# Patient Record
Sex: Female | Born: 1950
Health system: Southern US, Community
[De-identification: ages and names within clinical notes are randomized; demographics above are authoritative.]

## PROBLEM LIST (undated history)

## (undated) DIAGNOSIS — E109 Type 1 diabetes mellitus without complications: Secondary | ICD-10-CM

## (undated) DIAGNOSIS — B009 Herpesviral infection, unspecified: Secondary | ICD-10-CM

## (undated) DIAGNOSIS — Z800 Family history of malignant neoplasm of digestive organs: Secondary | ICD-10-CM

## (undated) DIAGNOSIS — E7800 Pure hypercholesterolemia, unspecified: Secondary | ICD-10-CM

## (undated) DIAGNOSIS — D126 Benign neoplasm of colon, unspecified: Secondary | ICD-10-CM

## (undated) DIAGNOSIS — K769 Liver disease, unspecified: Secondary | ICD-10-CM

## (undated) DIAGNOSIS — I10 Essential (primary) hypertension: Secondary | ICD-10-CM

## (undated) DIAGNOSIS — E785 Hyperlipidemia, unspecified: Secondary | ICD-10-CM

## (undated) DIAGNOSIS — Z780 Asymptomatic menopausal state: Secondary | ICD-10-CM

## (undated) DIAGNOSIS — M1990 Unspecified osteoarthritis, unspecified site: Secondary | ICD-10-CM

## (undated) HISTORY — DX: Asymptomatic menopausal state: Z78.0

## (undated) HISTORY — DX: Hyperlipidemia, unspecified: E78.5

## (undated) HISTORY — DX: Herpesviral infection, unspecified: B00.9

## (undated) HISTORY — DX: Essential (primary) hypertension: I10

## (undated) HISTORY — DX: Family history of malignant neoplasm of digestive organs: Z80.0

## (undated) HISTORY — DX: Benign neoplasm of colon, unspecified: D12.6

## (undated) HISTORY — DX: Pure hypercholesterolemia, unspecified: E78.00

## (undated) HISTORY — DX: Type 1 diabetes mellitus without complications: E10.9

## (undated) HISTORY — PX: ABDOMINAL HYSTERECTOMY: SHX81

## (undated) HISTORY — DX: Liver disease, unspecified: K76.9

## (undated) HISTORY — DX: Unspecified osteoarthritis, unspecified site: M19.90

---

## 1999-01-27 HISTORY — PX: CHOLECYSTECTOMY: SHX55

## 1999-07-10 ENCOUNTER — Other Ambulatory Visit: Admission: RE | Admit: 1999-07-10 | Discharge: 1999-07-10 | Payer: Self-pay | Admitting: *Deleted

## 2001-01-20 ENCOUNTER — Other Ambulatory Visit: Admission: RE | Admit: 2001-01-20 | Discharge: 2001-01-20 | Payer: Self-pay | Admitting: *Deleted

## 2002-01-16 ENCOUNTER — Other Ambulatory Visit: Admission: RE | Admit: 2002-01-16 | Discharge: 2002-01-16 | Payer: Self-pay | Admitting: *Deleted

## 2003-01-15 ENCOUNTER — Other Ambulatory Visit: Admission: RE | Admit: 2003-01-15 | Discharge: 2003-01-15 | Payer: Self-pay | Admitting: *Deleted

## 2004-01-02 ENCOUNTER — Ambulatory Visit: Payer: Self-pay | Admitting: Endocrinology

## 2004-01-24 ENCOUNTER — Other Ambulatory Visit: Admission: RE | Admit: 2004-01-24 | Discharge: 2004-01-24 | Payer: Self-pay | Admitting: *Deleted

## 2004-03-27 ENCOUNTER — Ambulatory Visit: Payer: Self-pay | Admitting: Endocrinology

## 2004-04-17 ENCOUNTER — Ambulatory Visit: Payer: Self-pay | Admitting: Endocrinology

## 2004-07-28 ENCOUNTER — Ambulatory Visit: Payer: Self-pay | Admitting: Endocrinology

## 2004-12-22 ENCOUNTER — Ambulatory Visit: Payer: Self-pay | Admitting: Endocrinology

## 2005-01-22 ENCOUNTER — Other Ambulatory Visit: Admission: RE | Admit: 2005-01-22 | Discharge: 2005-01-22 | Payer: Self-pay | Admitting: *Deleted

## 2005-01-30 ENCOUNTER — Ambulatory Visit: Payer: Self-pay | Admitting: Endocrinology

## 2005-03-26 ENCOUNTER — Ambulatory Visit: Payer: Self-pay | Admitting: Endocrinology

## 2005-12-04 ENCOUNTER — Ambulatory Visit: Payer: Self-pay | Admitting: Endocrinology

## 2006-02-09 ENCOUNTER — Ambulatory Visit: Payer: Self-pay | Admitting: Endocrinology

## 2006-02-09 LAB — CONVERTED CEMR LAB
AST: 25 units/L (ref 0–37)
Basophils Relative: 0.6 % (ref 0.0–1.0)
CO2: 26 meq/L (ref 19–32)
Cholesterol: 145 mg/dL (ref 0–200)
Creatinine, Ser: 0.8 mg/dL (ref 0.4–1.2)
Glucose, Bld: 235 mg/dL — ABNORMAL HIGH (ref 70–99)
HCT: 42 % (ref 36.0–46.0)
HDL: 67.7 mg/dL (ref >39.0)
Leukocytes, UA: NEGATIVE
Monocytes Absolute: 0.5 10*3/uL (ref 0.2–0.7)
Monocytes Relative: 9 % (ref 3.0–11.0)
Neutrophils Relative %: 64.9 % (ref 43.0–77.0)
Platelets: 302 10*3/uL (ref 150–400)
Potassium: 4.2 meq/L (ref 3.5–5.1)
Specific Gravity, Urine: >=1.03 (ref 1.000–1.03)
TSH: 2.44 microintl units/mL (ref 0.35–5.50)
Total Protein: 6.1 g/dL (ref 6.0–8.3)
VLDL: 12 mg/dL (ref 0–40)
WBC: 6 10*3/uL (ref 4.5–10.5)
pH: 6 (ref 5.0–8.0)

## 2006-02-10 ENCOUNTER — Ambulatory Visit: Payer: Self-pay | Admitting: Endocrinology

## 2006-02-10 LAB — CONVERTED CEMR LAB
Hgb A1c MFr Bld: 8.7 % — ABNORMAL HIGH (ref 4.6–6.0)
Microalb, Ur: 0.2 mg/dL (ref 0.0–1.9)

## 2006-05-14 ENCOUNTER — Ambulatory Visit: Payer: Self-pay | Admitting: Endocrinology

## 2006-05-14 LAB — CONVERTED CEMR LAB: Hgb A1c MFr Bld: 8.6 % — ABNORMAL HIGH (ref 4.6–6.0)

## 2006-05-19 ENCOUNTER — Ambulatory Visit: Payer: Self-pay | Admitting: Endocrinology

## 2006-08-24 ENCOUNTER — Encounter: Payer: Self-pay | Admitting: Endocrinology

## 2006-08-24 DIAGNOSIS — I10 Essential (primary) hypertension: Secondary | ICD-10-CM

## 2006-08-24 DIAGNOSIS — E109 Type 1 diabetes mellitus without complications: Secondary | ICD-10-CM | POA: Insufficient documentation

## 2006-08-24 HISTORY — DX: Essential (primary) hypertension: I10

## 2006-08-24 HISTORY — DX: Type 1 diabetes mellitus without complications: E10.9

## 2006-09-30 ENCOUNTER — Ambulatory Visit: Payer: Self-pay | Admitting: Endocrinology

## 2007-01-26 ENCOUNTER — Encounter: Payer: Self-pay | Admitting: Endocrinology

## 2007-04-08 ENCOUNTER — Ambulatory Visit: Payer: Self-pay | Admitting: Endocrinology

## 2007-04-08 LAB — CONVERTED CEMR LAB: Hgb A1c MFr Bld: 8.1 % — ABNORMAL HIGH (ref 4.6–6.0)

## 2007-04-18 ENCOUNTER — Encounter: Payer: Self-pay | Admitting: Endocrinology

## 2008-01-27 HISTORY — PX: ACHILLES TENDON REPAIR: SUR1153

## 2008-02-09 ENCOUNTER — Ambulatory Visit: Payer: Self-pay | Admitting: Endocrinology

## 2008-02-09 DIAGNOSIS — E7800 Pure hypercholesterolemia, unspecified: Secondary | ICD-10-CM

## 2008-02-09 HISTORY — DX: Pure hypercholesterolemia, unspecified: E78.00

## 2008-02-14 ENCOUNTER — Telehealth: Payer: Self-pay | Admitting: Endocrinology

## 2008-02-16 ENCOUNTER — Telehealth: Payer: Self-pay | Admitting: Endocrinology

## 2008-03-16 ENCOUNTER — Telehealth (INDEPENDENT_AMBULATORY_CARE_PROVIDER_SITE_OTHER): Payer: Self-pay | Admitting: *Deleted

## 2008-04-30 ENCOUNTER — Ambulatory Visit: Payer: Self-pay | Admitting: Endocrinology

## 2008-04-30 LAB — CONVERTED CEMR LAB
Alkaline Phosphatase: 70 units/L (ref 39–117)
Basophils Absolute: 0 10*3/uL (ref 0.0–0.1)
Bilirubin, Direct: 0.1 mg/dL (ref 0.0–0.3)
Creatinine, Ser: 0.8 mg/dL (ref 0.4–1.2)
Eosinophils Absolute: 0.3 10*3/uL (ref 0.0–0.7)
Eosinophils Relative: 3.7 % (ref 0.0–5.0)
GFR calc non Af Amer: 78.95 mL/min (ref >60)
HCT: 40.8 % (ref 36.0–46.0)
HDL: 66 mg/dL (ref >39.00)
Hemoglobin, Urine: NEGATIVE
Leukocytes, UA: NEGATIVE
MCHC: 33.6 g/dL (ref 30.0–36.0)
MCV: 90.8 fL (ref 78.0–100.0)
Monocytes Absolute: 0.6 10*3/uL (ref 0.1–1.0)
Neutro Abs: 4.9 10*3/uL (ref 1.4–7.7)
Platelets: 242 10*3/uL (ref 150.0–400.0)
RBC: 4.49 M/uL (ref 3.87–5.11)
Specific Gravity, Urine: >=1.03 (ref 1.000–1.030)
TSH: 1.71 microintl units/mL (ref 0.35–5.50)
VLDL: 19 mg/dL (ref 0.0–40.0)
WBC: 7 10*3/uL (ref 4.5–10.5)
pH: 5.5 (ref 5.0–8.0)

## 2008-05-02 ENCOUNTER — Ambulatory Visit: Payer: Self-pay | Admitting: Endocrinology

## 2008-07-11 ENCOUNTER — Encounter: Payer: Self-pay | Admitting: Endocrinology

## 2008-08-06 ENCOUNTER — Ambulatory Visit: Payer: Self-pay | Admitting: Gastroenterology

## 2008-09-06 ENCOUNTER — Encounter: Payer: Self-pay | Admitting: Gastroenterology

## 2008-09-06 ENCOUNTER — Ambulatory Visit: Payer: Self-pay | Admitting: Gastroenterology

## 2008-09-10 ENCOUNTER — Encounter: Payer: Self-pay | Admitting: Gastroenterology

## 2009-03-13 ENCOUNTER — Encounter: Payer: Self-pay | Admitting: Endocrinology

## 2009-03-13 ENCOUNTER — Ambulatory Visit: Payer: Self-pay | Admitting: Endocrinology

## 2009-03-13 DIAGNOSIS — R059 Cough, unspecified: Secondary | ICD-10-CM | POA: Insufficient documentation

## 2009-03-13 DIAGNOSIS — R05 Cough: Secondary | ICD-10-CM

## 2009-03-13 DIAGNOSIS — B009 Herpesviral infection, unspecified: Secondary | ICD-10-CM

## 2009-03-13 HISTORY — DX: Herpesviral infection, unspecified: B00.9

## 2009-03-13 LAB — CONVERTED CEMR LAB: Hgb A1c MFr Bld: 8.7 % — ABNORMAL HIGH (ref 4.6–6.5)

## 2009-03-14 ENCOUNTER — Telehealth (INDEPENDENT_AMBULATORY_CARE_PROVIDER_SITE_OTHER): Payer: Self-pay | Admitting: *Deleted

## 2009-03-21 ENCOUNTER — Ambulatory Visit: Payer: Self-pay | Admitting: Endocrinology

## 2009-04-11 ENCOUNTER — Ambulatory Visit: Payer: Self-pay | Admitting: Endocrinology

## 2009-04-12 ENCOUNTER — Encounter: Payer: Self-pay | Admitting: Endocrinology

## 2009-05-21 ENCOUNTER — Ambulatory Visit: Payer: Self-pay | Admitting: Endocrinology

## 2009-05-21 DIAGNOSIS — Z780 Asymptomatic menopausal state: Secondary | ICD-10-CM | POA: Insufficient documentation

## 2009-05-21 HISTORY — DX: Asymptomatic menopausal state: Z78.0

## 2009-08-13 ENCOUNTER — Encounter: Payer: Self-pay | Admitting: Endocrinology

## 2009-08-13 ENCOUNTER — Ambulatory Visit: Payer: Self-pay | Admitting: Internal Medicine

## 2009-09-27 ENCOUNTER — Encounter: Payer: Self-pay | Admitting: Endocrinology

## 2009-11-08 ENCOUNTER — Encounter: Payer: Self-pay | Admitting: Endocrinology

## 2009-12-06 ENCOUNTER — Ambulatory Visit: Payer: Self-pay | Admitting: Endocrinology

## 2010-01-23 ENCOUNTER — Encounter: Payer: Self-pay | Admitting: Endocrinology

## 2010-02-10 ENCOUNTER — Encounter: Payer: Self-pay | Admitting: Endocrinology

## 2010-02-11 ENCOUNTER — Encounter: Payer: Self-pay | Admitting: Endocrinology

## 2010-02-23 LAB — CONVERTED CEMR LAB
ALT: 25 units/L (ref 0–35)
AST: 24 units/L (ref 0–37)
Alkaline Phosphatase: 101 units/L (ref 39–117)
BUN: 15 mg/dL (ref 6–23)
Basophils Absolute: 0 10*3/uL (ref 0.0–0.1)
Bilirubin, Direct: 0.1 mg/dL (ref 0.0–0.3)
Calcium: 9.4 mg/dL (ref 8.4–10.5)
Creatinine, Ser: 0.7 mg/dL (ref 0.4–1.2)
Creatinine,U: 105 mg/dL
Direct LDL: 130.2 mg/dL
GFR calc non Af Amer: 91.76 mL/min (ref >60)
Glucose, Bld: 104 mg/dL — ABNORMAL HIGH (ref 70–99)
HDL: 77.4 mg/dL (ref >39.00)
Hemoglobin: 13.9 g/dL (ref 12.0–15.0)
Ketones, ur: NEGATIVE mg/dL
Leukocytes, UA: NEGATIVE
Lymphocytes Relative: 22.3 % (ref 12.0–46.0)
Lymphs Abs: 1.5 10*3/uL (ref 0.7–4.0)
MCHC: 34.4 g/dL (ref 30.0–36.0)
Microalb Creat Ratio: 2.9 mg/g (ref 0.0–30.0)
Monocytes Absolute: 0.6 10*3/uL (ref 0.1–1.0)
Neutro Abs: 4.3 10*3/uL (ref 1.4–7.7)
Potassium: 4.9 meq/L (ref 3.5–5.1)
RBC: 4.52 M/uL (ref 3.87–5.11)
Specific Gravity, Urine: 1.025 (ref 1.000–1.030)
Total Bilirubin: 0.6 mg/dL (ref 0.3–1.2)
Triglycerides: 137 mg/dL (ref 0.0–149.0)
Urobilinogen, UA: 0.2 (ref 0.0–1.0)
VLDL: 27.4 mg/dL (ref 0.0–40.0)
WBC: 6.8 10*3/uL (ref 4.5–10.5)
pH: 6.5 (ref 5.0–8.0)

## 2010-02-27 NOTE — Letter (Signed)
Summary: [facility]   Imported By: Lennie Odor 11/14/2009 11:52:26  _____________________________________________________________________  External Attachment:    Type:   Image     Comment:   External Document

## 2010-02-27 NOTE — Assessment & Plan Note (Signed)
Summary: 2-3 wk fu--per pt late pm/work---stc   Vital Signs:  Patient profile:   60 year old female Height:      65 inches (165.10 cm) Weight:      265.13 pounds (120.51 kg) O2 Sat:      97 % on Room air Temp:     97.5 degrees F (36.39 degrees C) oral Pulse rate:   83 / minute BP sitting:   140 / 80  (left arm) Cuff size:   large  Vitals Entered By: Josph Macho RMA (April 11, 2009 3:56 PM)  O2 Flow:  Room air CC: 2-3 week follow up/ pt states she does not use the freestyle test strips, and is not taking Tamiflu or Hydrocodone anymore/ CF Is Patient Diabetic? Yes   Referring Provider:  Romero Belling, MD Primary Provider:  Romero Belling, MD  CC:  2-3 week follow up/ pt states she does not use the freestyle test strips and and is not taking Tamiflu or Hydrocodone anymore/ CF.  History of Present Illness: pt states she feels well in general.  she brings a record of her cbg's which i have reviewed today.  she has frequest mild hypoglycemia before lunch, and before evening meal.  it is still highest in am, when it is sometimes over 200.  Current Medications (verified): 1)  Humalog 100 Unit/ml  Soln (Insulin Lispro (Human)) .... Three Times A Day (Just Before Each Meal) 35-15 25 Units 2)  Adult Aspirin Low Strength 81 Mg  Tbdp (Aspirin) .... Take 1 By Mouth Once Daily 3)  Lisinopril 5 Mg  Tabs (Lisinopril) .... Take 1 By Mouth Once Daily 4)  Lantus 100 Unit/ml  Soln (Insulin Glargine) .... 25 Units Two Times A Day 5)  Freestyle Test   Strp (Glucose Blood) .... Use A Strip To Check Blood Sugars Qid 6)  Accu-Chek Comfort Curve   Strp (Glucose Blood) .... Use 1 Strip To Check Glucose Qid Qd 7)  Simvastatin 80 Mg Tabs (Simvastatin) .... One Tablet By Mouth At Bedtime 8)  Colestipol Hcl 1 Gm Tabs (Colestipol Hcl) .... 3 Tabs Once Daily With Meals 9)  Lotrisone 1-0.05 % Crea (Clotrimazole-Betamethasone) .... Three Times A Day As Needed Rash 10)  Tamiflu 11)  Diclofenac Sodium 75 Mg Tbec  (Diclofenac Sodium) .Marland Kitchen.. 1 Tab By Mouth Two Times A Day 12)  Hydrocodone-Homatropine 5-1.5 Mg/45ml Syrp (Hydrocodone-Homatropine) .Marland Kitchen.. 1 Teaspoon By Mouth 4 Times A Day  Allergies (verified): 1)  ! * Actos  Past History:  Past Medical History: Last updated: 08/06/2008 Diabetes mellitus, type II Hypertension Dyslipidemia  Arthritis Hyperlipidemia  Review of Systems  The patient denies syncope.    Physical Exam  General:  obese.   Skin:  insulin injection sites at anterior abdomen are normal    Impression & Recommendations:  Problem # 1:  DIABETES MELLITUS, TYPE I (ICD-250.01) needs adjustments.  Medications Added to Medication List This Visit: 1)  Humalog 100 Unit/ml Soln (Insulin lispro (human)) .... Three times a day (just before each meal) 30-10-30 units 2)  Lantus 100 Unit/ml Soln (Insulin glargine) .... 50 units at bedtime 3)  Accu-chek Comfort Curve Strp (Glucose blood) .... Three times a day, and lancets 250.01 4)  Bd Insulin Syr Ultrafine Ii 31g X 5/16" 0.5 Ml Misc (Insulin syringe-needle u-100) .... Any brand, 4/day  Other Orders: Est. Patient Level III (27782)  Patient Instructions: 1)  reduce humalog to (just before each meal) 30-10-30 units 2)  change lantus to 50 units at  night. 3)  check your blood sugar 2 times a day.  vary the time of day when you check, between before the 3 meals, and at bedtime.  also check if you have symptoms of your blood sugar being too high or too low.  please keep a record of the readings and bring it to your next appointment here.  please call us sooner if you are having low blood sugar episodes.  4)  physical in 1-2 months, with blood tests same day--i'll order. Prescriptions: LOTRISONE 1-0.05 % CREA (CLOTRIMAZOLE-BETAMETHASONE) three times a day as needed rash  #1 lg tube x 3   Entered and Authorized by:   Minus Breeding MD   Signed by:   Minus Breeding MD on 04/11/2009   Method used:   Electronically to        [facility]* (mail-order)             ,          Ph: [phone]       Fax: [phone]   RxID:   [id number] BD INSULIN SYR ULTRAFINE II 31G X 5/16" 0.5 ML MISC (INSULIN SYRINGE-NEEDLE U-100) any brand, 4/day  #360 x 3   Entered and Authorized by:   Minus Breeding MD   Signed by:   Minus Breeding MD on 04/11/2009   Method used:   Electronically to        [facility]* (mail-order)             ,          Ph: [phone]       Fax: [phone]   RxID:   [id number] DICLOFENAC SODIUM 75 MG TBEC (DICLOFENAC SODIUM) 1 tab by mouth two times a day  #180 x 3   Entered and Authorized by:   Minus Breeding MD   Signed by:   Minus Breeding MD on 04/11/2009   Method used:   Electronically to        [facility]* (mail-order)             ,          Ph: [phone]       Fax: [phone]   RxID:   [id number] COLESTIPOL HCL 1 GM TABS (COLESTIPOL HCL) 3 tabs once daily with meals  #270 x 3   Entered and Authorized by:   Minus Breeding MD   Signed by:   Minus Breeding MD on 04/11/2009   Method used:   Electronically to        [facility]* (mail-order)             ,          Ph: [phone]       Fax: [phone]   RxID:   [id number] SIMVASTATIN 80 MG TABS (SIMVASTATIN) one tablet by mouth at bedtime  #90 x 3   Entered and Authorized by:   Minus Breeding MD   Signed by:   Minus Breeding MD on 04/11/2009   Method used:   Electronically to        [facility]* (mail-order)             ,          Ph: [phone]       Fax: [phone]   RxID:   [id number] ACCU-CHEK COMFORT CURVE   STRP (GLUCOSE BLOOD) three times a day, and lancets 250.01  #  270 x 3   Entered and Authorized by:   Minus Breeding MD   Signed by:   Minus Breeding MD on 04/11/2009   Method used:   Electronically to        [facility]* (mail-order)             ,          Ph: [phone]       Fax: [phone]   RxID:   [id number] LANTUS 100 UNIT/ML  SOLN (INSULIN GLARGINE) 50 units  at bedtime  #5 vials x 3   Entered and Authorized by:   Minus Breeding MD   Signed by:   Minus Breeding MD on 04/11/2009   Method used:   Electronically to        [facility]* (mail-order)             ,          Ph: [phone]       Fax: [phone]   RxID:   [id number] LISINOPRIL 5 MG  TABS (LISINOPRIL) take 1 by mouth once daily  #90 x 3   Entered and Authorized by:   Minus Breeding MD   Signed by:   Minus Breeding MD on 04/11/2009   Method used:   Electronically to        [facility]* (mail-order)             ,          Ph: [phone]       Fax: [phone]   RxID:   [id number] HUMALOG 100 UNIT/ML  SOLN (INSULIN LISPRO (HUMAN)) three times a day (just before each meal) 30-10-30 units  #7 vials x 3   Entered and Authorized by:   Minus Breeding MD   Signed by:   Minus Breeding MD on 04/11/2009   Method used:   Electronically to        [facility]* (mail-order)             ,          Ph: [phone]       Fax: [phone]   RxID:   [id number]

## 2010-02-27 NOTE — Miscellaneous (Signed)
[facility]   Imported By: Lester Blevins 04/19/2009 10:22:07  _____________________________________________________________________  External Attachment:    Type:   Image     Comment:   External Document

## 2010-02-27 NOTE — Miscellaneous (Signed)
Summary: BONE DENSITY  Clinical Lists Changes  Orders: Added new Test order of T-Lumbar Vertebral Assessment (77082) - Signed 

## 2010-02-27 NOTE — Miscellaneous (Signed)
  Medications Added ACYCLOVIR 200 MG CAPS (ACYCLOVIR) 4 tabs 5x a day       Clinical Lists Changes  Problems: Added new problem of HERPES LABIALIS (ICD-054.9) Medications: Added new medication of ACYCLOVIR 200 MG CAPS (ACYCLOVIR) 4 tabs 5x a day - Signed Rx of ACYCLOVIR 200 MG CAPS (ACYCLOVIR) 4 tabs 5x a day;  #90 x 0;  Signed;  Entered by: Minus Breeding MD;  Authorized by: Minus Breeding MD;  Method used: Electronically to [facility] [address], [place], [place], [place]  [address], Ph: [phone] or [phone], Fax: [phone]    Prescriptions: ACYCLOVIR 200 MG CAPS (ACYCLOVIR) 4 tabs 5x a day  #90 x 0   Entered and Authorized by:   Minus Breeding MD   Signed by:   Minus Breeding MD on 03/13/2009   Method used:   Electronically to        [facility] [address] (retail)       [address]       [place], [place]  [address]       Ph: [phone] or [phone]       Fax: [phone]   RxID:   [id number]

## 2010-02-27 NOTE — Assessment & Plan Note (Signed)
Summary: FU--O K / DRIVER'S LICENSE FORM--STC   Vital Signs:  Patient profile:   60 year old female Height:      65 inches (165.10 cm) Weight:      260 pounds (118.18 kg) O2 Sat:      96 % on Room air Temp:     98.4 degrees F (36.89 degrees C) oral Pulse rate:   101 / minute BP sitting:   142 / 62  (left arm) Cuff size:   large  Vitals Entered By: Josph Macho RMA (March 13, 2009 9:29 AM)  O2 Flow:  Room air CC: Follow-up visit/ pt has papers for DMV/ pt states her throat has been sore since Sunday/ CF Is Patient Diabetic? Yes   Referring Provider:  Romero Belling, MD Primary Provider:  Romero Belling, MD  CC:  Follow-up visit/ pt has papers for DMV/ pt states her throat has been sore since Sunday/ CF.  History of Present Illness: no cbg record, but states cbg's are "high"--highest before supper, and lowest at lunch.  she says it is mostly 200-300's. pt states few mos of nasal congestion, and asociated pain at both ears.  she went to urgent care 3d ago, and was rx'ed tamiflu and cough syrup.  Current Medications (verified): 1)  Humalog 100 Unit/ml  Soln (Insulin Lispro (Human)) .... Qac 4 Times A Day) 25-20-25-5 Units 2)  Adult Aspirin Low Strength 81 Mg  Tbdp (Aspirin) .... Take 1 By Mouth Once Daily 3)  Lisinopril 5 Mg  Tabs (Lisinopril) .... Take 1 By Mouth Once Daily 4)  Lantus 100 Unit/ml  Soln (Insulin Glargine) .... 25 Units Bid 5)  Freestyle Test   Strp (Glucose Blood) .... Use A Strip To Check Blood Sugars Qid 6)  Accu-Chek Comfort Curve   Strp (Glucose Blood) .... Use 1 Strip To Check Glucose Qid Qd 7)  Simvastatin 80 Mg Tabs (Simvastatin) .... One Tablet By Mouth At Bedtime 8)  Colestipol Hcl 1 Gm Tabs (Colestipol Hcl) .... 3 Tabs Once Daily With Meals 9)  Lotrisone 1-0.05 % Crea (Clotrimazole-Betamethasone) .... Three Times A Day As Needed Rash 10)  Tamiflu 11)  Diclofenac Sodium 75 Mg Tbec (Diclofenac Sodium) .Marland Kitchen.. 1 Tab By Mouth Two Times A Day 12)   Hydrocodone-Homatropine 5-1.5 Mg/82ml Syrp (Hydrocodone-Homatropine) .Marland Kitchen.. 1 Teaspoon By Mouth 4 Times A Day  Allergies (verified): 1)  ! * Actos  Past History:  Past Medical History: Last updated: 08/06/2008 Diabetes mellitus, type II Hypertension Dyslipidemia  Arthritis Hyperlipidemia  Review of Systems  The patient denies dyspnea on exertion.         she has dry cough.  fever is better.  no hypoglycemia.  Physical Exam  General:  morbidly obese.  no distress  Head:  head: no deformity eyes: no periorbital swelling, no proptosis external nose and ears are normal mouth: no lesion seen Ears:  both tm's are very red Neck:  supple Lungs:  Clear to auscultation bilaterally. Normal respiratory effort.    Impression & Recommendations:  Problem # 1:  uri  Problem # 2:  DIABETES MELLITUS, TYPE I (ICD-250.01) needs increased rx  Problem # 3:  HYPERTENSION (ICD-401.9) with ? of situational component  Medications Added to Medication List This Visit: 1)  Humalog 100 Unit/ml Soln (Insulin lispro (human)) .... 35 units three times a day (just before each meal) 2)  Tamiflu  3)  Diclofenac Sodium 75 Mg Tbec (Diclofenac sodium) .Marland Kitchen.. 1 tab by mouth two times a day 4)  Hydrocodone-homatropine 5-1.5 Mg/40ml Syrp (Hydrocodone-homatropine) .Marland Kitchen.. 1 teaspoon by mouth 4 times a day 5)  Cefuroxime Axetil 250 Mg Tabs (Cefuroxime axetil) .Marland Kitchen.. 1 two times a day  Other Orders: T-2 View CXR (71020TC) TLB-A1C / Hgb A1C (Glycohemoglobin) (83036-A1C) Est. Patient Level IV ([address])  Patient Instructions: 1)  cefuroxime 250 mg two times a day. 2)  chest x ray and blood test today. 3)  continue cough syrup. 4)  increase humalog to 35 units three times a day (just before each meal).   5)  same lantus. 6)  check your blood sugar 2 times a day.  vary the time of day when you check, between before the 3 meals, and at bedtime.  also check if you have symptoms of your blood sugar being too high or too  low.  please keep a record of the readings and bring it to your next appointment here.  please call us sooner if you are having low blood sugar episodes. 7)  return 1-2 weeks. 8)  we'll recheck blood pressure then. 9)  loratadine-d (non-prescription) as needed for congestion. Prescriptions: CEFUROXIME AXETIL 250 MG TABS (CEFUROXIME AXETIL) 1 two times a day  #14 x 0   Entered and Authorized by:   Minus Breeding MD   Signed by:   Minus Breeding MD on 03/13/2009   Method used:   Electronically to        [facility] [address] (retail)       [address]       [place], [place]  [address]       Ph: [phone] or [phone]       Fax: [phone]   RxID:   [id number]   Preventive Care Screening  Last Flu Shot:    Date:  10/26/2008    Results:  historical

## 2010-02-27 NOTE — Miscellaneous (Signed)
Summary: BONE DENSITY  Clinical Lists Changes 

## 2010-02-27 NOTE — Assessment & Plan Note (Signed)
Summary: flu shot/sae/cd---8am appt/cd   Nurse Visit   Allergies: 1)  ! * Actos  Orders Added: 1)  Admin 1st Vaccine [90471] 2)  Flu Vaccine 22yrs + [65784]   Flu Vaccine Consent Questions     Do you have a history of severe allergic reactions to this vaccine? no    Any prior history of allergic reactions to egg and/or gelatin? no    Do you have a sensitivity to the preservative Thimersol? no    Do you have a past history of Guillan-Barre Syndrome? no    Do you currently have an acute febrile illness? no    Have you ever had a severe reaction to latex? no    Vaccine information given and explained to patient? yes    Are you currently pregnant? no    Lot Number:[id number]   Exp Date:07/26/2010   Site Given  Left Deltoid IM

## 2010-02-27 NOTE — Assessment & Plan Note (Signed)
Summary: 1-2 WK FU---STC   Vital Signs:  Patient profile:   60 year old female Height:      65 inches (165.10 cm) Weight:      259 pounds (117.73 kg) O2 Sat:      98 % on Room air Temp:     96.2 degrees F (35.67 degrees C) oral Pulse rate:   84 / minute BP sitting:   138 / 86  (left arm) Cuff size:   large  Vitals Entered By: Josph Macho RMA (March 21, 2009 3:42 PM)  O2 Flow:  Room air CC: 1-2 week follow up/ CF Is Patient Diabetic? Yes   Referring Provider:  Romero Belling, MD Primary Provider:  Romero Belling, MD  CC:  1-2 week follow up/ CF.  History of Present Illness: pt says she is feeling much better since her recent respiratory infection.  she had hypoglycemia in the afternoon with 35 units humalog (just before each meal).  she reduced to (just before each meal) 30-15-30 units.  on this schedule, she has not had any more hypoglycemia.  Current Medications (verified): 1)  Humalog 100 Unit/ml  Soln (Insulin Lispro (Human)) .... 35 Units Three Times A Day (Just Before Each Meal) 2)  Adult Aspirin Low Strength 81 Mg  Tbdp (Aspirin) .... Take 1 By Mouth Once Daily 3)  Lisinopril 5 Mg  Tabs (Lisinopril) .... Take 1 By Mouth Once Daily 4)  Lantus 100 Unit/ml  Soln (Insulin Glargine) .... 25 Units Bid 5)  Freestyle Test   Strp (Glucose Blood) .... Use A Strip To Check Blood Sugars Qid 6)  Accu-Chek Comfort Curve   Strp (Glucose Blood) .... Use 1 Strip To Check Glucose Qid Qd 7)  Simvastatin 80 Mg Tabs (Simvastatin) .... One Tablet By Mouth At Bedtime 8)  Colestipol Hcl 1 Gm Tabs (Colestipol Hcl) .... 3 Tabs Once Daily With Meals 9)  Lotrisone 1-0.05 % Crea (Clotrimazole-Betamethasone) .... Three Times A Day As Needed Rash 10)  Tamiflu 11)  Diclofenac Sodium 75 Mg Tbec (Diclofenac Sodium) .Marland Kitchen.. 1 Tab By Mouth Two Times A Day 12)  Hydrocodone-Homatropine 5-1.5 Mg/69ml Syrp (Hydrocodone-Homatropine) .Marland Kitchen.. 1 Teaspoon By Mouth 4 Times A Day 13)  Cefuroxime Axetil 250 Mg Tabs  (Cefuroxime Axetil) .Marland Kitchen.. 1 Two Times A Day 14)  Acyclovir 200 Mg Caps (Acyclovir) .... 4 Tabs 5x A Day  Allergies (verified): 1)  ! * Actos  Past History:  Past Medical History: Last updated: 08/06/2008 Diabetes mellitus, type II Hypertension Dyslipidemia  Arthritis Hyperlipidemia  Review of Systems  The patient denies hypoglycemia.    Physical Exam  General:  obese.  no distress.   Psych:  Alert and cooperative; normal mood and affect; normal attention span and concentration.     Impression & Recommendations:  Problem # 1:  DIABETES MELLITUS, TYPE I (ICD-250.01) Assessment Improved  Medications Added to Medication List This Visit: 1)  Humalog 100 Unit/ml Soln (Insulin lispro (human)) .... Three times a day (just before each meal) 35-15 25 units 2)  Lantus 100 Unit/ml Soln (Insulin glargine) .... 25 units two times a day  Other Orders: Est. Patient Level III ([address])  Patient Instructions: 1)  change humalog to (just before each meal) 35-15-25 units 2)  for now, continue lantus 25 units 2x a day. 3)  check your blood sugar 2 times a day.  vary the time of day when you check, between before the 3 meals, and at bedtime.  also check if you have symptoms of  your blood sugar being too high or too low.  please keep a record of the readings and bring it to your next appointment here.  please call us sooner if you are having low blood sugar episodes.  it is very important to check more at bedtime. 4)  return 2-3 weeks. 5)  i told pt we will need to take this complex situation in stages.

## 2010-02-27 NOTE — Progress Notes (Signed)
Summary: DMV  Phone Note Outgoing Call   Summary of Call: Mailed completed paperwork to [employer] [facility] [employer]. Sent a copy to be scanned also. Initial call taken by: Josph Macho RMA,  March 14, 2009 9:10 AM

## 2010-02-27 NOTE — Miscellaneous (Signed)
Summary: FREESTYLE STRIPS  Medications Added FREESTYLE TEST   STRP (GLUCOSE BLOOD) USE A STRIP TO CHECK BLOOD SUGARS QID       Clinical Lists Changes  Medications: Added new medication of FREESTYLE TEST   STRP (GLUCOSE BLOOD) USE A STRIP TO CHECK BLOOD SUGARS QID - Signed Rx of FREESTYLE TEST   STRP (GLUCOSE BLOOD) USE A STRIP TO CHECK BLOOD SUGARS QID;  #150 x 6;  Signed;  Entered by: Orlan Leavens;  Authorized by: Minus Breeding MD;  Method used: Electronic    Prescriptions: FREESTYLE TEST   STRP (GLUCOSE BLOOD) USE A STRIP TO CHECK BLOOD SUGARS QID  #150 x 6   Entered by:   Orlan Leavens   Authorized by:   Minus Breeding MD   Signed by:   Orlan Leavens on 01/26/2007   Method used:   Electronically sent to ...       [facility] [address]       [address]       [place], [place]  [address]       Ph: [phone] or [phone]       Fax: [phone]   RxID:   [id number]

## 2010-02-27 NOTE — Assessment & Plan Note (Signed)
Summary: 1-2 MTH PHYSICAL  D/T--STC   Vital Signs:  Patient profile:   60 year old female Height:      65 inches (165.10 cm) Weight:      267.38 pounds (121.54 kg) BMI:     44.66 O2 Sat:      97 % on Room air Temp:     98.6 degrees F (37.00 degrees C) oral Pulse rate:   94 / minute BP sitting:   138 / 78  (left arm) Cuff size:   large  Vitals Entered By: Josph Macho RMA (May 21, 2009 9:09 AM)  O2 Flow:  Room air CC: 1-2 month physical/ CF Is Patient Diabetic? Yes   Referring Provider:  Romero Belling, MD Primary Provider:  Romero Belling, MD  CC:  1-2 month physical/ CF.  History of Present Illness: here for regular wellness examination.  she's feeling pretty well in general, and does not drink or smoke.   Current Medications (verified): 1)  Humalog 100 Unit/ml  Soln (Insulin Lispro (Human)) .... Three Times A Day (Just Before Each Meal) 30-10-30 Units 2)  Adult Aspirin Low Strength 81 Mg  Tbdp (Aspirin) .... Take 1 By Mouth Once Daily 3)  Lisinopril 5 Mg  Tabs (Lisinopril) .... Take 1 By Mouth Once Daily 4)  Lantus 100 Unit/ml  Soln (Insulin Glargine) .... 50 Units At Bedtime 5)  Accu-Chek Comfort Curve   Strp (Glucose Blood) .... Three Times A Day, and Lancets 250.01 6)  Simvastatin 80 Mg Tabs (Simvastatin) .... One Tablet By Mouth At Bedtime 7)  Colestipol Hcl 1 Gm Tabs (Colestipol Hcl) .... 3 Tabs Once Daily With Meals 8)  Lotrisone 1-0.05 % Crea (Clotrimazole-Betamethasone) .... Three Times A Day As Needed Rash 9)  Diclofenac Sodium 75 Mg Tbec (Diclofenac Sodium) .Marland Kitchen.. 1 Tab By Mouth Two Times A Day 10)  Bd Insulin Syr Ultrafine Ii 31g X 5/16" 0.5 Ml Misc (Insulin Syringe-Needle U-100) .... Any Brand, 4/day  Allergies (verified): 1)  ! * Actos  Past History:  Past Medical History: Last updated: 08/06/2008 Diabetes mellitus, type II Hypertension Dyslipidemia  Arthritis Hyperlipidemia  Review of Systems       denies hypoglycemia  Physical  Exam  General:  obese.  no distress  Head:  head: no deformity eyes: no periorbital swelling, no proptosis external nose and ears are normal mouth: no lesion seen Neck:  Supple without thyroid enlargement or tenderness.  Breasts:  sees gyn  Lungs:  Clear to auscultation bilaterally. Normal respiratory effort.  Heart:  Regular rate and rhythm without murmurs or gallops noted. Normal S1,S2.   Abdomen:  abdomen is soft, nontender.  no hepatosplenomegaly.   not distended.  no hernia  Rectal:  sees gyn  Genitalia:  sees gyn  Msk:  muscle bulk and strength are grossly normal.  no obvious joint swelling.  gait is normal and steady  Neurologic:  sensation is intact to touch on the feet  Skin:  normal texture and temp.  no rash.  not diaphoretic  Cervical Nodes:  No significant adenopathy.  Psych:  Alert and cooperative; normal mood and affect; normal attention span and concentration.   Additional Exam:  SEPARATE EVALUATION FOLLOWS--EACH PROBLEM HERE IS NEW, NOT RESPONDING TO TREATMENT, OR POSES SIGNIFICANT RISK TO THE PATIENT'S HEALTH: HISTORY OF THE PRESENT ILLNESS: no cbg record, but states cbg's are sometimes low before lunch, so she reduce the breakfast humalog to 25 units.  she says it is highest in am, but does not  check at hs. PAST MEDICAL HISTORY reviewed and up to date today REVIEW OF SYSTEMS: denies hypoglycemia PHYSICAL EXAMINATION: dorsalis pedis intact bilat.  no carotid bruit no deformity.  no ulcer on the feet.  feet are of normal color and temp.  no edema sensation is intact to touch on the feet LAB/XRAY RESULTS: Hemoglobin A1C       [H]  8.1 %   Cholesterol LDL      130.2 mg/dL IMPRESSION: dyslipidemia, needs increased rx dm, needs increased rx PLAN: see instruction sheet    Impression & Recommendations:  Problem # 1:  ROUTINE GENERAL MEDICAL EXAM@HEALTH  CARE FACL (ICD-V70.0)  Medications Added to Medication List This Visit: 1)  Humalog 100 Unit/ml Soln  (Insulin lispro (human)) .... Three times a day (just before each meal) 25-10-35 units  Other Orders: EKG w/ Interpretation (93000) T-Bone Densitometry (04540) Pneumococcal Vaccine (98119) Admin 1st Vaccine (14782) TLB-Lipid Panel (80061-LIPID) TLB-BMP (Basic Metabolic Panel-BMET) (80048-METABOL) TLB-CBC Platelet - w/Differential (85025-CBCD) TLB-Hepatic/Liver Function Pnl (80076-HEPATIC) TLB-TSH (Thyroid Stimulating Hormone) (84443-TSH) TLB-A1C / Hgb A1C (Glycohemoglobin) (83036-A1C) TLB-Microalbumin/Creat Ratio, Urine (82043-MALB) TLB-Udip w/ Micro (81001-URINE) Est. Patient Level III (95621) Est. Patient 40-64 years (30865)  Preventive Care Screening     gyn is dr Associate Professor   Patient Instructions: 1)  tests are being ordered for you today.  a few days after the test(s), please call [phone] to hear your test results. 2)  pending the test results, please take humalog (just before each meal) 25-10-35 units. 3)  please consider these measures for your health:  minimize alcohol.  do not use tobacco products.  keep firearms safely stored.  always use seat belts.  have working smoke alarms in your home.  see the dentist regularly.  never drive under the influence of alcohol or drugs (including prescription drugs).  those with fair skin should take precautions against the sun. 4)  return 3 months. 5)  (update: i left message on phone-tree:  please verify you are on the zocor.  call me if so.  check cbg's at hs also, to see how we can increase insulin).   Orders Added: 1)  EKG w/ Interpretation [93000] 2)  T-Bone Densitometry [77080] 3)  Pneumococcal Vaccine [90732] 4)  Admin 1st Vaccine [90471] 5)  TLB-Lipid Panel [80061-LIPID] 6)  TLB-BMP (Basic Metabolic Panel-BMET) [80048-METABOL] 7)  TLB-CBC Platelet - w/Differential [85025-CBCD] 8)  TLB-Hepatic/Liver Function Pnl [80076-HEPATIC] 9)  TLB-TSH (Thyroid Stimulating Hormone) [84443-TSH] 10)  TLB-A1C / Hgb A1C (Glycohemoglobin)  [83036-A1C] 11)  TLB-Microalbumin/Creat Ratio, Urine [82043-MALB] 12)  TLB-Udip w/ Micro [81001-URINE] 13)  Est. Patient Level III [95284] 14)  Est. Patient 40-64 years [99396]    Immunizations Administered:  Pneumonia Vaccine:    Vaccine Type: Pneumovax    Site: right deltoid    Mfr: Merck    Dose: 0.5 ml    Route: IM    Given by: Josph Macho RMA    Exp. Date: 09/07/2010    Lot #: [id number]    VIS given: 08/24/95 version given May 21, 2009.

## 2010-02-27 NOTE — Consult Note (Signed)
Summary: [facility]   Imported By: Lester [place] 10/07/2009 09:09:25  _____________________________________________________________________  External Attachment:    Type:   Image     Comment:   External Document

## 2010-03-05 ENCOUNTER — Encounter: Payer: Self-pay | Admitting: Endocrinology

## 2010-03-07 ENCOUNTER — Telehealth: Payer: Self-pay | Admitting: Endocrinology

## 2010-03-10 ENCOUNTER — Telehealth: Payer: Self-pay | Admitting: Endocrinology

## 2010-03-17 ENCOUNTER — Encounter (INDEPENDENT_AMBULATORY_CARE_PROVIDER_SITE_OTHER): Payer: Self-pay | Admitting: *Deleted

## 2010-03-17 ENCOUNTER — Ambulatory Visit (INDEPENDENT_AMBULATORY_CARE_PROVIDER_SITE_OTHER): Payer: Managed Care, Other (non HMO) | Admitting: Endocrinology

## 2010-03-17 ENCOUNTER — Other Ambulatory Visit: Payer: Self-pay | Admitting: Endocrinology

## 2010-03-17 ENCOUNTER — Encounter: Payer: Self-pay | Admitting: Endocrinology

## 2010-03-17 ENCOUNTER — Other Ambulatory Visit: Payer: Managed Care, Other (non HMO)

## 2010-03-17 DIAGNOSIS — E109 Type 1 diabetes mellitus without complications: Secondary | ICD-10-CM

## 2010-03-17 DIAGNOSIS — K769 Liver disease, unspecified: Secondary | ICD-10-CM

## 2010-03-17 DIAGNOSIS — Z79899 Other long term (current) drug therapy: Secondary | ICD-10-CM

## 2010-03-17 DIAGNOSIS — Z0000 Encounter for general adult medical examination without abnormal findings: Secondary | ICD-10-CM

## 2010-03-17 DIAGNOSIS — E7800 Pure hypercholesterolemia, unspecified: Secondary | ICD-10-CM

## 2010-03-17 DIAGNOSIS — E785 Hyperlipidemia, unspecified: Secondary | ICD-10-CM

## 2010-03-17 DIAGNOSIS — I10 Essential (primary) hypertension: Secondary | ICD-10-CM

## 2010-03-17 HISTORY — DX: Liver disease, unspecified: K76.9

## 2010-03-17 LAB — URINALYSIS, ROUTINE W REFLEX MICROSCOPIC
Leukocytes, UA: NEGATIVE
Nitrite: NEGATIVE
Specific Gravity, Urine: >=1.03 (ref 1.000–1.030)
Total Protein, Urine: NEGATIVE
pH: 5.5 (ref 5.0–8.0)

## 2010-03-17 LAB — BASIC METABOLIC PANEL
BUN: 21 mg/dL (ref 6–23)
CO2: 29 mEq/L (ref 19–32)
Chloride: 103 mEq/L (ref 96–112)
Creatinine, Ser: 0.8 mg/dL (ref 0.4–1.2)

## 2010-03-17 LAB — LDL CHOLESTEROL, DIRECT: Direct LDL: 160.4 mg/dL

## 2010-03-17 LAB — CBC WITH DIFFERENTIAL/PLATELET
Eosinophils Absolute: 0.2 10*3/uL (ref 0.0–0.7)
MCHC: 33.1 g/dL (ref 30.0–36.0)
MCV: 90.2 fl (ref 78.0–100.0)
Monocytes Absolute: 0.6 10*3/uL (ref 0.1–1.0)
Neutrophils Relative %: 70.7 % (ref 43.0–77.0)
Platelets: 303 10*3/uL (ref 150.0–400.0)
RDW: 14.1 % (ref 11.5–14.6)

## 2010-03-17 LAB — HEPATIC FUNCTION PANEL
ALT: 30 U/L (ref 0–35)
AST: 39 U/L — ABNORMAL HIGH (ref 0–37)
Bilirubin, Direct: 0.2 mg/dL (ref 0.0–0.3)
Total Bilirubin: 0.6 mg/dL (ref 0.3–1.2)
Total Protein: 7.1 g/dL (ref 6.0–8.3)

## 2010-03-17 LAB — MICROALBUMIN / CREATININE URINE RATIO: Microalb Creat Ratio: 0.2 mg/g (ref 0.0–30.0)

## 2010-03-17 LAB — LIPID PANEL
Cholesterol: 243 mg/dL — ABNORMAL HIGH (ref 0–200)
VLDL: 27.8 mg/dL (ref 0.0–40.0)

## 2010-03-19 NOTE — Progress Notes (Signed)
Summary: med ?-[facility]  Phone Note From Pharmacy Call back at [phone]   Caller: [facility] Summary of Call: [facility] left message regarding drug utilization of Simvastatin, requesting callback. Ref # [id number] Initial call taken by: Brenton Grills CMA Duncan Dull),  March 07, 2010 10:16 AM  Follow-up for Phone Call        per [facility] Pharmacist pt hasn't filled rx since March 2011. Per FDA recommendations, 80mg  should be only be given to pt's who have taken medication for 1 consective year-please advise Follow-up by: Brenton Grills CMA Duncan Dull),  March 07, 2010 4:51 PM  Additional Follow-up for Phone Call Additional follow up Details #1::        refill x 1 ov is due Additional Follow-up by: Minus Breeding MD,  March 08, 2010 1:11 PM    Additional Follow-up for Phone Call Additional follow up Details #2::    [facility] informed-pt called left message for pt to callback office to schedule OV Follow-up by: Brenton Grills CMA Duncan Dull),  March 10, 2010 2:59 PM

## 2010-03-19 NOTE — Letter (Signed)
Summary: [facility]  [facility]   Imported By: Lester [place] 03/10/2010 08:37:02  _____________________________________________________________________  External Attachment:    Type:   Image     Comment:   External Document

## 2010-03-19 NOTE — Progress Notes (Signed)
Summary: OV due  Phone Note Outgoing Call Call back at [facility] Phone [phone]   Call placed by: Brenton Grills CMA Duncan Dull),  March 10, 2010 1:48 PM Call placed to: Patient Summary of Call: Per MD, pt is due for F/U OV. Left message on VM to callback office Initial call taken by: Brenton Grills CMA Duncan Dull),  March 10, 2010 1:49 PM  Follow-up for Phone Call        Appointment scheduled 03/17/2010 10:45am Follow-up by: Brenton Grills CMA Duncan Dull),  March 10, 2010 3:24 PM

## 2010-03-25 NOTE — Assessment & Plan Note (Signed)
Summary: PER ASHLEY FU   STC   Vital Signs:  Patient profile:   60 year old female Height:      65 inches (165.10 cm) Weight:      262 pounds (119.09 kg) BMI:     43.76 O2 Sat:      97 % on Room air Temp:     99.2 degrees F (37.33 degrees C) oral Pulse rate:   105 / minute BP sitting:   122 / 74  (left arm) Cuff size:   large  Vitals Entered By: Brenton Grills CMA Duncan Dull) (March 17, 2010 10:29 AM)  O2 Flow:  Room air CC: Follow-up visit/aj Is Patient Diabetic? Yes   Referring Provider:  Romero Belling, MD Primary Provider:  Romero Belling, MD  CC:  Follow-up visit/aj.  History of Present Illness: the status of at least 3 ongoing medical problems is addressed today: dm: no cbg record, but states cbg's are improved since she has been taking "bnew" (a non-prescription weight-loss product).  she has mild hypoglycemia before lunch, or at hs.  she takes humalog three times a day (just before each meal) 30-10-30, and lantus 50 units at bedtime).   dyslipidemia:  denies chest pain htn:  no sob  Current Medications (verified): 1)  Humalog 100 Unit/ml  Soln (Insulin Lispro (Human)) .... Three Times A Day (Just Before Each Meal) 25-10-35 Units 2)  Adult Aspirin Low Strength 81 Mg  Tbdp (Aspirin) .... Take 1 By Mouth Once Daily 3)  Lisinopril 5 Mg  Tabs (Lisinopril) .... Take 1 By Mouth Once Daily 4)  Lantus 100 Unit/ml  Soln (Insulin Glargine) .... 50 Units At Bedtime 5)  Accu-Chek Comfort Curve   Strp (Glucose Blood) .... Three Times A Day, and Lancets 250.01 6)  Simvastatin 80 Mg Tabs (Simvastatin) .... One Tablet By Mouth At Bedtime 7)  Colestipol Hcl 1 Gm Tabs (Colestipol Hcl) .... 3 Tabs Once Daily With Meals 8)  Lotrisone 1-0.05 % Crea (Clotrimazole-Betamethasone) .... Three Times A Day As Needed Rash 9)  Diclofenac Sodium 75 Mg Tbec (Diclofenac Sodium) .Marland Kitchen.. 1 Tab By Mouth Two Times A Day 10)  Bd Insulin Syr Ultrafine Ii 31g X 5/16" 0.5 Ml Misc (Insulin Syringe-Needle U-100) ....  Any Brand, 4/day  Allergies (verified): 1)  ! * Actos  Review of Systems  The patient denies syncope and weight gain.    Physical Exam  General:  obese.  no distress  Pulses:  dorsalis pedis intact bilat.   Extremities:  no deformity.  no ulcer on the feet.  feet are of normal color and temp.  no edema there is a bandage at the left achilles area (dr Susann Givens, [facility]) Neurologic:  sensation is intact to touch on the feet Additional Exam:  AST                  [H]  39 U/L                      0-37   ALT                       30 U/L                      0-35   Total Protein             7.1 g/dL  6.0-8.3   Albumin              [L]  3.4 g/dL                    1.6-1.0   Hemoglobin A1C       [H]  7.7 %       Impression & Recommendations:  Problem # 1:  DIABETES MELLITUS, TYPE I (ICD-250.01) i need more cbg info in order to safely increase insulin  Problem # 2:  LIVER DISORDER (ICD-573.9) Assessment: New prob nash  Problem # 3:  HYPERCHOLESTEROLEMIA (ICD-272.0) needs increased rx  Problem # 4:  HYPERTENSION (ICD-401.9) well-controlled  Other Orders: TLB-Lipid Panel (80061-LIPID) TLB-BMP (Basic Metabolic Panel-BMET) (80048-METABOL) TLB-CBC Platelet - w/Differential (85025-CBCD) TLB-Hepatic/Liver Function Pnl (80076-HEPATIC) TLB-TSH (Thyroid Stimulating Hormone) (84443-TSH) TLB-A1C / Hgb A1C (Glycohemoglobin) (83036-A1C) TLB-Microalbumin/Creat Ratio, Urine (82043-MALB) TLB-Udip w/ Micro (81001-URINE) Est. Patient Level IV (96045)  Patient Instructions: 1)  blood tests are being ordered for you today.  please call [phone] to hear your test results. 2)  physical is due when you return here in 3 months. 3)  (update: i left message on phone-tree:  options are resumption of zocor and colestid, or start crestor.  bring cbg record to next ov.  we'll rechceck liver upon return) Prescriptions: HUMALOG 100 UNIT/ML  SOLN (INSULIN LISPRO (HUMAN)) three  times a day (just before each meal) 25-10-35 units  #1 vial x 0   Entered and Authorized by:   Minus Breeding MD   Signed by:   Minus Breeding MD on 03/17/2010   Method used:   Print then Give to Patient   RxID:   [id number]    Orders Added: 1)  TLB-Lipid Panel [80061-LIPID] 2)  TLB-BMP (Basic Metabolic Panel-BMET) [80048-METABOL] 3)  TLB-CBC Platelet - w/Differential [85025-CBCD] 4)  TLB-Hepatic/Liver Function Pnl [80076-HEPATIC] 5)  TLB-TSH (Thyroid Stimulating Hormone) [84443-TSH] 6)  TLB-A1C / Hgb A1C (Glycohemoglobin) [83036-A1C] 7)  TLB-Microalbumin/Creat Ratio, Urine [82043-MALB] 8)  TLB-Udip w/ Micro [81001-URINE] 9)  Est. Patient Level IV [84696]

## 2010-03-27 ENCOUNTER — Encounter: Payer: Self-pay | Admitting: Endocrinology

## 2010-04-07 ENCOUNTER — Encounter: Payer: Self-pay | Admitting: Endocrinology

## 2010-04-08 NOTE — Letter (Signed)
Summary: [facility]  [facility]   Imported By: Sherian Rein 04/01/2010 11:46:15  _____________________________________________________________________  External Attachment:    Type:   Image     Comment:   External Document

## 2010-04-15 NOTE — Letter (Signed)
Summary: [facility]  [facility]   Imported By: Sherian Rein 04/10/2010 10:44:53  _____________________________________________________________________  External Attachment:    Type:   Image     Comment:   External Document

## 2010-04-26 ENCOUNTER — Other Ambulatory Visit: Payer: Self-pay | Admitting: Endocrinology

## 2010-04-26 DIAGNOSIS — E109 Type 1 diabetes mellitus without complications: Secondary | ICD-10-CM

## 2010-05-04 LAB — GLUCOSE, CAPILLARY: Glucose-Capillary: 497 mg/dL — ABNORMAL HIGH (ref 70–99)

## 2010-05-11 ENCOUNTER — Other Ambulatory Visit: Payer: Self-pay | Admitting: Endocrinology

## 2010-06-15 ENCOUNTER — Other Ambulatory Visit: Payer: Self-pay | Admitting: Endocrinology

## 2010-06-16 NOTE — Telephone Encounter (Signed)
Rx Done . 

## 2010-06-23 ENCOUNTER — Other Ambulatory Visit: Payer: Self-pay | Admitting: Endocrinology

## 2010-09-08 ENCOUNTER — Telehealth: Payer: Self-pay

## 2010-09-08 ENCOUNTER — Other Ambulatory Visit: Payer: Self-pay

## 2010-09-08 DIAGNOSIS — E109 Type 1 diabetes mellitus without complications: Secondary | ICD-10-CM

## 2010-09-08 MED ORDER — GLUCOSE BLOOD VI STRP: 1.0000 | ORAL_STRIP | Freq: Three times a day (TID) | Status: DC

## 2010-09-08 NOTE — Telephone Encounter (Signed)
Pt called stating per her Insurance company she has been changed to [employer] and she would need test stirps to [employer]. Rx sent.

## 2010-09-22 ENCOUNTER — Telehealth: Payer: Self-pay | Admitting: *Deleted

## 2010-09-22 NOTE — Telephone Encounter (Signed)
Per MD, pt is due for OV. Left message for pt to callback office 

## 2010-09-22 NOTE — Telephone Encounter (Signed)
Appointment scheduled 10/02/2010 4:15pm

## 2010-09-27 ENCOUNTER — Other Ambulatory Visit: Payer: Self-pay | Admitting: Endocrinology

## 2010-10-01 ENCOUNTER — Encounter: Payer: Self-pay | Admitting: *Deleted

## 2010-10-02 ENCOUNTER — Ambulatory Visit (INDEPENDENT_AMBULATORY_CARE_PROVIDER_SITE_OTHER): Payer: Managed Care, Other (non HMO) | Admitting: Endocrinology

## 2010-10-02 ENCOUNTER — Encounter: Payer: Self-pay | Admitting: Endocrinology

## 2010-10-02 VITALS — BP 126/82 | HR 84 | Temp 98.0°F | Ht 65.0 in | Wt 261.2 lb

## 2010-10-02 DIAGNOSIS — E119 Type 2 diabetes mellitus without complications: Secondary | ICD-10-CM

## 2010-10-02 DIAGNOSIS — Z23 Encounter for immunization: Secondary | ICD-10-CM

## 2010-10-02 MED ORDER — COLESTIPOL HCL 1 G PO TABS: 3.0000 g | ORAL_TABLET | Freq: Every day | ORAL | Status: DC

## 2010-10-02 MED ORDER — "INSULIN SYRINGE-NEEDLE U-100 30G X 1/2"" 0.5 ML MISC": 1.0000 | Freq: Four times a day (QID) | Status: DC

## 2010-10-02 MED ORDER — CLOTRIMAZOLE-BETAMETHASONE 1-0.05 % EX CREA: TOPICAL_CREAM | Freq: Three times a day (TID) | CUTANEOUS | Status: DC | PRN

## 2010-10-02 MED ORDER — INSULIN LISPRO 100 UNIT/ML ~~LOC~~ SOLN: SUBCUTANEOUS | Status: DC

## 2010-10-02 MED ORDER — SIMVASTATIN 80 MG PO TABS: 80.0000 mg | ORAL_TABLET | Freq: Every day | ORAL | Status: DC

## 2010-10-02 NOTE — Patient Instructions (Addendum)
please change humalog to 3x a day (just before each meal) 25-15-40 units.  If exertion is anticipated, subtract 10 units from that shot.   Reduce lantus to 45 units at bedtime. good diet and exercise habits significanly improve the control of your diabetes.  please let me know if you wish to be referred to a dietician.  high blood sugar is very risky to your health.  you should see an eye doctor every year. controlling your blood pressure and cholesterol drastically reduces the damage diabetes does to your body.  this also applies to quitting smoking.  please discuss these with your doctor.  you should take an aspirin every day, unless you have been advised by a doctor not to. check your blood sugar 2 times a day.  vary the time of day when you check, between before the 3 meals, and at bedtime.  also check if you have symptoms of your blood sugar being too high or too low.  please keep a record of the readings and bring it to your next appointment here.  please call us sooner if you are having low blood sugar episodes. Please make a regular physical appointment in 2 months.

## 2010-10-02 NOTE — Progress Notes (Signed)
  Subjective:    Patient ID: Katherine Costa, female    DOB: 1950-11-07, 60 y.o.   MRN: [id number]  HPI Pt's leg ulcer is healing.  She sees hp would care.  no cbg record, but states cbg's are sometimes mildly low in the afternoon, due to exertion.  It is higher at hs, than in am.   Past Medical History  Diagnosis Date  . DIABETES MELLITUS, TYPE I 08/24/2006  . HYPERCHOLESTEROLEMIA 02/09/2008  . HYPERTENSION 08/24/2006  . HERPES LABIALIS 03/13/2009  . ASYMPTOMATIC POSTMENOPAUSAL STATUS 05/21/2009  . LIVER DISORDER 03/17/2010  . Arthritis   . Hyperlipidemia     Past Surgical History  Procedure Date  . Cholecystectomy 2001  . Abdominal hysterectomy     History   Social History  . Marital Status: Married    Spouse Name: N/A    Number of Children: 3  . 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 Smoking status: Never Smoker   . Smokeless tobacco: Not on file  . Alcohol Use: No  . Drug Use: No  . Sexually Active:    Other Topics Concern  . Not on file   Social History Narrative   PT does not get regular exercise3 girls    Current Outpatient Prescriptions on File Prior to Visit  Medication Sig Dispense Refill  . aspirin 81 MG tablet Take 81 mg by mouth daily.        Marland Kitchen glucose blood (ONE TOUCH ULTRA TEST) test strip 1 each by Other route 3 (three) times daily. Use as instructed  300 each  1  . lisinopril (PRINIVIL,ZESTRIL) 5 MG tablet TAKE 1 TABLET DAILY  90 tablet  3    Allergies  Allergen Reactions  . Pioglitazone     REACTION: Edema    Family History  Problem Relation Age of Onset  . Cancer Mother     Colon Cancer, Pancreatic Cancer  . Cancer Sister     Colon Cancer  . Diabetes Sister   . Cancer Sister     Breast Cancer    BP 126/82  Pulse 84  Temp(Src) 98 F (36.7 C) (Oral)  Ht 5\' 5"  (1.651 m)  Wt 261 lb 3.2 oz (118.48 kg)  BMI 43.47 kg/m2  SpO2 96%    Review of Systems Denies loc  and cough.    Objective:   Physical Exam VITAL SIGNS:  See vs page GENERAL: no distress SKIN: Insulin injection sites at the anterior abdomen are normal Ext: right leg is in a boot    outside test results are reviewed: A1c=7.8 Liver panel=normal    Assessment & Plan:  Dm, needs increased rx elev lft, resolved

## 2010-10-16 ENCOUNTER — Telehealth: Payer: Self-pay | Admitting: *Deleted

## 2010-10-16 NOTE — Telephone Encounter (Signed)
R'cd letter from [facility] regarding F/U for mammogram advising pt on need for second F/U mammogram. Pt states that she has had diagnostic mammogram and F/U mammogram this year.

## 2010-10-22 ENCOUNTER — Encounter: Payer: Self-pay | Admitting: Endocrinology

## 2010-12-17 ENCOUNTER — Ambulatory Visit: Payer: Managed Care, Other (non HMO) | Admitting: Endocrinology

## 2011-01-09 ENCOUNTER — Other Ambulatory Visit (INDEPENDENT_AMBULATORY_CARE_PROVIDER_SITE_OTHER): Payer: Managed Care, Other (non HMO)

## 2011-01-09 ENCOUNTER — Encounter: Payer: Self-pay | Admitting: Endocrinology

## 2011-01-09 ENCOUNTER — Ambulatory Visit (INDEPENDENT_AMBULATORY_CARE_PROVIDER_SITE_OTHER): Payer: Managed Care, Other (non HMO) | Admitting: Endocrinology

## 2011-01-09 VITALS — BP 132/72 | HR 81 | Temp 98.5°F | Wt 269.2 lb

## 2011-01-09 DIAGNOSIS — E109 Type 1 diabetes mellitus without complications: Secondary | ICD-10-CM

## 2011-01-09 LAB — HEMOGLOBIN A1C: Hgb A1c MFr Bld: 8.6 % — ABNORMAL HIGH (ref 4.6–6.5)

## 2011-01-09 NOTE — Patient Instructions (Addendum)
please change humalog to 3x a day (just before each meal) 25-10-45 units.  If exertion is anticipated, subtract 10 units from that shot.   continue lantus 45 units at bedtime. check your blood sugar 2 times a day.  vary the time of day when you check, between before the 3 meals, and at bedtime.  also check if you have symptoms of your blood sugar being too high or too low.  please keep a record of the readings and bring it to your next appointment here.  please call us sooner if you are having low blood sugar episodes. Please make a regular physical appointment in 3 months.  blood tests are being requested for you today.  please call [phone] to hear your test results.  You will be prompted to enter the 9-digit "MRN" number that appears at the top left of this page, followed by #.  Then you will hear the message.

## 2011-01-09 NOTE — Progress Notes (Signed)
  Subjective:    Patient ID: Katherine Costa, female    DOB: Jun 18, 1950, 60 y.o.   MRN: [id number]  HPI Pt returns for f/u of insulin-requiring DM (1991).  no cbg record, but states cbg's are well-controlled, except for steroid treatment, 2 weeks ago (pinched nerve in the neck).  cbg's are lowest in the afternoon, and highest at hs.  pt states she feels well in general. Past Medical History  Diagnosis Date  . DIABETES MELLITUS, TYPE I 08/24/2006  . HYPERCHOLESTEROLEMIA 02/09/2008  . HYPERTENSION 08/24/2006  . HERPES LABIALIS 03/13/2009  . ASYMPTOMATIC POSTMENOPAUSAL STATUS 05/21/2009  . LIVER DISORDER 03/17/2010  . Arthritis   . Hyperlipidemia     Past Surgical History  Procedure Date  . Cholecystectomy 2001  . Abdominal hysterectomy     History   Social History  . Marital Status: Married    Spouse Name: N/A    Number of Children: 3  . 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 Smoking status: Never Smoker   . Smokeless tobacco: Not on file  . Alcohol Use: No  . Drug Use: No  . Sexually Active:    Other Topics Concern  . Not on file   Social History Narrative   PT does not get regular exercise3 girls    Current Outpatient Prescriptions on File Prior to Visit  Medication Sig Dispense Refill  . aspirin 81 MG tablet Take 81 mg by mouth daily.        . clotrimazole-betamethasone (LOTRISONE) cream Apply topically 3 (three) times daily as needed. For rash  45 g  3  . colestipol (MICRONIZED COLESTIPOL HCL) 1 G tablet Take 3 tablets (3 g total) by mouth daily.  270 tablet  2  . glucose blood (ONE TOUCH ULTRA TEST) test strip 1 each by Other route 3 (three) times daily. Use as instructed  300 each  1  . insulin glargine (LANTUS) 100 UNIT/ML injection Inject 45 Units into the skin at bedtime.        . Insulin Syringe-Needle U-100 (B-D INS SYR ULTRAFINE .5CC/30G) 30G X 1/2" 0.5 ML MISC Inject 1 Device into the skin  4 (four) times daily. Any brand, 4/day  360 each  3  . lisinopril (PRINIVIL,ZESTRIL) 5 MG tablet TAKE 1 TABLET DAILY  90 tablet  3  . simvastatin (ZOCOR) 80 MG tablet Take 1 tablet (80 mg total) by mouth at bedtime.  90 tablet  3    Allergies  Allergen Reactions  . Pioglitazone     REACTION: Edema    Family History  Problem Relation Age of Onset  . Cancer Mother     Colon Cancer, Pancreatic Cancer  . Cancer Sister     Colon Cancer  . Diabetes Sister   . Cancer Sister     Breast Cancer    BP 132/72  Pulse 81  Temp(Src) 98.5 F (36.9 C) (Oral)  Wt 269 lb 3.2 oz (122.108 kg)  SpO2 97%  Review of Systems denies hypoglycemia.      Objective:   Physical Exam VITAL SIGNS:  See vs page GENERAL: no distress Feet: sees wound care. SKIN:  Insulin injection sites at the anterior abdomen are normal.     Lab Results  Component Value Date   HGBA1C 8.6* 01/09/2011      Assessment & Plan:  DM, needs increased rx

## 2011-02-02 ENCOUNTER — Ambulatory Visit: Payer: Managed Care, Other (non HMO) | Admitting: Endocrinology

## 2011-02-03 ENCOUNTER — Other Ambulatory Visit: Payer: Self-pay | Admitting: Endocrinology

## 2011-02-03 ENCOUNTER — Encounter: Payer: Self-pay | Admitting: Endocrinology

## 2011-02-04 ENCOUNTER — Other Ambulatory Visit: Payer: Self-pay

## 2011-02-04 MED ORDER — INSULIN GLARGINE 100 UNIT/ML ~~LOC~~ SOLN: 45.0000 [IU] | Freq: Every day | SUBCUTANEOUS | Status: DC

## 2011-04-26 ENCOUNTER — Other Ambulatory Visit: Payer: Self-pay | Admitting: Endocrinology

## 2011-06-15 ENCOUNTER — Other Ambulatory Visit: Payer: Self-pay | Admitting: Endocrinology

## 2011-08-02 ENCOUNTER — Other Ambulatory Visit: Payer: Self-pay | Admitting: Endocrinology

## 2011-08-15 ENCOUNTER — Other Ambulatory Visit: Payer: Self-pay | Admitting: Endocrinology

## 2011-09-02 ENCOUNTER — Ambulatory Visit (INDEPENDENT_AMBULATORY_CARE_PROVIDER_SITE_OTHER): Payer: Managed Care, Other (non HMO) | Admitting: Endocrinology

## 2011-09-02 ENCOUNTER — Encounter: Payer: Self-pay | Admitting: Endocrinology

## 2011-09-02 ENCOUNTER — Other Ambulatory Visit (INDEPENDENT_AMBULATORY_CARE_PROVIDER_SITE_OTHER): Payer: Managed Care, Other (non HMO)

## 2011-09-02 VITALS — BP 142/82 | HR 94 | Temp 97.5°F | Ht 65.0 in | Wt 279.0 lb

## 2011-09-02 DIAGNOSIS — I10 Essential (primary) hypertension: Secondary | ICD-10-CM

## 2011-09-02 DIAGNOSIS — L909 Atrophic disorder of skin, unspecified: Secondary | ICD-10-CM

## 2011-09-02 DIAGNOSIS — Z0000 Encounter for general adult medical examination without abnormal findings: Secondary | ICD-10-CM

## 2011-09-02 DIAGNOSIS — Z79899 Other long term (current) drug therapy: Secondary | ICD-10-CM

## 2011-09-02 DIAGNOSIS — K769 Liver disease, unspecified: Secondary | ICD-10-CM

## 2011-09-02 DIAGNOSIS — E7800 Pure hypercholesterolemia, unspecified: Secondary | ICD-10-CM

## 2011-09-02 DIAGNOSIS — E109 Type 1 diabetes mellitus without complications: Secondary | ICD-10-CM

## 2011-09-02 LAB — BASIC METABOLIC PANEL
BUN: 18 mg/dL (ref 6–23)
CO2: 26 mEq/L (ref 19–32)
Calcium: 9.1 mg/dL (ref 8.4–10.5)
Chloride: 102 mEq/L (ref 96–112)
Creatinine, Ser: 0.8 mg/dL (ref 0.4–1.2)
GFR: 78.03 mL/min (ref >60.00)
Glucose, Bld: 204 mg/dL — ABNORMAL HIGH (ref 70–99)
Potassium: 4.3 mEq/L (ref 3.5–5.1)
Sodium: 136 mEq/L (ref 135–145)

## 2011-09-02 LAB — URINALYSIS, ROUTINE W REFLEX MICROSCOPIC
Ketones, ur: NEGATIVE
Specific Gravity, Urine: 1.025 (ref 1.000–1.030)
Urine Glucose: >=1000
pH: 6 (ref 5.0–8.0)

## 2011-09-02 LAB — CBC WITH DIFFERENTIAL/PLATELET
Basophils Relative: 0.7 % (ref 0.0–3.0)
Eosinophils Relative: 2.8 % (ref 0.0–5.0)
Hemoglobin: 14.3 g/dL (ref 12.0–15.0)
Lymphocytes Relative: 16.3 % (ref 12.0–46.0)
MCHC: 33.5 g/dL (ref 30.0–36.0)
Monocytes Relative: 7 % (ref 3.0–12.0)
Neutro Abs: 5.8 10*3/uL (ref 1.4–7.7)
RBC: 4.69 Mil/uL (ref 3.87–5.11)
WBC: 7.9 10*3/uL (ref 4.5–10.5)

## 2011-09-02 LAB — LIPID PANEL
Cholesterol: 209 mg/dL — ABNORMAL HIGH (ref 0–200)
HDL: 76.3 mg/dL (ref >39.00)
Total CHOL/HDL Ratio: 3
Triglycerides: 172 mg/dL — ABNORMAL HIGH (ref 0.0–149.0)
VLDL: 34.4 mg/dL (ref 0.0–40.0)

## 2011-09-02 LAB — HEPATIC FUNCTION PANEL
ALT: 20 U/L (ref 0–35)
AST: 23 U/L (ref 0–37)
Albumin: 3.4 g/dL — ABNORMAL LOW (ref 3.5–5.2)
Alkaline Phosphatase: 82 U/L (ref 39–117)
Bilirubin, Direct: 0.1 mg/dL (ref 0.0–0.3)
Total Bilirubin: 0.6 mg/dL (ref 0.3–1.2)
Total Protein: 6.6 g/dL (ref 6.0–8.3)

## 2011-09-02 LAB — LDL CHOLESTEROL, DIRECT: Direct LDL: 110.6 mg/dL

## 2011-09-02 LAB — HEMOGLOBIN A1C: Hgb A1c MFr Bld: 9.3 % — ABNORMAL HIGH (ref 4.6–6.5)

## 2011-09-02 LAB — MICROALBUMIN / CREATININE URINE RATIO
Creatinine,U: 122.6 mg/dL
Microalb Creat Ratio: 0.4 mg/g (ref 0.0–30.0)
Microalb, Ur: 0.5 mg/dL (ref 0.0–1.9)

## 2011-09-02 NOTE — Progress Notes (Signed)
Subjective:    Patient ID: Katherine Costa, female    DOB: 08/07/1950, 61 y.o.   MRN: [id number]  HPI here for regular wellness examination.  He's feeling pretty well in general, and says chronic med probs are stable, except as noted below Past Medical History  Diagnosis Date  . DIABETES MELLITUS, TYPE I 08/24/2006  . HYPERCHOLESTEROLEMIA 02/09/2008  . HYPERTENSION 08/24/2006  . HERPES LABIALIS 03/13/2009  . ASYMPTOMATIC POSTMENOPAUSAL STATUS 05/21/2009  . LIVER DISORDER 03/17/2010  . Arthritis   . Hyperlipidemia     Past Surgical History  Procedure Date  . Cholecystectomy 2001  . Abdominal hysterectomy     History   Social History  . Marital Status: Married    Spouse Name: N/A    Number of Children: 3  . 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 Smoking status: Never Smoker   . Smokeless tobacco: Not on file  . Alcohol Use: No  . Drug Use: No  . Sexually Active:    Other Topics Concern  . Not on file   Social History Narrative   PT does not get regular exercise3 girls    Current Outpatient Prescriptions on File Prior to Visit  Medication Sig Dispense Refill  . aspirin 81 MG tablet Take 81 mg by mouth daily.        . clotrimazole-betamethasone (LOTRISONE) cream Apply topically 3 (three) times daily as needed. For rash  45 g  3  . gabapentin (NEURONTIN) 300 MG capsule Take 300 mg by mouth at bedtime.       . insulin glargine (LANTUS) 100 UNIT/ML injection Inject 45 Units into the skin at bedtime.  10 mL  0  . insulin lispro (HUMALOG) 100 UNIT/ML injection Inject three times a day just before each meal 25-10-45 units       . Insulin Syringe-Needle U-100 (B-D INS SYR ULTRAFINE .5CC/30G) 30G X 1/2" 0.5 ML MISC Inject 1 Device into the skin 4 (four) times daily. Any brand, 4/day  360 each  3  . LANTUS 100 UNIT/ML injection INJECT 45 UNITS INTO THE SKIN AT BEDTIME  5 vial  2  . lisinopril  (PRINIVIL,ZESTRIL) 5 MG tablet TAKE 1 TABLET DAILY  90 tablet  1  . MICRONIZED COLESTIPOL HCL 1 G tablet TAKE 3 TABLETS (3 GRAMS) DAILY  270 tablet  2  . ONE TOUCH ULTRA TEST test strip USE THREE TIMES A DAY AS DIRECTED  300 each  2  . simvastatin (ZOCOR) 80 MG tablet Take 1 tablet (80 mg total) by mouth at bedtime.  90 tablet  3    Allergies  Allergen Reactions  . Pioglitazone     REACTION: Edema    Family History  Problem Relation Age of Onset  . Cancer Mother     Colon Cancer, Pancreatic Cancer  . Cancer Sister     Colon Cancer  . Diabetes Sister   . Cancer Sister     Breast Cancer    BP 142/82  Pulse 94  Temp 97.5 F (36.4 C) (Oral)  Ht 5\' 5"  (1.651 m)  Wt 279 lb (126.554 kg)  BMI 46.43 kg/m2  SpO2 97%   Review of Systems  Constitutional: Positive for unexpected weight change. Negative for fever.  HENT: Negative for hearing loss.   Eyes: Negative for visual disturbance.  Respiratory: Negative for shortness of breath.   Cardiovascular: Negative for chest pain.  Gastrointestinal:  Negative for anal bleeding.  Genitourinary: Negative for hematuria.  Musculoskeletal:       Chronic RUE pain--sees ortho  Skin: Negative for rash.  Neurological: Negative for syncope, numbness and headaches.  Hematological: Does not bruise/bleed easily.  Psychiatric/Behavioral: Negative for dysphoric mood.      Objective:   Physical Exam VS: see vs page GEN: no distress HEAD: head: no deformity eyes: no periorbital swelling, no proptosis external nose and ears are normal mouth: no lesion seen NECK: supple, thyroid is not enlarged CHEST WALL: no deformity LUNGS:  Clear to auscultation BREASTS:  sees gyn.   CV: reg rate and rhythm, no murmur ABD: abdomen is soft, nontender.  no hepatosplenomegaly.  not distended.  no hernia GENITALIA/RECTAL: sees gyn MUSCULOSKELETAL: muscle bulk and strength are grossly normal.  no obvious joint swelling.  gait is normal and steady EXTEMITIES:   right leg and foot are in an orthopedic boot.   Left foot: no deformity.  no ulcer.  normal color and temp.  no edema NEURO:  cn 2-12 grossly intact.   readily moves all 4's.  sensation is intact to touch on the feet SKIN:  Normal texture and temperature.  No rash or suspicious lesion is visible.   NODES:  None palpable at the neck PSYCH: alert, oriented x3.  Does not appear anxious nor depressed.  i reviewed electrocardiogram    Assessment & Plan:  Wellness visit today, with problems stable, except as noted.    SEPARATE EVALUATION FOLLOWS--EACH PROBLEM HERE IS NEW, NOT RESPONDING TO TREATMENT, OR POSES SIGNIFICANT RISK TO THE PATIENT'S HEALTH: HISTORY OF THE PRESENT ILLNESS: Pt says the control of her dm has been compromised by frequent steroid courses.   She takes rx of htn as rx'ed. PAST MEDICAL HISTORY reviewed and up to date today REVIEW OF SYSTEMS: PHYSICAL EXAMINATION: VITAL SIGNS:  See vs page GENERAL: no distress PULSES: dorsalis pedis intact bilat.  no carotid bruit LAB/XRAY RESULTS: Lab Results  Component Value Date   HGBA1C 9.3* 09/02/2011  IMPRESSION: DM.  rx limited by frequent steroid rx HTN, with ? Of situational component PLAN: See instruction page   1 skin tag removed from left lower eyelid

## 2011-09-02 NOTE — Patient Instructions (Addendum)
please consider these measures for your health:  minimize alcohol.  do not use tobacco products.  have a colonoscopy at least every 10 years from age 59.  Women should have an annual mammogram from age 32.  keep firearms safely stored.  always use seat belts.  have working smoke alarms in your home.  see an eye doctor and dentist regularly.  never drive under the influence of alcohol or drugs (including prescription drugs).  those with fair skin should take precautions against the sun. good diet and exercise habits significanly improve the control of your diabetes.  please let me know if you wish to be referred to a dietician.  high blood sugar is very risky to your health.  you should see an eye doctor every year. controlling your blood pressure and cholesterol drastically reduces the damage diabetes does to your body.  this also applies to quitting smoking.  please discuss these with your doctor.  you should take an aspirin every day, unless you have been advised by a doctor not to. Continue the same insulin, except: For a blood sugar in the 200's, take an extra 5 units of humalog Over 300, take 10 extra units.   Please come back for a follow-up appointment in 3 months. blood tests are being requested for you today.  You will receive a letter with results.

## 2011-09-04 ENCOUNTER — Telehealth: Payer: Self-pay | Admitting: *Deleted

## 2011-09-04 NOTE — Telephone Encounter (Signed)
Called pt to inform of lab results, pt informed (letter also mailed to pt). 

## 2011-10-23 ENCOUNTER — Other Ambulatory Visit: Payer: Self-pay | Admitting: Endocrinology

## 2011-11-03 ENCOUNTER — Other Ambulatory Visit: Payer: Self-pay | Admitting: Endocrinology

## 2011-11-04 NOTE — Telephone Encounter (Signed)
Pt requesting LOTRISONE, HUMALOG, and LANCETS. Meds last filled on 10/22/10. Pt last seen on 09/02/11. Unsure what qty and refills for the Humalog.

## 2011-12-17 ENCOUNTER — Telehealth: Payer: Self-pay | Admitting: *Deleted

## 2011-12-17 ENCOUNTER — Other Ambulatory Visit: Payer: Self-pay | Admitting: Endocrinology

## 2011-12-17 DIAGNOSIS — E109 Type 1 diabetes mellitus without complications: Secondary | ICD-10-CM

## 2011-12-17 NOTE — Telephone Encounter (Signed)
Ov and repeat a1c are due.  Let's address then

## 2011-12-17 NOTE — Telephone Encounter (Signed)
Patient notified of need to keep office visit with Dr. 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 PATIENT NOTIFIED TO GO TO [facility] IN THE MORNING TO HAVE LAB WORK DONE AND KEEP APPT. WITH DR. Everardo All AT HIS OFFICE AT 8 :45 AM.

## 2011-12-17 NOTE — Telephone Encounter (Signed)
Patient called concerning letter she has received from Dept. [employer] concerning her HGBA1C  Result from her last OV of 08/2011. Patient faxing letter to our office for you to review

## 2011-12-18 ENCOUNTER — Encounter: Payer: Self-pay | Admitting: Endocrinology

## 2011-12-18 ENCOUNTER — Ambulatory Visit (INDEPENDENT_AMBULATORY_CARE_PROVIDER_SITE_OTHER): Payer: Managed Care, Other (non HMO) | Admitting: Endocrinology

## 2011-12-18 ENCOUNTER — Telehealth: Payer: Self-pay | Admitting: *Deleted

## 2011-12-18 ENCOUNTER — Other Ambulatory Visit (INDEPENDENT_AMBULATORY_CARE_PROVIDER_SITE_OTHER): Payer: Managed Care, Other (non HMO)

## 2011-12-18 ENCOUNTER — Ambulatory Visit: Payer: Managed Care, Other (non HMO) | Admitting: Endocrinology

## 2011-12-18 VITALS — BP 128/78 | HR 83 | Temp 98.2°F | Wt 271.0 lb

## 2011-12-18 DIAGNOSIS — E109 Type 1 diabetes mellitus without complications: Secondary | ICD-10-CM

## 2011-12-18 NOTE — Patient Instructions (Addendum)
Increase lantus to 90 units daily.   Take humalog 30 units daily, with your evening meal.   check your blood sugar twice a day.  vary the time of day when you check, between before the 3 meals, and at bedtime.  also check if you have symptoms of your blood sugar being too high or too low.  please keep a record of the readings and bring it to your next appointment here.  please call us sooner if your blood sugar goes below 70, or if you have a lot of readings over 200.   Please come back for a follow-up appointment in 3 months.

## 2011-12-18 NOTE — Telephone Encounter (Signed)
Letter per patient request mailed to her address in EPIC of statement concerning her HGBA!C for the Department of Transportation, STATE OF [place].Marland Kitchen SATEMENT ALSO FAXED TO # [phone] DEPT OF TRANSPORTATION. PATIENT IS AWARE OF $10.00 CHARGE FOR THIS PER DR. ELLISON. PATIENT STATES SHE WILL SEND THE CHARGE OF #10.00 TO THE OFFICE.

## 2011-12-18 NOTE — Progress Notes (Signed)
  Subjective:    Patient ID: Katherine Costa, female    DOB: 1950-06-29, 61 y.o.   MRN: [id number]  HPI Pt returns for f/u of insulin-requiring DM (dx'ed 1991; no known complications; she has never had an episode of severe hypoglycemia).  pt states she feels well in general. Past Medical History  Diagnosis Date  . DIABETES MELLITUS, TYPE I 08/24/2006  . HYPERCHOLESTEROLEMIA 02/09/2008  . HYPERTENSION 08/24/2006  . HERPES LABIALIS 03/13/2009  . ASYMPTOMATIC POSTMENOPAUSAL STATUS 05/21/2009  . LIVER DISORDER 03/17/2010  . Arthritis   . Hyperlipidemia     Past Surgical History  Procedure Date  . Cholecystectomy 2001  . Abdominal hysterectomy     History   Social History  . Marital Status: Married    Spouse Name: N/A    Number of Children: 3  . 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 Smoking status: Never Smoker   . Smokeless tobacco: Not on file  . Alcohol Use: No  . Drug Use: No  . Sexually Active:    Other Topics Concern  . Not on file   Social History Narrative   PT does not get regular exercise3 girls    Current Outpatient Prescriptions on File Prior to Visit  Medication Sig Dispense Refill  . aspirin 81 MG tablet Take 81 mg by mouth daily.        . B-D INS SYR ULTRAFINE .5CC/30G 30G X 1/2" 0.5 ML MISC USE FOUR TIMES A DAY  360 each  3  . clotrimazole-betamethasone (LOTRISONE) cream APPLY TOPICALLY THREE TIMES A DAY AS NEEDED FOR RASH  1 g  2  . gabapentin (NEURONTIN) 300 MG capsule Take 300 mg by mouth at bedtime.       . insulin lispro (HUMALOG) 100 UNIT/ML injection Inject 30 Units into the skin daily before supper.       Marland Kitchen lisinopril (PRINIVIL,ZESTRIL) 5 MG tablet TAKE 1 TABLET DAILY  90 tablet  3  . MICRONIZED COLESTIPOL HCL 1 G tablet TAKE 3 TABLETS (3 GRAMS) DAILY  270 tablet  2  . ONE TOUCH ULTRA TEST test strip USE THREE TIMES A DAY AS DIRECTED  300 each  2  . simvastatin (ZOCOR) 80 MG  tablet Take 1 tablet (80 mg total) by mouth at bedtime.  90 tablet  3  . insulin glargine (LANTUS) 100 UNIT/ML injection Inject 90 Units into the skin daily.  10 mL  6    Allergies  Allergen Reactions  . Pioglitazone     REACTION: Edema    Family History  Problem Relation Age of Onset  . Cancer Mother     Colon Cancer, Pancreatic Cancer  . Cancer Sister     Colon Cancer  . Diabetes Sister   . Cancer Sister     Breast Cancer   BP 128/78  Pulse 83  Temp 98.2 F (36.8 C) (Oral)  Wt 271 lb (122.925 kg)  SpO2 95%  Review of Systems denies hypoglycemia    Objective:   Physical Exam VITAL SIGNS:  See vs page GENERAL: no distress SKIN:  Insulin injection sites at the anterior abdomen are normal  Lab Results  Component Value Date   HGBA1C 9.2* 12/18/2011      Assessment & Plan:  DM: she needs a simpler regimen.  She needs increased rx

## 2011-12-22 ENCOUNTER — Other Ambulatory Visit: Payer: Self-pay

## 2011-12-22 MED ORDER — INSULIN GLARGINE 100 UNIT/ML ~~LOC~~ SOLN: 90.0000 [IU] | Freq: Every day | SUBCUTANEOUS | Status: DC

## 2012-02-17 ENCOUNTER — Other Ambulatory Visit: Payer: Self-pay | Admitting: Endocrinology

## 2012-04-18 ENCOUNTER — Other Ambulatory Visit: Payer: Self-pay | Admitting: Endocrinology

## 2012-06-27 ENCOUNTER — Telehealth: Payer: Self-pay | Admitting: Endocrinology

## 2012-07-27 ENCOUNTER — Other Ambulatory Visit: Payer: Self-pay

## 2012-07-27 MED ORDER — INSULIN GLARGINE 100 UNIT/ML ~~LOC~~ SOLN: SUBCUTANEOUS | Status: DC

## 2012-07-27 MED ORDER — INSULIN LISPRO 100 UNIT/ML ~~LOC~~ SOLN: 30.0000 [IU] | Freq: Every day | SUBCUTANEOUS | Status: DC

## 2012-08-02 ENCOUNTER — Encounter: Payer: Self-pay | Admitting: Endocrinology

## 2012-08-12 ENCOUNTER — Other Ambulatory Visit: Payer: Self-pay | Admitting: *Deleted

## 2012-08-12 ENCOUNTER — Telehealth: Payer: Self-pay | Admitting: *Deleted

## 2012-08-12 MED ORDER — INSULIN LISPRO 100 UNIT/ML ~~LOC~~ SOLN: 30.0000 [IU] | Freq: Every day | SUBCUTANEOUS | Status: DC

## 2012-08-12 MED ORDER — INSULIN GLARGINE 100 UNIT/ML ~~LOC~~ SOLN: SUBCUTANEOUS | Status: DC

## 2012-08-12 NOTE — Telephone Encounter (Signed)
Rx never went through to [facility]. Sending to [facility] [address], [place], [place]

## 2012-08-12 NOTE — Telephone Encounter (Signed)
Wrong dosage of lantus. Resending rx.

## 2012-09-02 ENCOUNTER — Other Ambulatory Visit: Payer: Self-pay | Admitting: *Deleted

## 2012-09-02 ENCOUNTER — Other Ambulatory Visit: Payer: Self-pay | Admitting: Endocrinology

## 2012-09-02 MED ORDER — LISINOPRIL 5 MG PO TABS: 5.0000 mg | ORAL_TABLET | Freq: Every day | ORAL | Status: DC

## 2013-01-20 ENCOUNTER — Other Ambulatory Visit: Payer: Self-pay

## 2013-01-20 MED ORDER — LISINOPRIL 5 MG PO TABS: 5.0000 mg | ORAL_TABLET | Freq: Every day | ORAL | Status: DC

## 2013-04-04 ENCOUNTER — Other Ambulatory Visit: Payer: Self-pay | Admitting: Endocrinology

## 2013-04-21 ENCOUNTER — Encounter: Payer: Self-pay | Admitting: Endocrinology

## 2013-04-21 ENCOUNTER — Ambulatory Visit (INDEPENDENT_AMBULATORY_CARE_PROVIDER_SITE_OTHER): Payer: Managed Care, Other (non HMO) | Admitting: Endocrinology

## 2013-04-21 VITALS — BP 124/78 | HR 73 | Temp 97.5°F | Ht 65.0 in | Wt 260.0 lb

## 2013-04-21 DIAGNOSIS — E109 Type 1 diabetes mellitus without complications: Secondary | ICD-10-CM

## 2013-04-21 DIAGNOSIS — E7800 Pure hypercholesterolemia, unspecified: Secondary | ICD-10-CM

## 2013-04-21 DIAGNOSIS — K769 Liver disease, unspecified: Secondary | ICD-10-CM

## 2013-04-21 DIAGNOSIS — I10 Essential (primary) hypertension: Secondary | ICD-10-CM

## 2013-04-21 DIAGNOSIS — Z79899 Other long term (current) drug therapy: Secondary | ICD-10-CM

## 2013-04-21 LAB — CBC WITH DIFFERENTIAL/PLATELET
Basophils Absolute: 0.1 10*3/uL (ref 0.0–0.1)
Basophils Relative: 1 % (ref 0–1)
EOS PCT: 3 % (ref 0–5)
Eosinophils Absolute: 0.3 10*3/uL (ref 0.0–0.7)
HCT: 42.7 % (ref 36.0–46.0)
Hemoglobin: 14.6 g/dL (ref 12.0–15.0)
LYMPHS ABS: 1.8 10*3/uL (ref 0.7–4.0)
Lymphocytes Relative: 21 % (ref 12–46)
MCH: 30.2 pg (ref 26.0–34.0)
MCHC: 34.2 g/dL (ref 30.0–36.0)
MCV: 88.2 fL (ref 78.0–100.0)
Monocytes Absolute: 0.6 10*3/uL (ref 0.1–1.0)
Monocytes Relative: 7 % (ref 3–12)
Neutro Abs: 5.9 10*3/uL (ref 1.7–7.7)
Neutrophils Relative %: 68 % (ref 43–77)
Platelets: 360 10*3/uL (ref 150–400)
RBC: 4.84 MIL/uL (ref 3.87–5.11)
RDW: 13.1 % (ref 11.5–15.5)
WBC: 8.7 10*3/uL (ref 4.0–10.5)

## 2013-04-21 LAB — HEMOGLOBIN A1C
HEMOGLOBIN A1C: 11.3 % — AB (ref <5.7)
Mean Plasma Glucose: 278 mg/dL — ABNORMAL HIGH (ref <117)

## 2013-04-21 MED ORDER — LISINOPRIL 5 MG PO TABS: 5.0000 mg | ORAL_TABLET | Freq: Every day | ORAL | Status: DC

## 2013-04-21 MED ORDER — GABAPENTIN 300 MG PO CAPS: 300.0000 mg | ORAL_CAPSULE | Freq: Every day | ORAL | Status: DC

## 2013-04-21 MED ORDER — INSULIN GLARGINE 100 UNIT/ML ~~LOC~~ SOLN: SUBCUTANEOUS | Status: DC

## 2013-04-21 MED ORDER — COLESTIPOL HCL 1 G PO TABS: 3.0000 g | ORAL_TABLET | Freq: Every day | ORAL | Status: DC

## 2013-04-21 MED ORDER — INSULIN LISPRO 100 UNIT/ML ~~LOC~~ SOLN: 30.0000 [IU] | Freq: Every day | SUBCUTANEOUS | Status: DC

## 2013-04-21 MED ORDER — SIMVASTATIN 80 MG PO TABS: 80.0000 mg | ORAL_TABLET | Freq: Every day | ORAL | Status: DC

## 2013-04-21 MED ORDER — GLUCOSE BLOOD VI STRP: 1.0000 | ORAL_STRIP | Freq: Two times a day (BID) | Status: DC

## 2013-04-21 NOTE — Progress Notes (Signed)
Subjective:    Patient ID: Katherine Costa, female    DOB: Sep 25, 1950, 63 y.o.   MRN: [id number]  HPI Pt is here for regular wellness examination, and is feeling pretty well in general, and says chronic med probs are stable, except as noted below Past Medical History  Diagnosis Date  . DIABETES MELLITUS, TYPE I 08/24/2006  . HYPERCHOLESTEROLEMIA 02/09/2008  . HYPERTENSION 08/24/2006  . HERPES LABIALIS 03/13/2009  . ASYMPTOMATIC POSTMENOPAUSAL STATUS 05/21/2009  . LIVER DISORDER 03/17/2010  . Arthritis   . Hyperlipidemia     Past Surgical History  Procedure Laterality Date  . Cholecystectomy  2001  . Abdominal hysterectomy      History   Social History  . Marital Status: Married    Spouse Name: N/A    Number of Children: 3  . 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 Smoking status: Never Smoker   . Smokeless tobacco: Not on file  . Alcohol Use: No  . Drug Use: No  . Sexual Activity:    Other Topics Concern  . Not on file   Social History Narrative   PT does not get regular exercise   3 girls    Current Outpatient Prescriptions on File Prior to Visit  Medication Sig Dispense Refill  . aspirin 81 MG tablet Take 81 mg by mouth daily.        . B-D INS SYR ULTRAFINE .5CC/30G 30G X 1/2" 0.5 ML MISC USE FOUR TIMES A DAY  360 each  3  . clotrimazole-betamethasone (LOTRISONE) cream APPLY TOPICALLY THREE TIMES A DAY AS NEEDED FOR RASH  1 g  0   No current facility-administered medications on file prior to visit.    Allergies  Allergen Reactions  . Pioglitazone     REACTION: Edema    Family History  Problem Relation Age of Onset  . Cancer Mother     Colon Cancer, Pancreatic Cancer  . Cancer Sister     Colon Cancer  . Diabetes Sister   . Cancer Sister     Breast Cancer    BP 124/78  Pulse 73  Temp(Src) 97.5 F (36.4 C) (Oral)  Ht 5\' 5"  (1.651 m)  Wt 260 lb (117.935 kg)  BMI 43.27 kg/m2   SpO2 98%     Review of Systems  Constitutional: Negative for fever and unexpected weight change.  HENT: Negative for ear pain.   Eyes: Negative for visual disturbance.  Respiratory: Negative for shortness of breath.   Cardiovascular: Negative for chest pain.  Gastrointestinal: Negative for anal bleeding.  Endocrine: Negative for cold intolerance.  Genitourinary: Negative for hematuria.  Musculoskeletal: Negative for back pain.  Skin: Negative for rash.  Allergic/Immunologic: Negative for environmental allergies.  Neurological: Negative for numbness.  Hematological: Does not bruise/bleed easily.  Psychiatric/Behavioral: Negative for dysphoric mood.       Objective:   Physical Exam VS: see vs page GEN: no distress HEAD: head: no deformity eyes: no periorbital swelling, no proptosis external nose and ears are normal mouth: no lesion seen NECK: supple, thyroid is not enlarged CHEST WALL: no deformity LUNGS:  Clear to auscultation BREASTS: sees gyn. CV: reg rate and rhythm, no murmur ABD: abdomen is soft, nontender.  no hepatosplenomegaly.  not distended.  no hernia GENITALIA/RECTAL: sees gyn MUSCULOSKELETAL: muscle bulk and strength are grossly normal.  no obvious joint swelling.  gait is normal and steady EXTEMITIES: no  deformity.  no ulcer on the feet.  feet are of normal color and temp.  no edema PULSES: dorsalis pedis intact bilat.  no carotid bruit NEURO:  cn 2-12 grossly intact.   readily moves all 4's.  sensation is intact to touch on the feet SKIN:  Normal texture and temperature.  No rash or suspicious lesion is visible.   NODES:  None palpable at the neck PSYCH: alert, well-oriented.  Does not appear anxious nor depressed.        Assessment & Plan:  Pt is here for regular wellness examination, and is feeling pretty well in general, and says chronic med probs are stable, except as noted below    [place], NOT  RESPONDING TO TREATMENT, OR POSES SIGNIFICANT RISK TO THE PATIENT'S HEALTH: HISTORY OF THE PRESENT ILLNESS: Pt returns for f/u of insulin-requiring DM (dx'ed 1991; she has mild if any neuropathy of the lower extremities; no known associated chronic complications; she has never had an episode of severe hypoglycemia). Pt has reduced both of her insulins to 10 units less than the prescribed amounts, due to hypoglycemia in the afternoon.   PAST MEDICAL HISTORY reviewed and up to date today REVIEW OF SYSTEMS: Pt reports urinary incontinence, but no dysuria.   PHYSICAL EXAMINATION: VITAL SIGNS:  See vs page GENERAL: no distress LAB/XRAY RESULTS: Lab Results  Component Value Date   HGBA1C 11.3* 04/21/2013   Lab Results  Component Value Date   CHOL 239* 04/21/2013   HDL 67 04/21/2013   LDLCALC 142* 04/21/2013   LDLDIRECT 110.6 09/02/2011   TRIG 148 04/21/2013   CHOLHDL 3.6 04/21/2013  IMPRESSION: Dyslipidemia: therapy limited by noncompliance with medication taking.  i'll do the best i can. DM: poor control due to the below: Noncompliance with f/u appts, insulin taking, and cbg recording.  i'll do the best i can. PLAN: See instruction page

## 2013-04-21 NOTE — Patient Instructions (Addendum)
blood tests are being requested for you today.  We'll contact you with results.   please consider these measures for your health:  minimize alcohol.  do not use tobacco products.  have a colonoscopy at least every 10 years from age 61.  Women should have an annual mammogram from age 28.  keep firearms safely stored.  always use seat belts.  have working smoke alarms in your home.  see an eye doctor and dentist regularly.  never drive under the influence of alcohol or drugs (including prescription drugs).  those with fair skin should take precautions against the sun.  Please come back for a follow-up appointment in 3 months.   check your blood sugar twice a day.  vary the time of day when you check, between before the 3 meals, and at bedtime.  also check if you have symptoms of your blood sugar being too high or too low.  please keep a record of the readings and bring it to your next appointment here.  You can write it on any piece of paper.  please call us sooner if your blood sugar goes below 70, or if you have a lot of readings over 200.

## 2013-04-22 LAB — LIPID PANEL
CHOL/HDL RATIO: 3.6 ratio
Cholesterol: 239 mg/dL — ABNORMAL HIGH (ref 0–200)
HDL: 67 mg/dL (ref >39)
LDL CALC: 142 mg/dL — AB (ref 0–99)
Triglycerides: 148 mg/dL (ref <150)
VLDL: 30 mg/dL (ref 0–40)

## 2013-04-22 LAB — BASIC METABOLIC PANEL
BUN: 16 mg/dL (ref 6–23)
CALCIUM: 9.1 mg/dL (ref 8.4–10.5)
CO2: 25 mEq/L (ref 19–32)
Chloride: 99 mEq/L (ref 96–112)
Creat: 0.77 mg/dL (ref 0.50–1.10)
Glucose, Bld: 238 mg/dL — ABNORMAL HIGH (ref 70–99)
Potassium: 4.1 mEq/L (ref 3.5–5.3)
SODIUM: 135 meq/L (ref 135–145)

## 2013-04-22 LAB — MICROALBUMIN / CREATININE URINE RATIO
Creatinine, Urine: 91.9 mg/dL
MICROALB UR: 0.5 mg/dL (ref 0.00–1.89)
Microalb Creat Ratio: 5.4 mg/g (ref 0.0–30.0)

## 2013-04-22 LAB — URINALYSIS, ROUTINE W REFLEX MICROSCOPIC
Bilirubin Urine: NEGATIVE
Glucose, UA: >1000 mg/dL — AB
HGB URINE DIPSTICK: NEGATIVE
KETONES UR: NEGATIVE mg/dL
Leukocytes, UA: NEGATIVE
NITRITE: NEGATIVE
PH: 5.5 (ref 5.0–8.0)
PROTEIN: NEGATIVE mg/dL
Specific Gravity, Urine: >1.03 — ABNORMAL HIGH (ref 1.005–1.030)
Urobilinogen, UA: 0.2 mg/dL (ref 0.0–1.0)

## 2013-04-22 LAB — URINALYSIS, MICROSCOPIC ONLY
BACTERIA UA: NONE SEEN
CASTS: NONE SEEN
Crystals: NONE SEEN
Squamous Epithelial / LPF: NONE SEEN

## 2013-04-22 LAB — HEPATIC FUNCTION PANEL
ALK PHOS: 87 U/L (ref 39–117)
ALT: 16 U/L (ref 0–35)
AST: 17 U/L (ref 0–37)
Albumin: 3.8 g/dL (ref 3.5–5.2)
BILIRUBIN DIRECT: 0.1 mg/dL (ref 0.0–0.3)
BILIRUBIN INDIRECT: 0.5 mg/dL (ref 0.2–1.2)
Total Bilirubin: 0.6 mg/dL (ref 0.2–1.2)
Total Protein: 6.4 g/dL (ref 6.0–8.3)

## 2013-04-22 LAB — TSH: TSH: 1.689 u[IU]/mL (ref 0.350–4.500)

## 2013-05-24 ENCOUNTER — Ambulatory Visit: Payer: Managed Care, Other (non HMO) | Admitting: Endocrinology

## 2013-05-26 ENCOUNTER — Ambulatory Visit (INDEPENDENT_AMBULATORY_CARE_PROVIDER_SITE_OTHER): Payer: Managed Care, Other (non HMO) | Admitting: Endocrinology

## 2013-05-26 ENCOUNTER — Encounter: Payer: Self-pay | Admitting: Endocrinology

## 2013-05-26 VITALS — BP 118/70 | HR 91 | Temp 97.9°F | Ht 65.0 in | Wt 255.0 lb

## 2013-05-26 DIAGNOSIS — E109 Type 1 diabetes mellitus without complications: Secondary | ICD-10-CM

## 2013-05-26 NOTE — Patient Instructions (Addendum)
Please come back for a follow-up appointment in 3 months.   check your blood sugar twice a day.  vary the time of day when you check, between before the 3 meals, and at bedtime.  also check if you have symptoms of your blood sugar being too high or too low.  please keep a record of the readings and bring it to your next appointment here.  You can write it on any piece of paper.  please call us sooner if your blood sugar goes below 70, or if you have a lot of readings over 200.   After you use up the lantus, you should change to a faster-acting insulin, such as levemir or NPH.  Please call when you get low, so we can change.

## 2013-05-26 NOTE — Progress Notes (Signed)
Subjective:    Patient ID: Katherine Costa, female    DOB: 1950/07/29, 63 y.o.   MRN: [id number]  HPI Pt returns for f/u of insulin-requiring DM (dx'ed 1991; she has mild if any neuropathy of the lower extremities; no known associated chronic complications; she was rx'ed with orals until 1997, when she developed DKA, and was started on insulin; she has never had an episode of severe hypoglycemia).  Pt continues to self-adjust her insulin dosage.  Most of the time, she takes lantus, 42 units bid, and a widely varying dosage of humalog.  she brings a record of her cbg's which i have reviewed today.  It varies from 49-300's.  It is in general higher as the day goes on.  She has a large supply of lantus she wants to use up.   Past Medical History  Diagnosis Date  . DIABETES MELLITUS, TYPE I 08/24/2006  . HYPERCHOLESTEROLEMIA 02/09/2008  . HYPERTENSION 08/24/2006  . HERPES LABIALIS 03/13/2009  . ASYMPTOMATIC POSTMENOPAUSAL STATUS 05/21/2009  . LIVER DISORDER 03/17/2010  . Arthritis   . Hyperlipidemia     Past Surgical History  Procedure Laterality Date  . Cholecystectomy  2001  . Abdominal hysterectomy      History   Social History  . Marital Status: Married    Spouse Name: N/A    Number of Children: 3  . 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 Smoking status: Never Smoker   . Smokeless tobacco: Not on file  . Alcohol Use: No  . Drug Use: No  . Sexual Activity:    Other Topics Concern  . Not on file   Social History Narrative   PT does not get regular exercise   3 girls    Current Outpatient Prescriptions on File Prior to Visit  Medication Sig Dispense Refill  . aspirin 81 MG tablet Take 81 mg by mouth daily.        . B-D INS SYR ULTRAFINE .5CC/30G 30G X 1/2" 0.5 ML MISC USE FOUR TIMES A DAY  360 each  3  . clotrimazole-betamethasone (LOTRISONE) cream APPLY TOPICALLY THREE TIMES A DAY AS NEEDED FOR RASH   1 g  0  . colestipol (MICRONIZED COLESTIPOL HCL) 1 G tablet Take 3 tablets (3 g total) by mouth daily.  270 tablet  3  . gabapentin (NEURONTIN) 300 MG capsule Take 1 capsule (300 mg total) by mouth at bedtime.  90 capsule  3  . glucose blood (ONE TOUCH ULTRA TEST) test strip 1 each by Other route 2 (two) times daily. And lancets 2/day 250.01  180 each  3  . insulin glargine (LANTUS) 100 UNIT/ML injection INJECT 90 UNITS INTO THE SKIN AT BEDTIME  9 vial  3  . insulin lispro (HUMALOG) 100 UNIT/ML injection Inject 0.3 mLs (30 Units total) into the skin daily before supper.  30 mL  3  . lisinopril (PRINIVIL,ZESTRIL) 5 MG tablet Take 1 tablet (5 mg total) by mouth daily.  90 tablet  3  . simvastatin (ZOCOR) 80 MG tablet Take 1 tablet (80 mg total) by mouth at bedtime.  90 tablet  3   No current facility-administered medications on file prior to visit.    Allergies  Allergen Reactions  . Pioglitazone     REACTION: Edema    Family History  Problem Relation Age of Onset  . Cancer Mother     Colon Cancer,  Pancreatic Cancer  . Cancer Sister     Colon Cancer  . Diabetes Sister   . Cancer Sister     Breast Cancer    BP 118/70  Pulse 91  Temp(Src) 97.9 F (36.6 C) (Oral)  Ht 5\' 5"  (1.651 m)  Wt 255 lb (115.667 kg)  BMI 42.43 kg/m2  SpO2 97%  Review of Systems Denies LOC.  She has lost a few lbs.      Objective:   Physical Exam VITAL SIGNS:  See vs page GENERAL: no distress SKIN:  Insulin injection sites at the anterior abdomen are normal, except for a few ecchymoses.    Lab Results  Component Value Date   HGBA1C 11.3* 04/21/2013      Assessment & Plan:  DM: ongoing poor control Noncompliance with insulin dosing, persistent.  She won't achieve good glycemic control until this improves.  She needs a simple insulin schedule.

## 2013-07-10 ENCOUNTER — Other Ambulatory Visit: Payer: Self-pay | Admitting: Endocrinology

## 2013-07-11 ENCOUNTER — Other Ambulatory Visit: Payer: Self-pay | Admitting: *Deleted

## 2013-07-11 ENCOUNTER — Encounter: Payer: Self-pay | Admitting: *Deleted

## 2013-07-11 MED ORDER — CLOTRIMAZOLE-BETAMETHASONE 1-0.05 % EX CREA: TOPICAL_CREAM | CUTANEOUS | Status: DC

## 2013-07-20 ENCOUNTER — Encounter: Payer: Self-pay | Admitting: Gastroenterology

## 2013-09-08 ENCOUNTER — Ambulatory Visit: Payer: Managed Care, Other (non HMO) | Admitting: Endocrinology

## 2013-09-26 ENCOUNTER — Ambulatory Visit (INDEPENDENT_AMBULATORY_CARE_PROVIDER_SITE_OTHER): Payer: Managed Care, Other (non HMO) | Admitting: Endocrinology

## 2013-09-26 ENCOUNTER — Encounter: Payer: Self-pay | Admitting: Endocrinology

## 2013-09-26 VITALS — BP 116/60 | HR 80 | Temp 97.8°F | Ht 65.0 in | Wt 251.0 lb

## 2013-09-26 DIAGNOSIS — E109 Type 1 diabetes mellitus without complications: Secondary | ICD-10-CM

## 2013-09-26 MED ORDER — INSULIN NPH (HUMAN) (ISOPHANE) 100 UNIT/ML ~~LOC~~ SUSP: 100.0000 [IU] | SUBCUTANEOUS | Status: DC

## 2013-09-26 NOTE — Progress Notes (Signed)
Subjective:    Patient ID: Katherine Costa, female    DOB: Jul 02, 1950, 63 y.o.   MRN: [id number]  HPI Pt returns for f/u of insulin-requiring DM (dx'ed 1991; she has mild if any neuropathy of the lower extremities; no known associated chronic complications; she was rx'ed with orals until 1997, when she developed DKA, and was started on insulin; she has never had pancreatitis or severe hypoglycemia; she declines weight-loss surgery).  no cbg record, but states cbg's vary from 115-230.  She often misses the humalog.  She often misses lunch.  pt states she feels well in general.   Past Medical History  Diagnosis Date  . DIABETES MELLITUS, TYPE I 08/24/2006  . HYPERCHOLESTEROLEMIA 02/09/2008  . HYPERTENSION 08/24/2006  . HERPES LABIALIS 03/13/2009  . ASYMPTOMATIC POSTMENOPAUSAL STATUS 05/21/2009  . LIVER DISORDER 03/17/2010  . Arthritis   . Hyperlipidemia   . Tubulovillous adenoma of colon     2010   . Family history of colon cancer     Past Surgical History  Procedure Laterality Date  . Cholecystectomy  2001  . Abdominal hysterectomy      History   Social History  . Marital Status: Married    Spouse Name: N/A    Number of Children: 3  . 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 Smoking status: Never Smoker   . Smokeless tobacco: Not on file  . Alcohol Use: No  . Drug Use: No  . Sexual Activity:    Other Topics Concern  . Not on file   Social History Narrative   PT does not get regular exercise   3 girls    Current Outpatient Prescriptions on File Prior to Visit  Medication Sig Dispense Refill  . aspirin 81 MG tablet Take 81 mg by mouth daily.        . B-D INS SYR ULTRAFINE .5CC/30G 30G X 1/2" 0.5 ML MISC USE FOUR TIMES A DAY  360 each  3  . clotrimazole-betamethasone (LOTRISONE) cream APPLY TOPICALLY THREE TIMES A DAY AS NEEDED FOR RASH.  1 g  0  . colestipol (MICRONIZED COLESTIPOL HCL) 1 G tablet  Take 3 tablets (3 g total) by mouth daily.  270 tablet  3  . gabapentin (NEURONTIN) 300 MG capsule Take 1 capsule (300 mg total) by mouth at bedtime.  90 capsule  3  . glucose blood (ONE TOUCH ULTRA TEST) test strip 1 each by Other route 2 (two) times daily. And lancets 2/day 250.01  180 each  3  . lisinopril (PRINIVIL,ZESTRIL) 5 MG tablet Take 1 tablet (5 mg total) by mouth daily.  90 tablet  3  . simvastatin (ZOCOR) 80 MG tablet Take 1 tablet (80 mg total) by mouth at bedtime.  90 tablet  3   No current facility-administered medications on file prior to visit.    Allergies  Allergen Reactions  . Pioglitazone     REACTION: Edema    Family History  Problem Relation Age of Onset  . Cancer Mother     Colon Cancer, Pancreatic Cancer  . Cancer Sister     Colon Cancer  . Diabetes Sister   . Cancer Sister     Breast Cancer    BP 116/60  Pulse 80  Temp(Src) 97.8 F (36.6 C) (Oral)  Ht 5\' 5"  (1.651 m)  Wt 251 lb (113.853 kg)  BMI 41.77 kg/m2  SpO2 97%  Review of Systems She denies hypoglycemia and weight change.      Objective:   Physical Exam VITAL SIGNS:  See vs page GENERAL: no distress.  Morbid oesity Pulses: dorsalis pedis intact bilat.   Feet: no deformity. normal color and temp.  no edema Skin:  no ulcer on the feet.   Neuro: sensation is intact to touch on the feet. Lab Results  Component Value Date   HGBA1C 10.9* 09/26/2013       Assessment & Plan:  DM: severe exacerbation.   Noncompliance with cbg recording and insulin doses: I'll work around this as best I can--she needs a simpler regimen.     Patient is advised the following: Patient Instructions  Please come back for a follow-up appointment in 2 months.     check your blood sugar twice a day.  vary the time of day when you check, between before the 3 meals, and at bedtime.  also check if you have symptoms of your blood sugar being too high or too low.  please keep a record of the readings and bring it to  your next appointment here.  You can write it on any piece of paper.  please call us sooner if your blood sugar goes below 70, or if you have a lot of readings over 200.   Please change both insulins to "NPH," 100 units each morning.  This is probably not enough, so please call if the blood sugar is high. On this type of insulin schedule, you should eat meals on a regular schedule, especially lunch.  If a meal is missed or significantly delayed, your blood sugar could go low.

## 2013-09-26 NOTE — Patient Instructions (Addendum)
Please come back for a follow-up appointment in 2 months.     check your blood sugar twice a day.  vary the time of day when you check, between before the 3 meals, and at bedtime.  also check if you have symptoms of your blood sugar being too high or too low.  please keep a record of the readings and bring it to your next appointment here.  You can write it on any piece of paper.  please call us sooner if your blood sugar goes below 70, or if you have a lot of readings over 200.   Please change both insulins to "NPH," 100 units each morning.  This is probably not enough, so please call if the blood sugar is high. On this type of insulin schedule, you should eat meals on a regular schedule, especially lunch.  If a meal is missed or significantly delayed, your blood sugar could go low.

## 2013-09-27 LAB — HEMOGLOBIN A1C
HEMOGLOBIN A1C: 10.9 % — AB (ref <5.7)
MEAN PLASMA GLUCOSE: 266 mg/dL — AB (ref <117)

## 2013-10-31 ENCOUNTER — Ambulatory Visit (INDEPENDENT_AMBULATORY_CARE_PROVIDER_SITE_OTHER): Payer: Managed Care, Other (non HMO)

## 2013-10-31 DIAGNOSIS — Z23 Encounter for immunization: Secondary | ICD-10-CM

## 2013-11-27 ENCOUNTER — Ambulatory Visit (INDEPENDENT_AMBULATORY_CARE_PROVIDER_SITE_OTHER): Payer: Managed Care, Other (non HMO) | Admitting: Endocrinology

## 2013-11-27 ENCOUNTER — Encounter: Payer: Self-pay | Admitting: Endocrinology

## 2013-11-27 VITALS — BP 138/69 | HR 80 | Temp 98.1°F | Ht 65.0 in | Wt 258.0 lb

## 2013-11-27 DIAGNOSIS — E109 Type 1 diabetes mellitus without complications: Secondary | ICD-10-CM | POA: Diagnosis not present

## 2013-11-27 LAB — HEMOGLOBIN A1C: Hgb A1c MFr Bld: 10.4 % — ABNORMAL HIGH (ref 4.6–6.5)

## 2013-11-27 NOTE — Progress Notes (Signed)
Subjective:    Patient ID: Katherine Costa, female    DOB: 1950/08/20, 63 y.o.   MRN: [id number]  HPI  Pt returns for f/u of diabetes mellitus: DM type: 1 Dx'ed: 1025 Complications: none Therapy: insulin since 1997 GDM: never DKA: once (1997) Severe hypoglycemia: never Pancreatitis: never Other: she declines weight-loss surgery; in 2015, she was changed to qd insulin, as she could not remember multiple daily injections. Interval history: no cbg record, but states cbg's are well-controlled.  pt states she feels well in general.   Past Medical History  Diagnosis Date  . DIABETES MELLITUS, TYPE I 08/24/2006  . HYPERCHOLESTEROLEMIA 02/09/2008  . HYPERTENSION 08/24/2006  . HERPES LABIALIS 03/13/2009  . ASYMPTOMATIC POSTMENOPAUSAL STATUS 05/21/2009  . LIVER DISORDER 03/17/2010  . Arthritis   . Hyperlipidemia   . Tubulovillous adenoma of colon     2010   . Family history of colon cancer     Past Surgical History  Procedure Laterality Date  . Cholecystectomy  2001  . Abdominal hysterectomy      History   Social History  . Marital Status: Married    Spouse Name: N/A    Number of Children: 3  . 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 Smoking status: Never Smoker   . Smokeless tobacco: Not on file  . Alcohol Use: No  . Drug Use: No  . Sexual Activity:    Other Topics Concern  . Not on file   Social History Narrative   PT does not get regular exercise   3 girls    Current Outpatient Prescriptions on File Prior to Visit  Medication Sig Dispense Refill  . aspirin 81 MG tablet Take 81 mg by mouth daily.      . B-D INS SYR ULTRAFINE .5CC/30G 30G X 1/2" 0.5 ML MISC USE FOUR TIMES A DAY 360 each 3  . clotrimazole-betamethasone (LOTRISONE) cream APPLY TOPICALLY THREE TIMES A DAY AS NEEDED FOR RASH. 1 g 0  . colestipol (MICRONIZED COLESTIPOL HCL) 1 G tablet Take 3 tablets (3 g total) by mouth daily. 270  tablet 3  . gabapentin (NEURONTIN) 300 MG capsule Take 1 capsule (300 mg total) by mouth at bedtime. 90 capsule 3  . glucose blood (ONE TOUCH ULTRA TEST) test strip 1 each by Other route 2 (two) times daily. And lancets 2/day 250.01 180 each 3  . insulin NPH Human (NOVOLIN N) 100 UNIT/ML injection Inject 1 mL (100 Units total) into the skin every morning. 100 mL 11  . lisinopril (PRINIVIL,ZESTRIL) 5 MG tablet Take 1 tablet (5 mg total) by mouth daily. 90 tablet 3  . simvastatin (ZOCOR) 80 MG tablet Take 1 tablet (80 mg total) by mouth at bedtime. 90 tablet 3   No current facility-administered medications on file prior to visit.    Allergies  Allergen Reactions  . Pioglitazone     REACTION: Edema    Family History  Problem Relation Age of Onset  . Cancer Mother     Colon Cancer, Pancreatic Cancer  . Cancer Sister     Colon Cancer  . Diabetes Sister   . Cancer Sister     Breast Cancer    BP 138/69 mmHg  Pulse 80  Temp(Src) 98.1 F (36.7 C) (Oral)  Ht 5\' 5"  (1.651 m)  Wt 258 lb (117.028 kg)  BMI 42.93 kg/m2  SpO2 97%  Review of Systems She  denies hypoglycemia and weight change.     Objective:   Physical Exam VITAL SIGNS:  See vs page GENERAL: no distress Pulses: dorsalis pedis intact bilat.   Feet: no deformity. normal color and temp. no edema  Skin: no ulcer on the feet.   Neuro: sensation is intact to touch on the feet   Lab Results  Component Value Date   HGBA1C 10.4* 11/27/2013       Assessment & Plan:  DM: severe exacerbation.   Noncompliance with cbg recording: I'll work around this as best I can, but a1c is much worse than reported cbg's would suggest.    Patient is advised the following: Patient Instructions  Please come back for a follow-up appointment in 3 months.     check your blood sugar twice a day.  vary the time of day when you check, between before the 3 meals, and at bedtime.  also check if you have symptoms of your blood sugar being too  high or too low.  please keep a record of the readings and bring it to your next appointment here.  You can write it on any piece of paper.  please call us sooner if your blood sugar goes below 70, or if you have a lot of readings over 200.   blood tests are being requested for you today.  We'll contact you with results.  Please continue the same insulin.  On this type of insulin schedule, you should eat meals on a regular schedule, especially lunch.  If a meal is missed or significantly delayed, your blood sugar could go low.

## 2013-11-27 NOTE — Patient Instructions (Addendum)
Please come back for a follow-up appointment in 3 months.     check your blood sugar twice a day.  vary the time of day when you check, between before the 3 meals, and at bedtime.  also check if you have symptoms of your blood sugar being too high or too low.  please keep a record of the readings and bring it to your next appointment here.  You can write it on any piece of paper.  please call us sooner if your blood sugar goes below 70, or if you have a lot of readings over 200.   blood tests are being requested for you today.  We'll contact you with results.  Please continue the same insulin.  On this type of insulin schedule, you should eat meals on a regular schedule, especially lunch.  If a meal is missed or significantly delayed, your blood sugar could go low.

## 2014-02-20 ENCOUNTER — Encounter: Payer: Self-pay | Admitting: Endocrinology

## 2014-02-27 ENCOUNTER — Ambulatory Visit (INDEPENDENT_AMBULATORY_CARE_PROVIDER_SITE_OTHER): Payer: Managed Care, Other (non HMO) | Admitting: Endocrinology

## 2014-02-27 VITALS — BP 142/88 | HR 87 | Temp 97.8°F | Wt 260.0 lb

## 2014-02-27 DIAGNOSIS — E109 Type 1 diabetes mellitus without complications: Secondary | ICD-10-CM

## 2014-02-27 LAB — HEMOGLOBIN A1C: HEMOGLOBIN A1C: 10.8 % — AB (ref 4.6–6.5)

## 2014-02-27 NOTE — Patient Instructions (Addendum)
Please come back for a regular physical appointment in 3 months.   check your blood sugar twice a day.  vary the time of day when you check, between before the 3 meals, and at bedtime.  also check if you have symptoms of your blood sugar being too high or too low.  please keep a record of the readings and bring it to your next appointment here.  You can write it on any piece of paper.  please call us sooner if your blood sugar goes below 70, or if you have a lot of readings over 200.   blood tests are being requested for you today.  We'll contact you with results.   Based on the results, we may need to increase the insulin on days when you are less active, but continue the 100 units when you are more active.  i'll let you know.  On this type of insulin schedule, you should eat meals on a regular schedule, especially lunch.  If a meal is missed or significantly delayed, your blood sugar could go low.

## 2014-02-27 NOTE — Progress Notes (Signed)
Subjective:    Patient ID: Katherine Costa, female    DOB: Mar 26, 1950, 64 y.o.   MRN: [id number]  HPI Pt returns for f/u of diabetes mellitus: DM type: 1 Dx'ed: 8882 Complications: none Therapy: insulin since 1997 GDM: never DKA: once (1997) Severe hypoglycemia: never Pancreatitis: never Other: she declines weight-loss surgery; in 2015, she was changed to qd insulin, as she could not remember multiple daily injections. Interval history: no cbg record, but states cbg's are in the 200's.  There is no trend throughout the day.  pt states she feels well in general.  Pt says she never misses the insulin. Past Medical History  Diagnosis Date  . DIABETES MELLITUS, TYPE I 08/24/2006  . HYPERCHOLESTEROLEMIA 02/09/2008  . HYPERTENSION 08/24/2006  . HERPES LABIALIS 03/13/2009  . ASYMPTOMATIC POSTMENOPAUSAL STATUS 05/21/2009  . LIVER DISORDER 03/17/2010  . Arthritis   . Hyperlipidemia   . Tubulovillous adenoma of colon     2010   . Family history of colon cancer     Past Surgical History  Procedure Laterality Date  . Cholecystectomy  2001  . Abdominal hysterectomy      History   Social History  . Marital Status: Married    Spouse Name: N/A    Number of Children: 3  . 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 Smoking status: Never Smoker   . Smokeless tobacco: Not on file  . Alcohol Use: No  . Drug Use: No  . Sexual Activity: Not on file   Other Topics Concern  . Not on file   Social History Narrative   PT does not get regular exercise   3 girls    Current Outpatient Prescriptions on File Prior to Visit  Medication Sig Dispense Refill  . aspirin 81 MG tablet Take 81 mg by mouth daily.      . B-D INS SYR ULTRAFINE .5CC/30G 30G X 1/2" 0.5 ML MISC USE FOUR TIMES A DAY 360 each 3  . clotrimazole-betamethasone (LOTRISONE) cream APPLY TOPICALLY THREE TIMES A DAY AS NEEDED FOR RASH. 1 g 0  . colestipol  (MICRONIZED COLESTIPOL HCL) 1 G tablet Take 3 tablets (3 g total) by mouth daily. 270 tablet 3  . gabapentin (NEURONTIN) 300 MG capsule Take 1 capsule (300 mg total) by mouth at bedtime. 90 capsule 3  . glucose blood (ONE TOUCH ULTRA TEST) test strip 1 each by Other route 2 (two) times daily. And lancets 2/day 250.01 180 each 3  . insulin NPH Human (NOVOLIN N) 100 UNIT/ML injection Inject 1 mL (100 Units total) into the skin every morning. 100 mL 11  . lisinopril (PRINIVIL,ZESTRIL) 5 MG tablet Take 1 tablet (5 mg total) by mouth daily. 90 tablet 3  . simvastatin (ZOCOR) 80 MG tablet Take 1 tablet (80 mg total) by mouth at bedtime. 90 tablet 3   No current facility-administered medications on file prior to visit.    Allergies  Allergen Reactions  . Pioglitazone     REACTION: Edema    Family History  Problem Relation Age of Onset  . Cancer Mother     Colon Cancer, Pancreatic Cancer  . Cancer Sister     Colon Cancer  . Diabetes Sister   . Cancer Sister     Breast Cancer    BP 142/88 mmHg  Pulse 87  Temp(Src) 97.8 F (36.6 C) (Oral)  Wt 260 lb (117.935 kg)  SpO2 98%   Review of Systems She denies hypoglycemia and weight change.    Objective:   Physical Exam VITAL SIGNS:  See vs page GENERAL: no distress Pulses: dorsalis pedis intact bilat.   MSK: no deformity of the feet CV: no leg edema Skin:  no ulcer on the feet.  normal color and temp on the feet. Neuro: sensation is intact to touch on the feet.   Ext: There is bilateral onychomycosis  Lab Results  Component Value Date   HGBA1C 10.8* 02/27/2014       Assessment & Plan:  DM: severe exacerbation Noncompliance with cbg recording: I'll work around this as best I can HTN: with possible situational component:  We'll follow for now   Patient is advised the following: Patient Instructions  Please come back for a regular physical appointment in 3 months.   check your blood sugar twice a day.  vary the time of  day when you check, between before the 3 meals, and at bedtime.  also check if you have symptoms of your blood sugar being too high or too low.  please keep a record of the readings and bring it to your next appointment here.  You can write it on any piece of paper.  please call us sooner if your blood sugar goes below 70, or if you have a lot of readings over 200.   blood tests are being requested for you today.  We'll contact you with results.   Based on the results, we may need to increase the insulin on days when you are less active, but continue the 100 units when you are more active.  i'll let you know.  On this type of insulin schedule, you should eat meals on a regular schedule, especially lunch.  If a meal is missed or significantly delayed, your blood sugar could go low.    addendum: increase insulin to 140 units qam However, if you are going to be active, take just 110 units

## 2014-03-06 ENCOUNTER — Encounter: Payer: Self-pay | Admitting: Endocrinology

## 2014-04-28 ENCOUNTER — Other Ambulatory Visit: Payer: Self-pay | Admitting: Endocrinology

## 2014-06-01 ENCOUNTER — Encounter: Payer: Self-pay | Admitting: Endocrinology

## 2014-06-01 ENCOUNTER — Ambulatory Visit (INDEPENDENT_AMBULATORY_CARE_PROVIDER_SITE_OTHER): Payer: Managed Care, Other (non HMO) | Admitting: Endocrinology

## 2014-06-01 ENCOUNTER — Telehealth: Payer: Self-pay | Admitting: Family Medicine

## 2014-06-01 VITALS — BP 136/88 | HR 83 | Temp 97.9°F | Wt 255.0 lb

## 2014-06-01 DIAGNOSIS — Z119 Encounter for screening for infectious and parasitic diseases, unspecified: Secondary | ICD-10-CM | POA: Insufficient documentation

## 2014-06-01 DIAGNOSIS — Z0189 Encounter for other specified special examinations: Secondary | ICD-10-CM

## 2014-06-01 DIAGNOSIS — E7800 Pure hypercholesterolemia, unspecified: Secondary | ICD-10-CM

## 2014-06-01 DIAGNOSIS — E109 Type 1 diabetes mellitus without complications: Secondary | ICD-10-CM

## 2014-06-01 DIAGNOSIS — M25511 Pain in right shoulder: Secondary | ICD-10-CM

## 2014-06-01 DIAGNOSIS — Z0000 Encounter for general adult medical examination without abnormal findings: Secondary | ICD-10-CM

## 2014-06-01 DIAGNOSIS — I10 Essential (primary) hypertension: Secondary | ICD-10-CM | POA: Diagnosis not present

## 2014-06-01 DIAGNOSIS — M25519 Pain in unspecified shoulder: Secondary | ICD-10-CM | POA: Insufficient documentation

## 2014-06-01 NOTE — Patient Instructions (Addendum)
Please come back for a regular physical appointment in 3 months.   check your blood sugar twice a day.  vary the time of day when you check, between before the 3 meals, and at bedtime.  also check if you have symptoms of your blood sugar being too high or too low.  please keep a record of the readings and bring it to your next appointment here.  You can write it on any piece of paper.  please call us sooner if your blood sugar goes below 70, or if you have a lot of readings over 200.   blood tests are being requested for you today.  We'll contact you with results.   Based on the results, we may need to increase the insulin on days when you are less active, but continue the 100 units when you are more active.  i'll let you know.  On this type of insulin schedule, you should eat meals on a regular schedule, especially lunch.  If a meal is missed or significantly delayed, your blood sugar could go low.    Please see an oprthopedic specialist.  you will receive a phone call, about a day and time for an appointment.

## 2014-06-01 NOTE — Telephone Encounter (Signed)
On call note: [facility] called regarding pt being at there lab but has no orders for labs at [facility]. Review of chart orders show that she had labs ordered today as "future" for [name]. Lab worker at [facility] told pt that this was the case.  Pt left and said she would call her MD's office Monday to clarify.

## 2014-06-01 NOTE — Progress Notes (Signed)
Subjective:    Patient ID: Katherine Costa, female    DOB: [birth year], 64 y.o.   MRN: [id number]  HPI Pt returns for f/u of diabetes mellitus: DM type: 1 Dx'ed: 2505 Complications: none Therapy: insulin since 1997. GDM: never DKA: once (1997) Severe hypoglycemia: never Pancreatitis: never Other: she declines weight-loss surgery; in 2015, she was changed to qd insulin, as she could not remember multiple daily injections. Interval history: Pt says she never misses the insulin.  She takes 110 units qam.  She says she cannot take more, due to hypoglycemia.  no cbg record, but states cbg's vary from 80-300.  Pt states 1 month of pain at the right shoulder, but no assoc numbness. Past Medical History  Diagnosis Date  . DIABETES MELLITUS, TYPE I 08/24/2006  . HYPERCHOLESTEROLEMIA 02/09/2008  . HYPERTENSION 08/24/2006  . HERPES LABIALIS 03/13/2009  . ASYMPTOMATIC POSTMENOPAUSAL STATUS 05/21/2009  . LIVER DISORDER 03/17/2010  . Arthritis   . Hyperlipidemia   . Tubulovillous adenoma of colon     2010   . Family history of colon cancer     Past Surgical History  Procedure Laterality Date  . Cholecystectomy  2001  . Abdominal hysterectomy      History   Social History  . Marital Status: Married    Spouse Name: N/A  . Number of Children: 3  . 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 Smoking status: Never Smoker   . Smokeless tobacco: Not on file  . Alcohol Use: No  . Drug Use: No  . Sexual Activity: Not on file   Other Topics Concern  . Not on file   Social History Narrative   PT does not get regular exercise   3 girls    Current Outpatient Prescriptions on File Prior to Visit  Medication Sig Dispense Refill  . aspirin 81 MG tablet Take 81 mg by mouth daily.      . B-D INS SYR ULTRAFINE .5CC/30G 30G X 1/2" 0.5 ML MISC USE FOUR TIMES A DAY 360 each 3  . clotrimazole-betamethasone (LOTRISONE) cream  APPLY TOPICALLY THREE TIMES A DAY AS NEEDED FOR RASH. 1 g 0  . colestipol (MICRONIZED COLESTIPOL HCL) 1 G tablet Take 3 tablets (3 g total) by mouth daily. 270 tablet 3  . gabapentin (NEURONTIN) 300 MG capsule Take 1 capsule (300 mg total) by mouth at bedtime. 90 capsule 3  . glucose blood (ONE TOUCH ULTRA TEST) test strip 1 each by Other route 2 (two) times daily. And lancets 2/day 250.01 180 each 3  . insulin NPH Human (NOVOLIN N) 100 UNIT/ML injection Inject 1 mL (100 Units total) into the skin every morning. 100 mL 11  . lisinopril (PRINIVIL,ZESTRIL) 5 MG tablet TAKE 1 TABLET DAILY 90 tablet 2  . simvastatin (ZOCOR) 80 MG tablet Take 1 tablet (80 mg total) by mouth at bedtime. 90 tablet 3   No current facility-administered medications on file prior to visit.    Allergies  Allergen Reactions  . Pioglitazone     REACTION: Edema    Family History  Problem Relation Age of Onset  . Cancer Mother     Colon Cancer, Pancreatic Cancer  . Cancer Sister     Colon Cancer  . Diabetes Sister   . Cancer Sister     Breast Cancer    BP 136/88 mmHg  Pulse 83  Temp(Src) 97.9 F (36.6 C) (  Oral)  Wt 255 lb (115.667 kg)  SpO2 97%    Review of Systems She denies LOC.  She has lost weight, due to her efforts.      Objective:   Physical Exam VITAL SIGNS:  See vs page. GENERAL: no distress. Pulses: dorsalis pedis intact bilat.   MSK: no deformity of the feet.  CV: no leg edema. Skin:  no ulcer on the feet.  normal color and temp on the feet. Neuro: sensation is intact to touch on the feet. Right shoulder: full ROM, but ROM is painful.       Assessment & Plan:  DM: uncertain glycemic control Shoulder pain, new, uncertain etiology Obesity: improved.  Pt is advised to continue her dietary efforts.  Patient is advised the following: Patient Instructions  Please come back for a regular physical appointment in 3 months.   check your blood sugar twice a day.  vary the time of day when  you check, between before the 3 meals, and at bedtime.  also check if you have symptoms of your blood sugar being too high or too low.  please keep a record of the readings and bring it to your next appointment here.  You can write it on any piece of paper.  please call us sooner if your blood sugar goes below 70, or if you have a lot of readings over 200.   blood tests are being requested for you today.  We'll contact you with results.   Based on the results, we may need to increase the insulin on days when you are less active, but continue the 100 units when you are more active.  i'll let you know.  On this type of insulin schedule, you should eat meals on a regular schedule, especially lunch.  If a meal is missed or significantly delayed, your blood sugar could go low.    Please see an oprthopedic specialist.  you will receive a phone call, about a day and time for an appointment.

## 2014-06-04 ENCOUNTER — Telehealth: Payer: Self-pay | Admitting: Endocrinology

## 2014-06-04 NOTE — Telephone Encounter (Signed)
Patient would like for you to call her, phone # [phone] and ask for the cafeteria.

## 2014-06-04 NOTE — Telephone Encounter (Signed)
[facility] note dated 06/01/14 5:37 pm  Caller states [facility], does not have lab orders for patient. Who is in the office now.  Caller Raiann cb# [phone]  Brady Not Listed page oncall MD  PreDisposition Call Doctor

## 2014-06-04 NOTE — Telephone Encounter (Signed)
I tried to contact the patient unable to do so at this time.

## 2014-06-05 NOTE — Telephone Encounter (Signed)
I contacted the patient and advised the forms she had left for Dr. Loanne Drilling on 06/01/2014 were completed. Per pt's request form has been mailed to the patient.

## 2014-06-14 ENCOUNTER — Telehealth: Payer: Self-pay | Admitting: Endocrinology

## 2014-06-14 MED ORDER — INSULIN NPH (HUMAN) (ISOPHANE) 100 UNIT/ML ~~LOC~~ SUSP: 130.0000 [IU] | SUBCUTANEOUS | Status: DC

## 2014-06-14 MED ORDER — SIMVASTATIN 80 MG PO TABS: 80.0000 mg | ORAL_TABLET | Freq: Every day | ORAL | Status: DC

## 2014-06-14 NOTE — Telephone Encounter (Signed)
Left voicemail advising of note below. Requested call back if patient would like to discuss.

## 2014-06-14 NOTE — Telephone Encounter (Signed)
please call patient: Your cholesterol is high.  i have sent a prescription to your pharmacy, to refill simvastatin. Your blood sugar is high.  Take 130 units qam (on days yo are active, take just 100 units). i'll see you next time.

## 2014-07-01 ENCOUNTER — Other Ambulatory Visit: Payer: Self-pay | Admitting: Endocrinology

## 2014-07-02 ENCOUNTER — Encounter: Payer: Self-pay | Admitting: Endocrinology

## 2014-09-25 ENCOUNTER — Telehealth: Payer: Self-pay

## 2014-09-25 NOTE — Telephone Encounter (Signed)
Left pt a Vm to return call for pt to come and get A1c rechecked.

## 2014-09-26 ENCOUNTER — Telehealth: Payer: Self-pay | Admitting: Endocrinology

## 2014-09-26 NOTE — Telephone Encounter (Signed)
There is no need.  We can stick your finger while you are here.  Do you get it for free there?

## 2014-09-26 NOTE — Telephone Encounter (Signed)
Can we order a A1C and send to [facility] for the pt? Thanks!

## 2014-09-26 NOTE — Telephone Encounter (Signed)
Pt requesting we send her lab orders to [facility] in [place] P# [phone]

## 2014-09-26 NOTE — Telephone Encounter (Addendum)
Pt's husband advised of POCT a1c and voiced understanding. Pt will have this done at her next office visit.

## 2014-11-12 ENCOUNTER — Telehealth: Payer: Self-pay | Admitting: Endocrinology

## 2014-11-12 NOTE — Telephone Encounter (Signed)
please call patient: Ov is due 

## 2014-11-13 NOTE — Telephone Encounter (Signed)
Left the pt a VM to return call for appt

## 2014-12-11 ENCOUNTER — Encounter: Payer: Self-pay | Admitting: Endocrinology

## 2014-12-11 ENCOUNTER — Ambulatory Visit (INDEPENDENT_AMBULATORY_CARE_PROVIDER_SITE_OTHER): Payer: Managed Care, Other (non HMO) | Admitting: Endocrinology

## 2014-12-11 VITALS — BP 127/86 | HR 89 | Temp 98.1°F | Wt 248.0 lb

## 2014-12-11 DIAGNOSIS — I10 Essential (primary) hypertension: Secondary | ICD-10-CM

## 2014-12-11 DIAGNOSIS — Z0001 Encounter for general adult medical examination with abnormal findings: Secondary | ICD-10-CM | POA: Diagnosis not present

## 2014-12-11 DIAGNOSIS — E7800 Pure hypercholesterolemia, unspecified: Secondary | ICD-10-CM

## 2014-12-11 DIAGNOSIS — E109 Type 1 diabetes mellitus without complications: Secondary | ICD-10-CM

## 2014-12-11 DIAGNOSIS — Z23 Encounter for immunization: Secondary | ICD-10-CM | POA: Diagnosis not present

## 2014-12-11 DIAGNOSIS — Z0000 Encounter for general adult medical examination without abnormal findings: Secondary | ICD-10-CM

## 2014-12-11 LAB — POCT GLYCOSYLATED HEMOGLOBIN (HGB A1C): Hemoglobin A1C: 10.1

## 2014-12-11 MED ORDER — NAPROXEN 250 MG PO TABS: 250.0000 mg | ORAL_TABLET | ORAL | Status: DC

## 2014-12-11 MED ORDER — OMEPRAZOLE 40 MG PO CPDR: 40.0000 mg | DELAYED_RELEASE_CAPSULE | Freq: Every day | ORAL | Status: DC

## 2014-12-11 NOTE — Progress Notes (Signed)
Subjective:    Patient ID: Katherine Costa, female    DOB: October 10, 1950, 64 y.o.   MRN: [id number]  HPI Pt is here for regular wellness examination, and is feeling pretty well in general, and says chronic med probs are stable, except as noted below Past Medical History  Diagnosis Date  . DIABETES MELLITUS, TYPE I 08/24/2006  . HYPERCHOLESTEROLEMIA 02/09/2008  . HYPERTENSION 08/24/2006  . HERPES LABIALIS 03/13/2009  . ASYMPTOMATIC POSTMENOPAUSAL STATUS 05/21/2009  . LIVER DISORDER 03/17/2010  . Arthritis   . Hyperlipidemia   . Tubulovillous adenoma of colon     2010   . Family history of colon cancer     Past Surgical History  Procedure Laterality Date  . Cholecystectomy  2001  . Abdominal hysterectomy      Social History   Social History  . Marital Status: Married    Spouse Name: N/A  . Number of Children: 3  . 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 Smoking status: Never Smoker   . Smokeless tobacco: Not on file  . Alcohol Use: No  . Drug Use: No  . Sexual Activity: Not on file   Other Topics Concern  . Not on file   Social History Narrative   PT does not get regular exercise   3 girls    Current Outpatient Prescriptions on File Prior to Visit  Medication Sig Dispense Refill  . aspirin 81 MG tablet Take 81 mg by mouth daily.      . B-D INS SYR ULTRAFINE .5CC/30G 30G X 1/2" 0.5 ML MISC USE FOUR TIMES A DAY 360 each 3  . clotrimazole-betamethasone (LOTRISONE) cream APPLY TOPICALLY THREE TIMES A DAY AS NEEDED FOR RASH. 1 g 0  . glucose blood (ONE TOUCH ULTRA TEST) test strip 1 each by Other route 2 (two) times daily. And lancets 2/day 250.01 180 each 3  . insulin NPH Human (NOVOLIN N) 100 UNIT/ML injection Inject 1.3 mLs (130 Units total) into the skin every morning. 130 mL 11  . lisinopril (PRINIVIL,ZESTRIL) 5 MG tablet TAKE 1 TABLET DAILY 90 tablet 2   No current facility-administered  medications on file prior to visit.    Allergies  Allergen Reactions  . Pioglitazone     REACTION: Edema    Family History  Problem Relation Age of Onset  . Cancer Mother     Colon Cancer, Pancreatic Cancer  . Cancer Sister     Colon Cancer  . Diabetes Sister   . Cancer Sister     Breast Cancer    BP 127/86 mmHg  Pulse 89  Temp(Src) 98.1 F (36.7 C) (Oral)  Wt 248 lb (112.492 kg)  SpO2 95%  Review of Systems  Constitutional: Negative for fever.  HENT: Negative for hearing loss.   Eyes: Negative for visual disturbance.  Respiratory: Negative for shortness of breath.   Cardiovascular: Negative for chest pain.  Gastrointestinal: Negative for anal bleeding.  Endocrine: Negative for cold intolerance.  Genitourinary: Negative for hematuria.  Musculoskeletal: Negative for back pain.  Skin: Negative for rash.  Allergic/Immunologic: Negative for environmental allergies.  Neurological: Negative for syncope and numbness.  Psychiatric/Behavioral: Negative for dysphoric mood.       Objective:   Physical Exam VS: see vs page GEN: no distress HEAD: head: no deformity eyes: no periorbital swelling, no proptosis external nose and ears are normal mouth: no lesion seen NECK: supple, thyroid  is not enlarged CHEST WALL: no deformity LUNGS:  Clear to auscultation BREASTS: sees gyn.  CV: reg rate and rhythm, no murmur ABD: abdomen is soft, nontender.  no hepatosplenomegaly.  not distended.  no hernia GENITALIA/RECTAL: sees gyn MUSCULOSKELETAL: muscle bulk and strength are grossly normal.  no obvious joint swelling.  gait is normal and steady EXTEMITIES: no deformity.  no ulcer on the feet.  feet are of normal color and temp.  no edema PULSES: dorsalis pedis intact bilat.  no carotid bruit NEURO:  cn 2-12 grossly intact.   readily moves all 4's.  sensation is intact to touch on the feet SKIN:  Normal texture and temperature.  No rash or suspicious lesion is visible.   NODES:   None palpable at the neck PSYCH: alert, well-oriented.  Does not appear anxious nor depressed.        Assessment & Plan:  Wellness visit today, with problems stable, except as noted.    SEPARATE EVALUATION FOLLOWS--EACH PROBLEM HERE IS NEW, NOT RESPONDING TO TREATMENT, OR POSES SIGNIFICANT RISK TO THE PATIENT'S HEALTH: HISTORY OF THE PRESENT ILLNESS: Pt returns for f/u of diabetes mellitus: DM type: 1 Dx'ed: 99991111 Complications: none Therapy: insulin since 1997. GDM: never DKA: once (1997) Severe hypoglycemia: never Pancreatitis: never Other: she declines weight-loss surgery; in 2015, she was changed to qd insulin, as she could not remember multiple daily injections; she works 1st shift at [employer].   Interval history: Pt says she never misses the insulin.  She takes 100 units qam, because that is the capacity of the syringe.  no cbg record, but states cbg's are in the mid-100's in am, which is the only time of day she checks.   PAST MEDICAL HISTORY reviewed and up to date today REVIEW OF SYSTEMS:  Chronic diffuse arthralgias are not helped by ibuprofen.   PHYSICAL EXAMINATION: VITAL SIGNS:  See vs page GENERAL: no distress Pulses: dorsalis pedis intact bilat.   MSK: no deformity of the feet CV: trace bilat leg edema.  Skin:  no ulcer on the feet.  normal color and temp on the feet.  Neuro: sensation is intact to touch on the feet.  Ext; There is bilateral onychomycosis of the toenails.   LAB/XRAY RESULTS: A1c=10.1% Lab Results  Component Value Date   CHOL 201* 12/11/2014   HDL 78.30 12/11/2014   LDLCALC 103* 12/11/2014   LDLDIRECT 110.6 09/02/2011   TRIG 98.0 12/11/2014   CHOLHDL 3 12/11/2014   IMPRESSION:  DM: he needs increased rx Arthralgias, not improved. Dyslipidemia: ? compliance PLAN:  Please increase the lantus to 130 units each morning on non-work days (and 110 units on work days).  i have sent prescriptions to your pharmacy, for naproxen and  prilosec.  Take 1 of each, in the morning. If this doesn't help, i'll request the celebrex.  i have sent prescriptions to your pharmacy, to refill simvastatin and colestipol.

## 2014-12-11 NOTE — Patient Instructions (Addendum)
Please come back for a follow-up appointment in 2 months.   check your blood sugar twice a day.  vary the time of day when you check, between before the 3 meals, and at bedtime.  also check if you have symptoms of your blood sugar being too high or too low.  please keep a record of the readings and bring it to your next appointment here.  You can write it on any piece of paper.  please call us sooner if your blood sugar goes below 70, or if you have a lot of readings over 200.   On this type of insulin schedule, you should eat meals on a regular schedule, especially lunch.  If a meal is missed or significantly delayed, your blood sugar could go low.   Please increase the lantus to 130 units each morning on non-work days (and 110 units on work days).   i have sent prescriptions to your pharmacy, for naproxen and prilosec.  Take 1 of each, in the morning. If this doesn't help, i'll request the celebrex. please consider these measures for your health:  minimize alcohol.  do not use tobacco products.  have a colonoscopy at least every 10 years from age 51.  Women should have an annual mammogram from age 8.  keep firearms safely stored.  always use seat belts.  have working smoke alarms in your home.  see an eye doctor and dentist regularly.  never drive under the influence of alcohol or drugs (including prescription drugs).  those with fair skin should take precautions against the sun.

## 2014-12-12 LAB — URINALYSIS, ROUTINE W REFLEX MICROSCOPIC
BILIRUBIN URINE: NEGATIVE
Hgb urine dipstick: NEGATIVE
KETONES UR: NEGATIVE
LEUKOCYTES UA: NEGATIVE
Nitrite: NEGATIVE
PH: 5.5 (ref 5.0–8.0)
RBC / HPF: NONE SEEN (ref 0–?)
Specific Gravity, Urine: >=1.03 — AB (ref 1.000–1.030)
Total Protein, Urine: NEGATIVE
UROBILINOGEN UA: 0.2 (ref 0.0–1.0)
Urine Glucose: >1000 — AB
WBC, UA: NONE SEEN (ref 0–?)

## 2014-12-12 LAB — MICROALBUMIN / CREATININE URINE RATIO
Creatinine,U: 168.5 mg/dL
Microalb Creat Ratio: 0.5 mg/g (ref 0.0–30.0)
Microalb, Ur: 0.8 mg/dL (ref 0.0–1.9)

## 2014-12-12 LAB — HEPATIC FUNCTION PANEL
ALBUMIN: 3.8 g/dL (ref 3.5–5.2)
ALK PHOS: 83 U/L (ref 39–117)
ALT: 17 U/L (ref 0–35)
AST: 18 U/L (ref 0–37)
BILIRUBIN DIRECT: 0.1 mg/dL (ref 0.0–0.3)
TOTAL PROTEIN: 6.9 g/dL (ref 6.0–8.3)
Total Bilirubin: 0.4 mg/dL (ref 0.2–1.2)

## 2014-12-12 LAB — CBC WITH DIFFERENTIAL/PLATELET
BASOS ABS: 0 10*3/uL (ref 0.0–0.1)
Basophils Relative: 0.4 % (ref 0.0–3.0)
EOS ABS: 0.2 10*3/uL (ref 0.0–0.7)
Eosinophils Relative: 2.4 % (ref 0.0–5.0)
HEMATOCRIT: 44.1 % (ref 36.0–46.0)
Hemoglobin: 14.7 g/dL (ref 12.0–15.0)
LYMPHS ABS: 1.8 10*3/uL (ref 0.7–4.0)
Lymphocytes Relative: 21 % (ref 12.0–46.0)
MCHC: 33.3 g/dL (ref 30.0–36.0)
MCV: 88.4 fl (ref 78.0–100.0)
MONO ABS: 0.7 10*3/uL (ref 0.1–1.0)
MONOS PCT: 8 % (ref 3.0–12.0)
NEUTROS ABS: 5.8 10*3/uL (ref 1.4–7.7)
NEUTROS PCT: 68.2 % (ref 43.0–77.0)
PLATELETS: 315 10*3/uL (ref 150.0–400.0)
RBC: 4.99 Mil/uL (ref 3.87–5.11)
RDW: 13.5 % (ref 11.5–15.5)
WBC: 8.5 10*3/uL (ref 4.0–10.5)

## 2014-12-12 LAB — TSH: TSH: 1.87 u[IU]/mL (ref 0.35–4.50)

## 2014-12-13 LAB — LIPID PANEL
CHOL/HDL RATIO: 3
Cholesterol: 201 mg/dL — ABNORMAL HIGH (ref 0–200)
HDL: 78.3 mg/dL (ref >39.00)
LDL CALC: 103 mg/dL — AB (ref 0–99)
NonHDL: 122.38
Triglycerides: 98 mg/dL (ref 0.0–149.0)
VLDL: 19.6 mg/dL (ref 0.0–40.0)

## 2014-12-13 LAB — BASIC METABOLIC PANEL
BUN: 20 mg/dL (ref 6–23)
CALCIUM: 9.4 mg/dL (ref 8.4–10.5)
CO2: 26 meq/L (ref 19–32)
CREATININE: 0.87 mg/dL (ref 0.40–1.20)
Chloride: 102 mEq/L (ref 96–112)
GFR: 70.06 mL/min (ref >60.00)
GLUCOSE: 113 mg/dL — AB (ref 70–99)
Potassium: 4 mEq/L (ref 3.5–5.1)
Sodium: 137 mEq/L (ref 135–145)

## 2014-12-13 MED ORDER — COLESTIPOL HCL 1 G PO TABS: 3.0000 g | ORAL_TABLET | Freq: Every day | ORAL | Status: DC

## 2014-12-13 MED ORDER — SIMVASTATIN 80 MG PO TABS: 80.0000 mg | ORAL_TABLET | Freq: Every day | ORAL | Status: DC

## 2014-12-13 NOTE — Progress Notes (Signed)
   Subjective:    Patient ID: Katherine Costa, female    DOB: Dec 22, 1950, 64 y.o.   MRN: [id number]  HPI  i personally reviewed electrocardiogram tracing (today): Indication: DM Impression: normal  Review of Systems     Objective:   Physical Exam        Assessment & Plan:

## 2015-01-09 ENCOUNTER — Encounter: Payer: Self-pay | Admitting: Endocrinology

## 2015-01-21 ENCOUNTER — Other Ambulatory Visit: Payer: Self-pay | Admitting: Endocrinology

## 2015-04-25 ENCOUNTER — Other Ambulatory Visit: Payer: Self-pay

## 2015-04-25 ENCOUNTER — Telehealth: Payer: Self-pay

## 2015-04-25 MED ORDER — INSULIN NPH (HUMAN) (ISOPHANE) 100 UNIT/ML ~~LOC~~ SUSP: 130.0000 [IU] | SUBCUTANEOUS | Status: DC

## 2015-04-25 NOTE — Telephone Encounter (Signed)
Left message on machine in reference to flu shot survey call

## 2015-04-29 ENCOUNTER — Telehealth: Payer: Self-pay | Admitting: Endocrinology

## 2015-04-29 MED ORDER — "INSULIN SYRINGE-NEEDLE U-100 30G X 1/2"" 0.5 ML MISC": Status: DC

## 2015-04-29 NOTE — Telephone Encounter (Signed)
Misbah calling from [facility] she need claricfation on syringes for medication Novolin N, please advise [phone] ref - [id number]

## 2015-04-29 NOTE — Telephone Encounter (Signed)
Pharmacy notified what syringes to dispense and what size.

## 2015-05-15 ENCOUNTER — Other Ambulatory Visit: Payer: Self-pay

## 2015-05-15 MED ORDER — INSULIN NPH (HUMAN) (ISOPHANE) 100 UNIT/ML ~~LOC~~ SUSP: 110.0000 [IU] | SUBCUTANEOUS | Status: DC

## 2015-05-17 ENCOUNTER — Telehealth: Payer: Self-pay | Admitting: Endocrinology

## 2015-05-17 NOTE — Telephone Encounter (Signed)
PT said that she received Novolin N and was supposed to receive Humulin N and wants to know if this is correct or if they are the same

## 2015-05-17 NOTE — Telephone Encounter (Signed)
I contacted the pt and advised Humulin and Novolin are the same. Pt voiced understanding.

## 2015-05-27 ENCOUNTER — Encounter: Payer: Self-pay | Admitting: Gastroenterology

## 2015-06-07 ENCOUNTER — Ambulatory Visit (INDEPENDENT_AMBULATORY_CARE_PROVIDER_SITE_OTHER): Payer: Managed Care, Other (non HMO) | Admitting: Endocrinology

## 2015-06-07 ENCOUNTER — Encounter: Payer: Self-pay | Admitting: Endocrinology

## 2015-06-07 ENCOUNTER — Other Ambulatory Visit: Payer: Self-pay

## 2015-06-07 VITALS — BP 140/86 | HR 84 | Temp 98.1°F | Ht 65.0 in | Wt 252.0 lb

## 2015-06-07 DIAGNOSIS — E109 Type 1 diabetes mellitus without complications: Secondary | ICD-10-CM | POA: Diagnosis not present

## 2015-06-07 LAB — POCT GLYCOSYLATED HEMOGLOBIN (HGB A1C): Hemoglobin A1C: 10.3

## 2015-06-07 MED ORDER — BASAGLAR KWIKPEN 100 UNIT/ML ~~LOC~~ SOPN: 130.0000 [IU] | PEN_INJECTOR | SUBCUTANEOUS | Status: DC

## 2015-06-07 MED ORDER — INSULIN PEN NEEDLE 31G X 6 MM MISC: Status: DC

## 2015-06-07 NOTE — Progress Notes (Signed)
Subjective:    Patient ID: Katherine Costa, female    DOB: 1950-12-01, 65 y.o.   MRN: [id number]  HPI Pt returns for f/u of diabetes mellitus: DM type: 1 Dx'ed: 99991111 Complications: none Therapy: insulin since 1997. GDM: never DKA: once (1997) Severe hypoglycemia: never. Pancreatitis: never.  Other: she declines weight-loss surgery; in 2015, she was changed to qd insulin, as she could not remember multiple daily injections; she works 1st shift at [employer].   Interval history: Pt says she never misses the insulin.  She takes 110 units qam, on all days.  She had steroid injection into the left knee 1 month ago.  cbg's were very high for 1 week thereafter.  Since then, cbg's vary from 70-200.  She says it is lowest in the afternoon.  She says cbg's are the same on days she works vs does not work.   Past Medical History  Diagnosis Date  . DIABETES MELLITUS, TYPE I 08/24/2006  . HYPERCHOLESTEROLEMIA 02/09/2008  . HYPERTENSION 08/24/2006  . HERPES LABIALIS 03/13/2009  . ASYMPTOMATIC POSTMENOPAUSAL STATUS 05/21/2009  . LIVER DISORDER 03/17/2010  . Arthritis   . Hyperlipidemia   . Tubulovillous adenoma of colon     2010   . Family history of colon cancer     Past Surgical History  Procedure Laterality Date  . Cholecystectomy  2001  . Abdominal hysterectomy      Social History   Social History  . Marital Status: Married    Spouse Name: N/A  . Number of Children: 3  . 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 Smoking status: Never Smoker   . Smokeless tobacco: Not on file  . Alcohol Use: No  . Drug Use: No  . Sexual Activity: Not on file   Other Topics Concern  . Not on file   Social History Narrative   PT does not get regular exercise   3 girls    Current Outpatient Prescriptions on File Prior to Visit  Medication Sig Dispense Refill  . aspirin 81 MG tablet Take 81 mg by mouth daily.      .  clotrimazole-betamethasone (LOTRISONE) cream APPLY TOPICALLY THREE TIMES A DAY AS NEEDED FOR RASH. 1 g 0  . colestipol (MICRONIZED COLESTIPOL HCL) 1 G tablet Take 3 tablets (3 g total) by mouth daily. 270 tablet 2  . glucose blood (ONE TOUCH ULTRA TEST) test strip 1 each by Other route 2 (two) times daily. And lancets 2/day 250.01 180 each 3  . Insulin Syringe-Needle U-100 (B-D INS SYR ULTRAFINE .5CC/30G) 30G X 1/2" 0.5 ML MISC 4 USE FOUR TIMES A DAY 360 each 3  . lisinopril (PRINIVIL,ZESTRIL) 5 MG tablet TAKE 1 TABLET DAILY 90 tablet 1  . naproxen (NAPROSYN) 250 MG tablet Take 1 tablet (250 mg total) by mouth every morning. 90 tablet 3  . omeprazole (PRILOSEC) 40 MG capsule Take 1 capsule (40 mg total) by mouth daily. 90 capsule 3  . simvastatin (ZOCOR) 80 MG tablet Take 1 tablet (80 mg total) by mouth daily. 90 tablet 3   No current facility-administered medications on file prior to visit.    Allergies  Allergen Reactions  . Pioglitazone     REACTION: Edema    Family History  Problem Relation Age of Onset  . Cancer Mother     Colon Cancer, Pancreatic Cancer  . Cancer Sister  Colon Cancer  . Diabetes Sister   . Cancer Sister     Breast Cancer    BP 140/86 mmHg  Pulse 84  Temp(Src) 98.1 F (36.7 C) (Oral)  Ht 5\' 5"  (1.651 m)  Wt 252 lb (114.306 kg)  BMI 41.93 kg/m2  SpO2 96%    Review of Systems She denies hypoglycemia.      Objective:   Physical Exam VITAL SIGNS:  See vs page GENERAL: no distress.  Morbid obesity.   Pulses: dorsalis pedis intact bilat.   MSK: no deformity of the feet.  CV: no leg edema.  Skin:  no ulcer on the feet.  normal color and temp on the feet. Neuro: sensation is intact to touch on the feet.  Ext: There is bilateral onychomycosis of the toenails.    A1c=10.3%    Assessment & Plan:  DM: worse.  Based on the pattern of her cbg's, she needs a slower-acting QD insulin.   Noncompliance with cbg recording and f/u ov.     Patient is  advised the following: Patient Instructions  Please come back for a follow-up appointment in 2 weeks.   check your blood sugar twice a day.  vary the time of day when you check, between before the 3 meals, and at bedtime.  also check if you have symptoms of your blood sugar being too high or too low.  please keep a record of the readings and bring it to your next appointment here.  You can write it on any piece of paper.  please call us sooner if your blood sugar goes below 70, or if you have a lot of readings over 200.   On this type of insulin schedule, you should eat meals on a regular schedule.  If a meal is missed or significantly delayed, your blood sugar could go low.   Please change the NPH to basaglar, to 130 units each morning.  Please call us next week, to tell us how the blood sugar is doing.

## 2015-06-07 NOTE — Patient Instructions (Addendum)
Please come back for a follow-up appointment in 2 weeks.   check your blood sugar twice a day.  vary the time of day when you check, between before the 3 meals, and at bedtime.  also check if you have symptoms of your blood sugar being too high or too low.  please keep a record of the readings and bring it to your next appointment here.  You can write it on any piece of paper.  please call us sooner if your blood sugar goes below 70, or if you have a lot of readings over 200.   On this type of insulin schedule, you should eat meals on a regular schedule.  If a meal is missed or significantly delayed, your blood sugar could go low.   Please change the NPH to basaglar, to 130 units each morning.  Please call us next week, to tell us how the blood sugar is doing.

## 2015-06-18 ENCOUNTER — Other Ambulatory Visit: Payer: Self-pay | Admitting: Endocrinology

## 2015-06-19 ENCOUNTER — Telehealth: Payer: Self-pay | Admitting: Endocrinology

## 2015-06-19 NOTE — Telephone Encounter (Signed)
PT called and wants to talk to you about her appt on Friday, requests a call back at work, [phone] ask for [place] in the [employer]

## 2015-06-19 NOTE — Telephone Encounter (Signed)
I contacted the pt. Pt stated she will keep her appointment has scheduled for 06/21/2015.

## 2015-06-19 NOTE — Telephone Encounter (Signed)
Ok We can do the blood tests when you return

## 2015-06-19 NOTE — Telephone Encounter (Signed)
I contacted the pt. Pt wanted to to know if she could come in for blood tests on 06/20/2015 in the afternoon and push her two week follow up out till the next week because she has had personal plans to change and has to leave Friday to go out of town. Please advise, Thanks!

## 2015-06-20 ENCOUNTER — Ambulatory Visit (INDEPENDENT_AMBULATORY_CARE_PROVIDER_SITE_OTHER): Payer: Managed Care, Other (non HMO) | Admitting: Endocrinology

## 2015-06-20 ENCOUNTER — Encounter: Payer: Self-pay | Admitting: Endocrinology

## 2015-06-20 VITALS — BP 144/86 | HR 86 | Temp 97.8°F | Ht 62.0 in | Wt 252.0 lb

## 2015-06-20 DIAGNOSIS — E109 Type 1 diabetes mellitus without complications: Secondary | ICD-10-CM

## 2015-06-20 MED ORDER — INSULIN ASPART 100 UNIT/ML FLEXPEN: 10.0000 [IU] | PEN_INJECTOR | Freq: Every day | SUBCUTANEOUS | Status: DC

## 2015-06-20 MED ORDER — BASAGLAR KWIKPEN 100 UNIT/ML ~~LOC~~ SOPN: 90.0000 [IU] | PEN_INJECTOR | SUBCUTANEOUS | Status: DC

## 2015-06-20 NOTE — Progress Notes (Signed)
Subjective:    Patient ID: Katherine Costa, female    DOB: 05/07/1950, 64 y.o.   MRN: [id number]  HPI Pt returns for f/u of diabetes mellitus: DM type: 1 Dx'ed: 99991111 Complications: none Therapy: insulin since 1997.  GDM: never DKA: once (1997) Severe hypoglycemia: never.  Pancreatitis: never.  Other: she declines weight-loss surgery; in 2015, she was changed to qd insulin, as she could not remember multiple daily injections; she works 1st shift at [employer].   Interval history: She takes 110 units qam.  she brings a record of her cbg's which i have reviewed today.  It varies from 46-200.  It is in general highest at lunch.  Otherwise, There is no trend throughout the day.  She is here to be cleared for uproming orthopedic surgery Past Medical History  Diagnosis Date  . DIABETES MELLITUS, TYPE I 08/24/2006  . HYPERCHOLESTEROLEMIA 02/09/2008  . HYPERTENSION 08/24/2006  . HERPES LABIALIS 03/13/2009  . ASYMPTOMATIC POSTMENOPAUSAL STATUS 05/21/2009  . LIVER DISORDER 03/17/2010  . Arthritis   . Hyperlipidemia   . Tubulovillous adenoma of colon     2010   . Family history of colon cancer     Past Surgical History  Procedure Laterality Date  . Cholecystectomy  2001  . Abdominal hysterectomy      Social History   Social History  . Marital Status: Married    Spouse Name: N/A  . Number of Children: 3  . 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 Smoking status: Never Smoker   . Smokeless tobacco: Not on file  . Alcohol Use: No  . Drug Use: No  . Sexual Activity: Not on file   Other Topics Concern  . Not on file   Social History Narrative   PT does not get regular exercise   3 girls    Current Outpatient Prescriptions on File Prior to Visit  Medication Sig Dispense Refill  . aspirin 81 MG tablet Take 81 mg by mouth daily.      . clotrimazole-betamethasone (LOTRISONE) cream APPLY TOPICALLY THREE  TIMES A DAY AS NEEDED FOR RASH. 1 g 0  . colestipol (MICRONIZED COLESTIPOL HCL) 1 G tablet Take 3 tablets (3 g total) by mouth daily. 270 tablet 2  . glucose blood (ONE TOUCH ULTRA TEST) test strip 1 each by Other route 2 (two) times daily. And lancets 2/day 250.01 180 each 3  . Insulin Pen Needle (CLICKFINE PEN NEEDLES) 31G X 6 MM MISC Use to check blood sugar 1 time per day. 100 each 2  . Insulin Syringe-Needle U-100 (B-D INS SYR ULTRAFINE .5CC/30G) 30G X 1/2" 0.5 ML MISC 4 USE FOUR TIMES A DAY 360 each 3  . lisinopril (PRINIVIL,ZESTRIL) 5 MG tablet TAKE 1 TABLET DAILY 90 tablet 1  . naproxen (NAPROSYN) 250 MG tablet Take 1 tablet (250 mg total) by mouth every morning. 90 tablet 3  . omeprazole (PRILOSEC) 40 MG capsule Take 1 capsule (40 mg total) by mouth daily. 90 capsule 3  . simvastatin (ZOCOR) 80 MG tablet Take 1 tablet (80 mg total) by mouth daily. 90 tablet 3   No current facility-administered medications on file prior to visit.    Allergies  Allergen Reactions  . Pioglitazone     REACTION: Edema    Family History  Problem Relation Age of Onset  . Cancer Mother     Colon Cancer, Pancreatic Cancer  .  Cancer Sister     Colon Cancer  . Diabetes Sister   . Cancer Sister     Breast Cancer    BP 144/86 mmHg  Pulse 86  Temp(Src) 97.8 F (36.6 C) (Oral)  Ht 5\' 2"  (1.575 m)  Wt 252 lb (114.306 kg)  BMI 46.08 kg/m2  SpO2 96%  Review of Systems Denies LOC    Objective:   Physical Exam VITAL SIGNS:  See vs page GENERAL: no distress Pulses: dorsalis pedis intact bilat.   MSK: no deformity of the feet CV: trace bilat leg edema Skin:  no ulcer on the feet.  normal color and temp on the feet. Neuro: sensation is intact to touch on the feet Ext: There is bilateral onychomycosis of the toenails   Fructosamine=298.     Assessment & Plan:  DM: slightly overcontrolled.     Patient is advised the following: Patient Instructions  Please come back for a follow-up  appointment in 3 months.   check your blood sugar twice a day.  vary the time of day when you check, between before the 3 meals, and at bedtime.  also check if you have symptoms of your blood sugar being too high or too low.  please keep a record of the readings and bring it to your next appointment here.  You can write it on any piece of paper.  please call us sooner if your blood sugar goes below 70, or if you have a lot of readings over 200.   On this type of insulin schedule, you should eat meals on a regular schedule.  If a meal is missed or significantly delayed, your blood sugar could go low.   Please reduce the basaglar to 90 units each morning, and:  Add novolog, 10 units daily, with breakfast. blood tests are requested for you today.  We'll let you know about the results. Based on the results, i hope to give the go-ahead for your surgery.   Please call us next week, to tell us how the blood sugar is doing.    addendum: her surgical risk is low and outweighed by the potential benefit of the surgery.  she is therefore medically cleared

## 2015-06-20 NOTE — Patient Instructions (Addendum)
Please come back for a follow-up appointment in 3 months.   check your blood sugar twice a day.  vary the time of day when you check, between before the 3 meals, and at bedtime.  also check if you have symptoms of your blood sugar being too high or too low.  please keep a record of the readings and bring it to your next appointment here.  You can write it on any piece of paper.  please call us sooner if your blood sugar goes below 70, or if you have a lot of readings over 200.   On this type of insulin schedule, you should eat meals on a regular schedule.  If a meal is missed or significantly delayed, your blood sugar could go low.   Please reduce the basaglar to 90 units each morning, and:  Add novolog, 10 units daily, with breakfast. blood tests are requested for you today.  We'll let you know about the results. Based on the results, i hope to give the go-ahead for your surgery.   Please call us next week, to tell us how the blood sugar is doing.

## 2015-06-21 ENCOUNTER — Other Ambulatory Visit: Payer: Self-pay | Admitting: *Deleted

## 2015-06-21 ENCOUNTER — Telehealth: Payer: Self-pay | Admitting: Endocrinology

## 2015-06-21 ENCOUNTER — Ambulatory Visit: Payer: Managed Care, Other (non HMO) | Admitting: Endocrinology

## 2015-06-21 DIAGNOSIS — E109 Type 1 diabetes mellitus without complications: Secondary | ICD-10-CM

## 2015-06-21 LAB — FRUCTOSAMINE: Fructosamine: 298 umol/L — ABNORMAL HIGH (ref 0–285)

## 2015-06-21 MED ORDER — BASAGLAR KWIKPEN 100 UNIT/ML ~~LOC~~ SOPN: 90.0000 [IU] | PEN_INJECTOR | SUBCUTANEOUS | Status: DC

## 2015-06-21 NOTE — Telephone Encounter (Signed)
Called pt and fixed the situation. Pt needs rx to go to her local pharmacy, not [facility]. Sent rx to [facility] for pt.

## 2015-06-21 NOTE — Telephone Encounter (Signed)
PT said that her Rx was sent to the incorrect pharmacy and that she needs to know where it was sent too

## 2015-06-21 NOTE — Telephone Encounter (Signed)
please call patient: basaglar was rejected by ins. Please go back to lantus i'll see you next time.

## 2015-07-10 ENCOUNTER — Telehealth: Payer: Self-pay | Admitting: Endocrinology

## 2015-07-10 NOTE — Telephone Encounter (Signed)
The fax # is [phone]

## 2015-07-10 NOTE — Telephone Encounter (Signed)
i have printed a paper, to get done in [place].

## 2015-07-10 NOTE — Telephone Encounter (Signed)
PT requests a call back from you today, she said you may reach her at home or on her cell

## 2015-07-10 NOTE — Telephone Encounter (Signed)
I contacted the pt and advised of note below. Pt is going to verify which location she would like to have the blood test done. Pt stated she would call us back with the location.

## 2015-07-10 NOTE — Telephone Encounter (Signed)
I contacted the pt. Pt stated the surgeon would not except the fructosamine blood test. Pt stated it had to be an a1c of an 8.1. Pt wanted to know when she can come in and have the blood test rechecked. Please advise, Thanks!

## 2015-07-11 NOTE — Telephone Encounter (Signed)
A1C order faxed.

## 2015-07-12 ENCOUNTER — Ambulatory Visit (AMBULATORY_SURGERY_CENTER): Payer: Self-pay

## 2015-07-12 VITALS — Ht 63.5 in | Wt 247.4 lb

## 2015-07-12 DIAGNOSIS — Z860100 Personal history of colon polyps, unspecified: Secondary | ICD-10-CM

## 2015-07-12 NOTE — Progress Notes (Signed)
No allergies to eggs or soy No diet meds No home oxygen No past problems with anesthesia  Declined emmi 

## 2015-07-15 ENCOUNTER — Other Ambulatory Visit: Payer: Self-pay | Admitting: Endocrinology

## 2015-07-16 ENCOUNTER — Telehealth: Payer: Self-pay | Admitting: Endocrinology

## 2015-07-16 LAB — HGB A1C W/O EAG: Hgb A1c MFr Bld: 8.1 % — ABNORMAL HIGH (ref 4.8–5.6)

## 2015-07-16 NOTE — Telephone Encounter (Signed)
Patient calling for the results of labs

## 2015-07-16 NOTE — Telephone Encounter (Signed)
I contacted the pt and advised we have not received the result for her a1c blood test yet. Pt will be notified once the result come in.

## 2015-07-17 NOTE — Telephone Encounter (Signed)
Patient is calling again for lab results. pb

## 2015-07-17 NOTE — Telephone Encounter (Signed)
Lab result still not received.

## 2015-07-18 ENCOUNTER — Other Ambulatory Visit: Payer: Self-pay

## 2015-07-18 DIAGNOSIS — E109 Type 1 diabetes mellitus without complications: Secondary | ICD-10-CM

## 2015-07-18 MED ORDER — BASAGLAR KWIKPEN 100 UNIT/ML ~~LOC~~ SOPN: 90.0000 [IU] | PEN_INJECTOR | SUBCUTANEOUS | Status: DC

## 2015-07-18 NOTE — Telephone Encounter (Signed)
Patient stated she needed 90 day supply of medication Insulin Glargine (BASAGLAR KWIKPEN) 100 UNIT/ML SOPN. Please advise about labs as soon as possible  [facility] [place], [place] [phone] (Phone) [phone] (Fax)

## 2015-07-18 NOTE — Telephone Encounter (Signed)
I contacted the pharmacy and gave a verbal for a 90 day supply of basaglar insulin. Per dr. Loanne Drilling ok for A1C and EKG to be released to [place] [facility] office.

## 2015-07-19 ENCOUNTER — Telehealth: Payer: Self-pay

## 2015-07-19 NOTE — Telephone Encounter (Signed)
I contacted the pt's husband and advised [facility] and [facility] is requesting a written consent from Dr. Loanne Drilling for the pt's knee surgery. I advised Dr. Loanne Drilling is out of town and will be back on 07/29/2015. Once MD returns paper work will be completed. I asked cover MD Dr. Dwyane Dee if he could sign and the MD stated the form would need to wait for Dr. Loanne Drilling

## 2015-07-22 ENCOUNTER — Encounter: Payer: Self-pay | Admitting: Gastroenterology

## 2015-07-26 ENCOUNTER — Encounter: Payer: Managed Care, Other (non HMO) | Admitting: Gastroenterology

## 2015-07-29 ENCOUNTER — Telehealth: Payer: Self-pay | Admitting: Endocrinology

## 2015-07-29 NOTE — Telephone Encounter (Signed)
Medical clearance has been sent to [employer]. Pt notified.

## 2015-07-29 NOTE — Telephone Encounter (Signed)
It has been sent for the 2nd time

## 2015-07-29 NOTE — Telephone Encounter (Signed)
Patient ask if Dr Loanne Drilling will send the clearance for her knee surgery, please advse

## 2015-08-01 ENCOUNTER — Ambulatory Visit (AMBULATORY_SURGERY_CENTER): Payer: Managed Care, Other (non HMO) | Admitting: Gastroenterology

## 2015-08-01 ENCOUNTER — Encounter: Payer: Self-pay | Admitting: Gastroenterology

## 2015-08-01 VITALS — BP 139/61 | HR 70 | Temp 98.9°F | Resp 14 | Ht 64.0 in | Wt 252.0 lb

## 2015-08-01 DIAGNOSIS — D123 Benign neoplasm of transverse colon: Secondary | ICD-10-CM

## 2015-08-01 DIAGNOSIS — Z800 Family history of malignant neoplasm of digestive organs: Secondary | ICD-10-CM | POA: Diagnosis not present

## 2015-08-01 DIAGNOSIS — D122 Benign neoplasm of ascending colon: Secondary | ICD-10-CM | POA: Diagnosis not present

## 2015-08-01 DIAGNOSIS — D124 Benign neoplasm of descending colon: Secondary | ICD-10-CM

## 2015-08-01 DIAGNOSIS — Z86010 Personal history of colonic polyps: Secondary | ICD-10-CM

## 2015-08-01 MED ORDER — SODIUM CHLORIDE 0.9 % IV SOLN: 500.0000 mL | INTRAVENOUS | Status: DC

## 2015-08-01 NOTE — Patient Instructions (Signed)
YOU HAD AN ENDOSCOPIC PROCEDURE TODAY AT [facility]:   Refer to the procedure report that was given to you for any specific questions about what was found during the examination.  If the procedure report does not answer your questions, please call your gastroenterologist to clarify.  If you requested that your care partner not be given the details of your procedure findings, then the procedure report has been included in a sealed envelope for you to review at your convenience later.  YOU SHOULD EXPECT: Some feelings of bloating in the abdomen. Passage of more gas than usual.  Walking can help get rid of the air that was put into your GI tract during the procedure and reduce the bloating. If you had a lower endoscopy (such as a colonoscopy or flexible sigmoidoscopy) you may notice spotting of blood in your stool or on the toilet paper. If you underwent a bowel prep for your procedure, you may not have a normal bowel movement for a few days.  Please Note:  You might notice some irritation and congestion in your nose or some drainage.  This is from the oxygen used during your procedure.  There is no need for concern and it should clear up in a day or so.  SYMPTOMS TO REPORT IMMEDIATELY:   Following lower endoscopy (colonoscopy or flexible sigmoidoscopy):  Excessive amounts of blood in the stool  Significant tenderness or worsening of abdominal pains  Swelling of the abdomen that is new, acute  Fever of 100F or higher    For urgent or emergent issues, a gastroenterologist can be reached at any hour by calling [phone].   DIET: Your first meal following the procedure should be a small meal and then it is ok to progress to your normal diet. Heavy or fried foods are harder to digest and may make you feel nauseous or bloated.  Likewise, meals heavy in dairy and vegetables can increase bloating.  Drink plenty of fluids but you should avoid alcoholic beverages for 24  hours.  ACTIVITY:  You should plan to take it easy for the rest of today and you should NOT DRIVE or use heavy machinery until tomorrow (because of the sedation medicines used during the test).    FOLLOW UP: Our staff will call the number listed on your records the next business day following your procedure to check on you and address any questions or concerns that you may have regarding the information given to you following your procedure. If we do not reach you, we will leave a message.  However, if you are feeling well and you are not experiencing any problems, there is no need to return our call.  We will assume that you have returned to your regular daily activities without incident.  If any biopsies were taken you will be contacted by phone or by letter within the next 1-3 weeks.  Please call us at [phone] if you have not heard about the biopsies in 3 weeks.    SIGNATURES/CONFIDENTIALITY: You and/or your care partner have signed paperwork which will be entered into your electronic medical record.  These signatures attest to the fact that that the information above on your After Visit Summary has been reviewed and is understood.  Full responsibility of the confidentiality of this discharge information lies with you and/or your care-partner.   Information on polyps,diverticulosis,& hemorrhoids given to you today  HOLD ASPIRIN AND ANTI INFLAMMATORY PRODUCTS FOR 5 DAYS   Await pathology  results

## 2015-08-01 NOTE — Progress Notes (Signed)
Called to room to assist during endoscopic procedure.  Patient ID and intended procedure confirmed with present staff. Received instructions for my participation in the procedure from the performing physician.  

## 2015-08-01 NOTE — Op Note (Signed)
[facility] Patient Name: Katherine Costa Procedure Date: 08/01/2015 7:47 AM MRN: [id number] Endoscopist: Mallie Mussel L. Loletha Carrow , MD Age: 65 Referring MD:  Date of Birth: May 16, 1950 Gender: Female Account #: [id number] Procedure:                Colonoscopy Indications:              Surveillance: Personal history of adenomatous                            polyps on last colonoscopy > 5 years ago, Family                            history of colon cancer in multiple first-degree                            relatives (mother and sister) Medicines:                Monitored Anesthesia Care Procedure:                Pre-Anesthesia Assessment:                           - Prior to the procedure, a History and Physical                            was performed, and patient medications and                            allergies were reviewed. The patient's tolerance of                            previous anesthesia was also reviewed. The risks                            and benefits of the procedure and the sedation                            options and risks were discussed with the patient.                            All questions were answered, and informed consent                            was obtained. Prior Anticoagulants: The patient has                            taken aspirin, last dose was 1 day prior to                            procedure. ASA Grade Assessment: III - A patient                            with severe systemic disease. After reviewing the  risks and benefits, the patient was deemed in                            satisfactory condition to undergo the procedure.                           After obtaining informed consent, the colonoscope                            was passed under direct vision. Throughout the                            procedure, the patient's blood pressure, pulse, and                            oxygen saturations were monitored  continuously. The                            Model CF-HQ190L [id number]) scope was introduced                            through the anus and advanced to the the cecum,                            identified by appendiceal orifice and ileocecal                            valve. The colonoscopy was performed without                            difficulty. The patient tolerated the procedure                            well. The quality of the bowel preparation was                            good. The ileocecal valve, appendiceal orifice, and                            rectum were photographed. The bowel preparation                            used was Miralax. Scope In: 8:01:37 AM Scope Out: 8:20:46 AM Scope Withdrawal Time: 0 hours 15 minutes 39 seconds  Total Procedure Duration: 0 hours 19 minutes 9 seconds  Findings:                 The perianal and digital rectal examinations were                            normal.                           Two sessile polyps were found in the proximal  descending colon and proximal ascending colon. The                            polyps were 4 mm in size. These polyps were removed                            with a cold snare. Resection and retrieval were                            complete.                           A 4 mm polyp was found in the proximal transverse                            colon. The polyp was flat. The polyp was removed                            with a piecemeal technique using a cold biopsy                            forceps. Resection and retrieval were complete.                           Multiple diverticula were found in the transverse                            colon and left colon.                           Internal hemorrhoids were found during                            retroflexion. The hemorrhoids were Grade I                            (internal hemorrhoids that do not prolapse).                            The exam was otherwise without abnormality. Complications:            No immediate complications. Estimated Blood Loss:     Estimated blood loss: none. Impression:               - Two 4 mm polyps in the proximal descending colon                            and in the proximal ascending colon, removed with a                            cold snare. Resected and retrieved.                           - One 4 mm polyp in the proximal transverse colon,  removed piecemeal using a cold biopsy forceps.                            Resected and retrieved.                           - Diverticulosis in the transverse colon and in the                            left colon.                           - Internal hemorrhoids.                           - The examination was otherwise normal. Recommendation:           - Patient has a contact number available for                            emergencies. The signs and symptoms of potential                            delayed complications were discussed with the                            patient. Return to normal activities tomorrow.                            Written discharge instructions were provided to the                            patient.                           - Resume previous diet.                           - Continue present medications.                           - No aspirin, ibuprofen, naproxen, or other                            non-steroidal anti-inflammatory drugs for 5 days                            after polyp removal.                           - Await pathology results.                           - Repeat colonoscopy is recommended for                            surveillance. The colonoscopy date will be  determined after pathology results from today's                            exam become available for review. Henry L. Loletha Carrow, MD 08/01/2015 8:27:20 AM This report has been signed  electronically.

## 2015-08-01 NOTE — Progress Notes (Signed)
Patient awakening,vss,report to rn 

## 2015-08-02 ENCOUNTER — Telehealth: Payer: Self-pay

## 2015-08-02 NOTE — Telephone Encounter (Signed)
Left message on answering machine. 

## 2015-08-07 ENCOUNTER — Encounter: Payer: Self-pay | Admitting: Gastroenterology

## 2015-08-14 DIAGNOSIS — Z0279 Encounter for issue of other medical certificate: Secondary | ICD-10-CM

## 2015-08-15 ENCOUNTER — Telehealth: Payer: Self-pay

## 2015-08-15 NOTE — Telephone Encounter (Signed)
I contacted the pt and advised Dr. Loanne Drilling has completed her DMV paper work. Copies of the paper work placed upfront for the pt to come by and pick up.

## 2015-08-27 ENCOUNTER — Ambulatory Visit: Payer: Self-pay | Admitting: Physician Assistant

## 2015-08-27 NOTE — H&P (Signed)
TOTAL KNEE ADMISSION H&P  Patient is being admitted for left total knee arthroplasty.  Subjective:  Chief Complaint:left knee pain.  HPI: Katherine Costa, 65 y.o. female, has a history of pain and functional disability in the left knee due to arthritis and has failed non-surgical conservative treatments for greater than 12 weeks to includeNSAID's and/or analgesics, corticosteriod injections and activity modification.  Onset of symptoms was gradual, starting 10 years ago with gradually worsening course since that time. The patient noted no past surgery on the left knee(s).  Patient currently rates pain in the left knee(s) at 10 out of 10 with activity. Patient has night pain, worsening of pain with activity and weight bearing, pain that interferes with activities of daily living, pain with passive range of motion, crepitus and joint swelling.  Patient has evidence of periarticular osteophytes and joint space narrowing by imaging studies.There is no active infection.  Patient Active Problem List   Diagnosis Date Noted  . Wellness examination 06/01/2014  . Pain in joint, shoulder region 06/01/2014  . Morbid obesity ([place]) 04/21/2013  . Encounter for long-term (current) use of other medications 09/02/2011  . LIVER DISORDER 03/17/2010  . ASYMPTOMATIC POSTMENOPAUSAL STATUS 05/21/2009  . HERPES LABIALIS 03/13/2009  . COUGH 03/13/2009  . HYPERCHOLESTEROLEMIA 02/09/2008  . Type 1 diabetes mellitus ([place]) 08/24/2006  . Essential hypertension 08/24/2006   Past Medical History:  Diagnosis Date  . Arthritis   . ASYMPTOMATIC POSTMENOPAUSAL STATUS 05/21/2009  . DIABETES MELLITUS, TYPE I 08/24/2006  . Family history of colon cancer   . HERPES LABIALIS 03/13/2009  . HYPERCHOLESTEROLEMIA 02/09/2008  . Hyperlipidemia   . HYPERTENSION 08/24/2006  . LIVER DISORDER 03/17/2010  . Tubulovillous adenoma of colon    2010     Past Surgical History:  Procedure Laterality Date  . ABDOMINAL HYSTERECTOMY    .  ACHILLES TENDON REPAIR  2010   right heel  . CHOLECYSTECTOMY  2001     (Not in a hospital admission) Allergies  Allergen Reactions  . Pioglitazone     REACTION: Edema    Social History  Substance Use Topics  . Smoking status: Never Smoker  . Smokeless tobacco: Never Used  . Alcohol use No    Family History  Problem Relation Age of Onset  . Cancer Mother     Colon Cancer, Pancreatic Cancer  . Colon cancer Mother   . Cancer Sister     Colon Cancer  . Diabetes Sister   . Colon cancer Sister   . Cancer Sister     Breast Cancer     Review of Systems  Musculoskeletal: Positive for joint pain.  All other systems reviewed and are negative.   Objective:  Physical Exam  Constitutional: She is oriented to person, place, and time. She appears well-developed and well-nourished. No distress.  HENT:  Head: Normocephalic and atraumatic.  Nose: Nose normal.  Eyes: Conjunctivae and EOM are normal. Pupils are equal, round, and reactive to light.  Neck: Normal range of motion. Neck supple.  Cardiovascular: Normal rate, regular rhythm, normal heart sounds and intact distal pulses.   Respiratory: Effort normal and breath sounds normal. No respiratory distress. She has no wheezes. She has no rales.  GI: Bowel sounds are normal. She exhibits no distension. There is no tenderness.  Musculoskeletal:       Left knee: She exhibits swelling and bony tenderness. Tenderness found.  Lymphadenopathy:    She has no cervical adenopathy.  Neurological: She is alert and oriented to  person, place, and time. No cranial nerve deficit.  Skin: Skin is warm and dry. No rash noted. No erythema.  Psychiatric: She has a normal mood and affect. Her behavior is normal.    Vital signs in last 24 hours: @VSRANGES @  Labs:   Estimated body mass index is 43.26 kg/m as calculated from the following:   Height as of 08/01/15: 5\' 4"  (1.626 m).   Weight as of 08/01/15: 114.3 kg (252 lb).   Imaging  Review Plain radiographs demonstrate moderate degenerative joint disease of the left knee(s). The overall alignment ismild varus. The bone quality appears to be good for age and reported activity level.  Assessment/Plan:  End stage arthritis, left knee   The patient history, physical examination, clinical judgment of the provider and imaging studies are consistent with end stage degenerative joint disease of the left knee(s) and total knee arthroplasty is deemed medically necessary. The treatment options including medical management, injection therapy arthroscopy and arthroplasty were discussed at length. The risks and benefits of total knee arthroplasty were presented and reviewed. The risks due to aseptic loosening, infection, stiffness, patella tracking problems, thromboembolic complications and other imponderables were discussed. The patient acknowledged the explanation, agreed to proceed with the plan and consent was signed. Patient is being admitted for inpatient treatment for surgery, pain control, PT, OT, prophylactic antibiotics, VTE prophylaxis, progressive ambulation and ADL's and discharge planning. The patient is planning to be discharged home with home health services

## 2015-08-30 ENCOUNTER — Encounter (HOSPITAL_COMMUNITY): Payer: Self-pay

## 2015-08-30 ENCOUNTER — Other Ambulatory Visit (HOSPITAL_COMMUNITY): Payer: Self-pay | Admitting: *Deleted

## 2015-08-30 NOTE — Pre-Procedure Instructions (Signed)
Katherine Costa  08/30/2015      [facility], [place] [id number] [place] [id number] Phone: [phone] Fax: [phone] [address], [place] [address] Phone: [phone] Fax: [phone]  [facility] [place] [place] to [address] [place] [address] Phone: [phone] Fax: [phone]  [id number] [place], [place] [address] [place] [address] Phone: [phone] Fax: [phone]    Your procedure is scheduled on Friday, September 13, 2015 at 7:30 AM.  Report to [facility] Entrance "A" Admitting Office at 5:30 AM.  Call this number if you have problems the morning of surgery: [phone]  Any questions prior to day of surgery, please call [phone] between 8 & 4 PM.   Remember:  Do not eat food or drink liquids after midnight Thursday, 09/12/15.  Take these medicines the morning of surgery with A SIP OF WATER: Omeprazole (Prilosec)  Stop Aspirin and NSAIDS (Naproxen, Aleve, Ibuprofen, etc.) 7 days prior to surgery.    How to Manage Your Diabetes Before Surgery   Why is it important to control my blood sugar before and after surgery?   Improving blood sugar levels before and after surgery helps healing and can limit problems.  A way of improving blood sugar control is eating a healthy diet by:  - Eating less sugar and carbohydrates  - Increasing activity/exercise  - Talk with your doctor about reaching your blood sugar goals  High blood sugars (greater than 180 mg/dL) can raise your risk of infections and slow down your recovery so you will need to focus on controlling your diabetes during the weeks before surgery.  Make sure that the doctor who takes care of your diabetes  knows about your planned surgery including the date and location.  How do I manage my blood sugars before surgery?   Check your blood sugar at least 4 times a day, 2 days before surgery to make sure that they are not too high or low.  Check your blood sugar the morning of your surgery when you wake up and every 2 hours until you get to the Short-Stay unit.  Treat a low blood sugar (less than 70 mg/dL) with 1/2 cup of clear juice (cranberry or apple), 4 glucose tablets, OR glucose gel.  Recheck blood sugar in 15 minutes after treatment (to make sure it is greater than 70 mg/dL).  If blood sugar is not greater than 70 mg/dL on re-check, call [phone] for further instructions.   Report your blood sugar to the Short-Stay nurse when you get to Short-Stay.  References:  [facility], 2007 "How to Manage your Diabetes Before and After Surgery".  What do I do about my diabetes medications?   THE MORNING OF SURGERY, take 45 units of Basaglar Insulin. Do not use your Novolog Flexpen the morning of surgery.   Do not wear jewelry, make-up or nail polish.  Do not wear lotions, powders, or perfumes.  You may wear deodorant.  Do not shave 48 hours prior to surgery.    Do not bring valuables to the hospital.  [facility] is not responsible for any belongings or valuables.  Contacts, dentures or bridgework  may not be worn into surgery.  Leave your suitcase in the car.  After surgery it may be brought to your room.  For patients admitted to the hospital, discharge time will be determined by your treatment team.  Special instructions:  See "Preparing for Surgery" Instruction sheet.   Please read over the following fact sheets that you were given. Pain Booklet, Coughing and Deep Breathing, MRSA Information and Surgical Site Infection Prevention

## 2015-09-02 ENCOUNTER — Ambulatory Visit (HOSPITAL_COMMUNITY)
Admission: RE | Admit: 2015-09-02 | Discharge: 2015-09-02 | Disposition: A | Discharge status: Home / Self Care | Payer: Managed Care, Other (non HMO) | Source: Ambulatory Visit | Attending: Physician Assistant | Admitting: Physician Assistant

## 2015-09-02 ENCOUNTER — Encounter (HOSPITAL_COMMUNITY)
Admission: RE | Admit: 2015-09-02 | Discharge: 2015-09-02 | Disposition: A | Discharge status: Home / Self Care | Payer: Managed Care, Other (non HMO) | Source: Ambulatory Visit | Attending: Orthopedic Surgery | Admitting: Orthopedic Surgery

## 2015-09-02 ENCOUNTER — Encounter (HOSPITAL_COMMUNITY): Payer: Self-pay

## 2015-09-02 DIAGNOSIS — Z01812 Encounter for preprocedural laboratory examination: Secondary | ICD-10-CM | POA: Diagnosis not present

## 2015-09-02 DIAGNOSIS — M1712 Unilateral primary osteoarthritis, left knee: Secondary | ICD-10-CM | POA: Insufficient documentation

## 2015-09-02 DIAGNOSIS — R918 Other nonspecific abnormal finding of lung field: Secondary | ICD-10-CM | POA: Insufficient documentation

## 2015-09-02 HISTORY — DX: Essential (primary) hypertension: I10

## 2015-09-02 LAB — CBC WITH DIFFERENTIAL/PLATELET
BASOS PCT: 1 %
Basophils Absolute: 0 10*3/uL (ref 0.0–0.1)
EOS ABS: 0.3 10*3/uL (ref 0.0–0.7)
EOS PCT: 4 %
HCT: 43.9 % (ref 36.0–46.0)
Hemoglobin: 13.9 g/dL (ref 12.0–15.0)
LYMPHS ABS: 1.1 10*3/uL (ref 0.7–4.0)
Lymphocytes Relative: 18 %
MCH: 28.8 pg (ref 26.0–34.0)
MCHC: 31.7 g/dL (ref 30.0–36.0)
MCV: 91.1 fL (ref 78.0–100.0)
MONOS PCT: 9 %
Monocytes Absolute: 0.6 10*3/uL (ref 0.1–1.0)
NEUTROS PCT: 68 %
Neutro Abs: 4.2 10*3/uL (ref 1.7–7.7)
PLATELETS: 290 10*3/uL (ref 150–400)
RBC: 4.82 MIL/uL (ref 3.87–5.11)
RDW: 13.7 % (ref 11.5–15.5)
WBC: 6.2 10*3/uL (ref 4.0–10.5)

## 2015-09-02 LAB — URINALYSIS, ROUTINE W REFLEX MICROSCOPIC
Bilirubin Urine: NEGATIVE
Glucose, UA: >1000 mg/dL — AB
HGB URINE DIPSTICK: NEGATIVE
Ketones, ur: 15 mg/dL — AB
Leukocytes, UA: NEGATIVE
Nitrite: NEGATIVE
Protein, ur: NEGATIVE mg/dL
SPECIFIC GRAVITY, URINE: 1.029 (ref 1.005–1.030)
pH: 5.5 (ref 5.0–8.0)

## 2015-09-02 LAB — URINE MICROSCOPIC-ADD ON

## 2015-09-02 LAB — SURGICAL PCR SCREEN
MRSA, PCR: POSITIVE — AB
STAPHYLOCOCCUS AUREUS: POSITIVE — AB

## 2015-09-02 LAB — TYPE AND SCREEN
ABO/RH(D): O POS
Antibody Screen: NEGATIVE

## 2015-09-02 LAB — COMPREHENSIVE METABOLIC PANEL
ALBUMIN: 3.7 g/dL (ref 3.5–5.0)
ALT: 24 U/L (ref 14–54)
ANION GAP: 7 (ref 5–15)
AST: 27 U/L (ref 15–41)
Alkaline Phosphatase: 102 U/L (ref 38–126)
BUN: 17 mg/dL (ref 6–20)
CALCIUM: 9.1 mg/dL (ref 8.9–10.3)
CHLORIDE: 106 mmol/L (ref 101–111)
CO2: 22 mmol/L (ref 22–32)
Creatinine, Ser: 0.9 mg/dL (ref 0.44–1.00)
GFR calc non Af Amer: >60 mL/min (ref >60)
GLUCOSE: 214 mg/dL — AB (ref 65–99)
Potassium: 4.3 mmol/L (ref 3.5–5.1)
SODIUM: 135 mmol/L (ref 135–145)
TOTAL PROTEIN: 6.3 g/dL — AB (ref 6.5–8.1)
Total Bilirubin: 0.7 mg/dL (ref 0.3–1.2)

## 2015-09-02 LAB — PROTIME-INR
INR: 0.91
PROTHROMBIN TIME: 12.2 s (ref 11.4–15.2)

## 2015-09-02 LAB — APTT: aPTT: 26 seconds (ref 24–36)

## 2015-09-02 LAB — ABO/RH: ABO/RH(D): O POS

## 2015-09-02 LAB — GLUCOSE, CAPILLARY: Glucose-Capillary: 252 mg/dL — ABNORMAL HIGH (ref 65–99)

## 2015-09-02 NOTE — Pre-Procedure Instructions (Signed)
Katherine Costa  09/02/2015      [facility] [place], [place] [id number] [place] [id number] Phone: [phone] Fax: [phone] [address], [place] [address] Phone: [phone] Fax: [phone]  [facility] [place], [place] to [address] [place] [address] Phone: [phone] Fax: [phone]  [id number] [place], [place] [address] [place] [address] Phone: [phone] Fax: [phone]    Your procedure is scheduled on 09/13/15.  Report to [facility] Admitting at 530 A.M.  Call this number if you have problems the morning of surgery:  [phone]   Remember:  Do not eat food or drink liquids after midnight.  Take these medicines the morning of surgery with A SIP OF WATER --prilosec   Do not wear jewelry, make-up or nail polish.  Do not wear lotions, powders, or perfumes.  You may wear deoderant.  Do not shave 48 hours prior to surgery.  Men may shave face and neck.  Do not bring valuables to the hospital.  [facility] is not responsible for any belongings or valuables.  Contacts, dentures or bridgework may not be worn into surgery.  Leave your suitcase in the car.  After surgery it may be brought to your room.  For patients admitted to the hospital, discharge time will be determined by your treatment team.  Patients discharged the day of surgery will not be allowed to drive home.   Name and phone number of your driver:    Special instructions:  Do not take any aspirin,anti-inflammatories,vitamins,or herbal supplements 5-7 days prior to surgery.  Please read over the following fact sheets that you were given. MRSA Information    How to Manage Your Diabetes Before and After  Surgery  Why is it important to control my blood sugar before and after surgery? . Improving blood sugar levels before and after surgery helps healing and can limit problems. . A way of improving blood sugar control is eating a healthy diet by: o  Eating less sugar and carbohydrates o  Increasing activity/exercise o  Talking with your doctor about reaching your blood sugar goals . High blood sugars (greater than 180 mg/dL) can raise your risk of infections and slow your recovery, so you will need to focus on controlling your diabetes during the weeks before surgery. . Make sure that the doctor who takes care of your diabetes knows about your planned surgery including the date and location.  How do I manage my blood sugar before surgery? . Check your blood sugar at least 4 times a day, starting 2 days before surgery, to make sure that the level is not too high or low. o Check your blood sugar the morning of your surgery when you wake up and every 2 hours until you get to the Short Stay unit. . If your blood sugar is less than 70 mg/dL, you will need to treat for low blood sugar: o Do not take insulin. o Treat a low blood sugar (less than 70 mg/dL) with  cup of clear juice (cranberry or apple), 4 glucose tablets, OR glucose gel. o Recheck blood sugar in 15 minutes after treatment (to make sure it is greater  than 70 mg/dL). If your blood sugar is not greater than 70 mg/dL on recheck, call [phone] for further instructions. . Report your blood sugar to the short stay nurse when you get to Short Stay.  . If you are admitted to the hospital after surgery: o Your blood sugar will be checked by the staff and you will probably be given insulin after surgery (instead of oral diabetes medicines) to make sure you have good blood sugar levels. o The goal for blood sugar control after surgery is 80-180 mg/dL.              WHAT DO I DO ABOUT MY DIABETES MEDICATION?   Marland Kitchen Do not take oral  diabetes medicines (pills) the morning of surgery.  . THE NIGHT BEFORE SURGERY, take ____50%_______ units of ___glargine________insulin.       Marland Kitchen HE MORNING OF SURGERY, take _____50%________ units of ____glargine______insulin.  . The day of surgery, do not take other diabetes injectables, including Byetta (exenatide), Bydureon (exenatide ER), Victoza (liraglutide), or Trulicity (dulaglutide).  . If your CBG is greater than 220 mg/dL, you may take  of your sliding scale (correction) dose of insulin.  Other Instructions:          Patient Signature:  Date:   Nurse Signature:  Date:   Reviewed and Endorsed by [facility] Patient Education Committee, August 2015

## 2015-09-03 LAB — URINE CULTURE: Culture: NO GROWTH

## 2015-09-12 MED ORDER — TRANEXAMIC ACID 1000 MG/10ML IV SOLN
1000.0000 mg | INTRAVENOUS | Status: AC
  Administered 2015-09-13: 1000 mg via INTRAVENOUS
  Filled 2015-09-12: qty 10

## 2015-09-12 NOTE — Anesthesia Preprocedure Evaluation (Addendum)
Anesthesia Evaluation  Patient identified by MRN, date of birth, ID band Patient awake    Reviewed: Allergy & Precautions, NPO status , Patient's Chart, lab work & pertinent test results  History of Anesthesia Complications Negative for: history of anesthetic complications  Airway Mallampati: II  TM Distance: >3 FB Neck ROM: Full    Dental  (+) Dental Advisory Given, Caps, Missing   Pulmonary neg pulmonary ROS,    breath sounds clear to auscultation       Cardiovascular hypertension, Pt. on medications (-) angina Rhythm:Regular Rate:Normal     Neuro/Psych negative neurological ROS     GI/Hepatic Neg liver ROS, GERD  Medicated and Controlled,  Endo/Other  diabetes (glu 220), Insulin DependentMorbid obesity  Renal/GU negative Renal ROS     Musculoskeletal   Abdominal (+) + obese,   Peds  Hematology negative hematology ROS (+)   Anesthesia Other Findings   Reproductive/Obstetrics                            Anesthesia Physical Anesthesia Plan  ASA: III  Anesthesia Plan: General   Post-op Pain Management: GA combined w/ Regional for post-op pain   Induction: Intravenous  Airway Management Planned: Oral ETT  Additional Equipment:   Intra-op Plan:   Post-operative Plan: Extubation in OR  Informed Consent: I have reviewed the patients History and Physical, chart, labs and discussed the procedure including the risks, benefits and alternatives for the proposed anesthesia with the patient or authorized representative who has indicated his/her understanding and acceptance.   Dental advisory given  Plan Discussed with: CRNA and Surgeon  Anesthesia Plan Comments: (Pt refuses SAB, plan routine monitors, GETA with adductor canal block for post op analgesia)        Anesthesia Quick Evaluation

## 2015-09-13 ENCOUNTER — Ambulatory Visit: Payer: Managed Care, Other (non HMO) | Admitting: Endocrinology

## 2015-09-13 ENCOUNTER — Inpatient Hospital Stay (HOSPITAL_COMMUNITY)
Admission: RE | Admit: 2015-09-13 | Discharge: 2015-09-14 | DRG: 470 | Disposition: A | Discharge status: Home / Self Care | Payer: Managed Care, Other (non HMO) | Source: Ambulatory Visit | Attending: Orthopedic Surgery | Admitting: Orthopedic Surgery

## 2015-09-13 ENCOUNTER — Encounter (HOSPITAL_COMMUNITY): Payer: Self-pay | Admitting: *Deleted

## 2015-09-13 ENCOUNTER — Encounter (HOSPITAL_COMMUNITY)
Admission: RE | Disposition: A | Discharge status: Home / Self Care | Payer: Self-pay | Source: Ambulatory Visit | Attending: Orthopedic Surgery

## 2015-09-13 ENCOUNTER — Inpatient Hospital Stay (HOSPITAL_COMMUNITY): Payer: Managed Care, Other (non HMO) | Admitting: Anesthesiology

## 2015-09-13 DIAGNOSIS — M1712 Unilateral primary osteoarthritis, left knee: Secondary | ICD-10-CM | POA: Diagnosis present

## 2015-09-13 DIAGNOSIS — E109 Type 1 diabetes mellitus without complications: Secondary | ICD-10-CM | POA: Diagnosis present

## 2015-09-13 DIAGNOSIS — E785 Hyperlipidemia, unspecified: Secondary | ICD-10-CM | POA: Diagnosis present

## 2015-09-13 DIAGNOSIS — Z888 Allergy status to other drugs, medicaments and biological substances status: Secondary | ICD-10-CM | POA: Diagnosis not present

## 2015-09-13 DIAGNOSIS — K219 Gastro-esophageal reflux disease without esophagitis: Secondary | ICD-10-CM | POA: Diagnosis present

## 2015-09-13 DIAGNOSIS — E7800 Pure hypercholesterolemia, unspecified: Secondary | ICD-10-CM | POA: Diagnosis present

## 2015-09-13 DIAGNOSIS — Z6841 Body Mass Index (BMI) 40.0 and over, adult: Secondary | ICD-10-CM

## 2015-09-13 DIAGNOSIS — Z90710 Acquired absence of both cervix and uterus: Secondary | ICD-10-CM

## 2015-09-13 DIAGNOSIS — I10 Essential (primary) hypertension: Secondary | ICD-10-CM | POA: Diagnosis present

## 2015-09-13 HISTORY — PX: TOTAL KNEE ARTHROPLASTY: SHX125

## 2015-09-13 LAB — GLUCOSE, CAPILLARY
GLUCOSE-CAPILLARY: 214 mg/dL — AB (ref 65–99)
GLUCOSE-CAPILLARY: 230 mg/dL — AB (ref 65–99)
GLUCOSE-CAPILLARY: 215 mg/dL — AB (ref 65–99)
Glucose-Capillary: 220 mg/dL — ABNORMAL HIGH (ref 65–99)
Glucose-Capillary: 221 mg/dL — ABNORMAL HIGH (ref 65–99)

## 2015-09-13 SURGERY — ARTHROPLASTY, KNEE, TOTAL
Anesthesia: Regional | Laterality: Left

## 2015-09-13 MED ORDER — PROPOFOL 10 MG/ML IV BOLUS
INTRAVENOUS | Status: DC | PRN
  Administered 2015-09-13: 150 mg via INTRAVENOUS

## 2015-09-13 MED ORDER — ACETAMINOPHEN 325 MG PO TABS
650.0000 mg | ORAL_TABLET | Freq: Four times a day (QID) | ORAL | Status: DC | PRN
Start: 2015-09-13 — End: 2015-09-14

## 2015-09-13 MED ORDER — INSULIN ASPART 100 UNIT/ML ~~LOC~~ SOLN
0.0000 [IU] | Freq: Three times a day (TID) | SUBCUTANEOUS | Status: DC
  Administered 2015-09-13: 4 [IU] via SUBCUTANEOUS
  Administered 2015-09-13: 7 [IU] via SUBCUTANEOUS
  Administered 2015-09-14: 15 [IU] via SUBCUTANEOUS
  Administered 2015-09-14: 7 [IU] via SUBCUTANEOUS

## 2015-09-13 MED ORDER — SORBITOL 70 % SOLN: 30.0000 mL | Freq: Every day | Status: DC | PRN

## 2015-09-13 MED ORDER — BUPIVACAINE LIPOSOME 1.3 % IJ SUSP
20.0000 mL | INTRAMUSCULAR | Status: DC
  Filled 2015-09-13: qty 20

## 2015-09-13 MED ORDER — TRANEXAMIC ACID 1000 MG/10ML IV SOLN
1000.0000 mg | Freq: Once | INTRAVENOUS | Status: AC
  Administered 2015-09-13: 1000 mg via INTRAVENOUS
  Filled 2015-09-13: qty 10

## 2015-09-13 MED ORDER — SODIUM CHLORIDE 0.9% FLUSH
INTRAVENOUS | Status: DC | PRN
Start: 2015-09-13 — End: 2015-09-13
  Administered 2015-09-13: 50 mL via INTRAVENOUS

## 2015-09-13 MED ORDER — METOCLOPRAMIDE HCL 5 MG/ML IJ SOLN
5.0000 mg | Freq: Three times a day (TID) | INTRAMUSCULAR | Status: DC | PRN
  Administered 2015-09-13 – 2015-09-14 (×2): 10 mg via INTRAVENOUS
  Filled 2015-09-13 (×2): qty 2

## 2015-09-13 MED ORDER — HYDROMORPHONE HCL 1 MG/ML IJ SOLN
INTRAMUSCULAR | Status: AC
  Filled 2015-09-13: qty 1

## 2015-09-13 MED ORDER — BUPIVACAINE-EPINEPHRINE 0.5% -1:200000 IJ SOLN
INTRAMUSCULAR | Status: DC | PRN
  Administered 2015-09-13: 50 mL

## 2015-09-13 MED ORDER — DIPHENHYDRAMINE HCL 12.5 MG/5ML PO ELIX
12.5000 mg | ORAL_SOLUTION | ORAL | Status: DC | PRN
  Administered 2015-09-14: 12.5 mg via ORAL
  Filled 2015-09-13: qty 10

## 2015-09-13 MED ORDER — APIXABAN 2.5 MG PO TABS
2.5000 mg | ORAL_TABLET | Freq: Two times a day (BID) | ORAL | Status: DC
  Administered 2015-09-14: 2.5 mg via ORAL
  Filled 2015-09-13: qty 1

## 2015-09-13 MED ORDER — GLYCOPYRROLATE 0.2 MG/ML IJ SOLN
INTRAMUSCULAR | Status: DC | PRN
  Administered 2015-09-13: 0.6 mg via INTRAVENOUS

## 2015-09-13 MED ORDER — ONDANSETRON HCL 4 MG PO TABS: 4.0000 mg | ORAL_TABLET | Freq: Four times a day (QID) | ORAL | Status: DC | PRN

## 2015-09-13 MED ORDER — ARTIFICIAL TEARS OP OINT
TOPICAL_OINTMENT | OPHTHALMIC | Status: DC | PRN
  Administered 2015-09-13: 1 via OPHTHALMIC

## 2015-09-13 MED ORDER — LACTATED RINGERS IV SOLN
INTRAVENOUS | Status: DC | PRN
  Administered 2015-09-13 (×2): via INTRAVENOUS

## 2015-09-13 MED ORDER — CEFAZOLIN SODIUM-DEXTROSE 2-4 GM/100ML-% IV SOLN
2.0000 g | INTRAVENOUS | Status: AC
  Administered 2015-09-13: 2 g via INTRAVENOUS
  Filled 2015-09-13: qty 100

## 2015-09-13 MED ORDER — BUPIVACAINE-EPINEPHRINE (PF) 0.5% -1:200000 IJ SOLN
INTRAMUSCULAR | Status: AC
  Filled 2015-09-13: qty 60

## 2015-09-13 MED ORDER — ONDANSETRON HCL 4 MG/2ML IJ SOLN
4.0000 mg | Freq: Four times a day (QID) | INTRAMUSCULAR | Status: DC | PRN
  Administered 2015-09-13 (×2): 4 mg via INTRAVENOUS
  Filled 2015-09-13 (×2): qty 2

## 2015-09-13 MED ORDER — ONDANSETRON HCL 4 MG/2ML IJ SOLN
INTRAMUSCULAR | Status: DC | PRN
  Administered 2015-09-13: 4 mg via INTRAVENOUS

## 2015-09-13 MED ORDER — MEPERIDINE HCL 25 MG/ML IJ SOLN: 6.2500 mg | INTRAMUSCULAR | Status: DC | PRN

## 2015-09-13 MED ORDER — DOCUSATE SODIUM 100 MG PO CAPS
100.0000 mg | ORAL_CAPSULE | Freq: Two times a day (BID) | ORAL | Status: DC
  Administered 2015-09-13 – 2015-09-14 (×3): 100 mg via ORAL
  Filled 2015-09-13 (×3): qty 1

## 2015-09-13 MED ORDER — BUPIVACAINE LIPOSOME 1.3 % IJ SUSP
INTRAMUSCULAR | Status: DC | PRN
Start: 2015-09-13 — End: 2015-09-13
  Administered 2015-09-13: 20 mL

## 2015-09-13 MED ORDER — NEOSTIGMINE METHYLSULFATE 10 MG/10ML IV SOLN
INTRAVENOUS | Status: DC | PRN
  Administered 2015-09-13: 4 mg via INTRAMUSCULAR

## 2015-09-13 MED ORDER — OXYCODONE HCL 5 MG PO TABS
5.0000 mg | ORAL_TABLET | ORAL | Status: DC | PRN
  Administered 2015-09-13 (×3): 5 mg via ORAL
  Administered 2015-09-13 – 2015-09-14 (×6): 10 mg via ORAL
  Filled 2015-09-13: qty 2
  Filled 2015-09-13: qty 1
  Filled 2015-09-13 (×2): qty 2
  Filled 2015-09-13 (×2): qty 1
  Filled 2015-09-13 (×3): qty 2

## 2015-09-13 MED ORDER — PROPOFOL 10 MG/ML IV BOLUS
INTRAVENOUS | Status: AC
  Filled 2015-09-13: qty 40

## 2015-09-13 MED ORDER — ACETAMINOPHEN 650 MG RE SUPP: 650.0000 mg | Freq: Four times a day (QID) | RECTAL | Status: DC | PRN

## 2015-09-13 MED ORDER — LISINOPRIL 5 MG PO TABS
5.0000 mg | ORAL_TABLET | Freq: Every day | ORAL | Status: DC
  Administered 2015-09-14: 5 mg via ORAL
  Filled 2015-09-13: qty 1

## 2015-09-13 MED ORDER — VANCOMYCIN HCL IN DEXTROSE 1-5 GM/200ML-% IV SOLN
1000.0000 mg | Freq: Two times a day (BID) | INTRAVENOUS | Status: AC
  Administered 2015-09-13: 1000 mg via INTRAVENOUS
  Filled 2015-09-13: qty 200

## 2015-09-13 MED ORDER — INSULIN ASPART 100 UNIT/ML ~~LOC~~ SOLN
0.0000 [IU] | Freq: Every day | SUBCUTANEOUS | Status: DC
Start: 2015-09-13 — End: 2015-09-14
  Administered 2015-09-13: 2 [IU] via SUBCUTANEOUS

## 2015-09-13 MED ORDER — HYDROMORPHONE HCL 1 MG/ML IJ SOLN
0.2500 mg | INTRAMUSCULAR | Status: DC | PRN
  Administered 2015-09-13 (×4): 0.5 mg via INTRAVENOUS

## 2015-09-13 MED ORDER — METHOCARBAMOL 500 MG PO TABS
500.0000 mg | ORAL_TABLET | Freq: Four times a day (QID) | ORAL | Status: DC | PRN
  Administered 2015-09-13 – 2015-09-14 (×3): 500 mg via ORAL
  Filled 2015-09-13 (×3): qty 1

## 2015-09-13 MED ORDER — OXYCODONE-ACETAMINOPHEN 5-325 MG PO TABS: 1.0000 | ORAL_TABLET | ORAL | 0 refills | Status: DC | PRN

## 2015-09-13 MED ORDER — FENTANYL CITRATE (PF) 100 MCG/2ML IJ SOLN
INTRAMUSCULAR | Status: DC | PRN
  Administered 2015-09-13: 100 ug via INTRAVENOUS
  Administered 2015-09-13: 50 ug via INTRAVENOUS
  Administered 2015-09-13: 100 ug via INTRAVENOUS
  Administered 2015-09-13: 50 ug via INTRAVENOUS

## 2015-09-13 MED ORDER — METOCLOPRAMIDE HCL 5 MG PO TABS: 5.0000 mg | ORAL_TABLET | Freq: Three times a day (TID) | ORAL | Status: DC | PRN

## 2015-09-13 MED ORDER — PHENOL 1.4 % MT LIQD: 1.0000 | OROMUCOSAL | Status: DC | PRN

## 2015-09-13 MED ORDER — APIXABAN 2.5 MG PO TABS: 2.5000 mg | ORAL_TABLET | Freq: Two times a day (BID) | ORAL | 0 refills | Status: DC

## 2015-09-13 MED ORDER — CHLORHEXIDINE GLUCONATE CLOTH 2 % EX PADS
6.0000 | MEDICATED_PAD | Freq: Every day | CUTANEOUS | Status: DC
  Administered 2015-09-14: 6 via TOPICAL

## 2015-09-13 MED ORDER — METHOCARBAMOL 1000 MG/10ML IJ SOLN
500.0000 mg | Freq: Four times a day (QID) | INTRAVENOUS | Status: DC | PRN
  Filled 2015-09-13: qty 5

## 2015-09-13 MED ORDER — TRANEXAMIC ACID 1000 MG/10ML IV SOLN
2000.0000 mg | INTRAVENOUS | Status: AC
  Administered 2015-09-13: 2000 mg via TOPICAL
  Filled 2015-09-13: qty 20

## 2015-09-13 MED ORDER — CHLORHEXIDINE GLUCONATE 4 % EX LIQD: 60.0000 mL | Freq: Once | CUTANEOUS | Status: DC

## 2015-09-13 MED ORDER — POLYETHYLENE GLYCOL 3350 17 G PO PACK: 17.0000 g | PACK | Freq: Every day | ORAL | Status: DC | PRN

## 2015-09-13 MED ORDER — SODIUM CHLORIDE 0.9 % IV SOLN: INTRAVENOUS | Status: DC

## 2015-09-13 MED ORDER — MENTHOL 3 MG MT LOZG: 1.0000 | LOZENGE | OROMUCOSAL | Status: DC | PRN

## 2015-09-13 MED ORDER — SODIUM CHLORIDE 0.9 % IV SOLN
INTRAVENOUS | Status: DC
  Administered 2015-09-13: 20:00:00 via INTRAVENOUS

## 2015-09-13 MED ORDER — MIDAZOLAM HCL 2 MG/2ML IJ SOLN: 0.5000 mg | Freq: Once | INTRAMUSCULAR | Status: DC | PRN

## 2015-09-13 MED ORDER — FENTANYL CITRATE (PF) 100 MCG/2ML IJ SOLN
INTRAMUSCULAR | Status: AC
  Filled 2015-09-13: qty 4

## 2015-09-13 MED ORDER — HYDROMORPHONE HCL 1 MG/ML IJ SOLN
1.0000 mg | INTRAMUSCULAR | Status: DC | PRN
  Administered 2015-09-14 (×4): 1 mg via INTRAVENOUS
  Filled 2015-09-13 (×4): qty 1

## 2015-09-13 MED ORDER — NEOSTIGMINE METHYLSULFATE 10 MG/10ML IV SOLN: INTRAVENOUS | Status: DC | PRN

## 2015-09-13 MED ORDER — PROMETHAZINE HCL 25 MG/ML IJ SOLN: 6.2500 mg | INTRAMUSCULAR | Status: DC | PRN

## 2015-09-13 MED ORDER — PANTOPRAZOLE SODIUM 40 MG PO TBEC
80.0000 mg | DELAYED_RELEASE_TABLET | Freq: Every day | ORAL | Status: DC
  Administered 2015-09-14: 80 mg via ORAL
  Filled 2015-09-13: qty 2

## 2015-09-13 MED ORDER — MIDAZOLAM HCL 2 MG/2ML IJ SOLN
INTRAMUSCULAR | Status: AC
  Filled 2015-09-13: qty 2

## 2015-09-13 MED ORDER — ATORVASTATIN CALCIUM 40 MG PO TABS
40.0000 mg | ORAL_TABLET | Freq: Every day | ORAL | Status: DC
  Administered 2015-09-13: 40 mg via ORAL
  Filled 2015-09-13: qty 1

## 2015-09-13 MED ORDER — MIDAZOLAM HCL 5 MG/5ML IJ SOLN
INTRAMUSCULAR | Status: DC | PRN
  Administered 2015-09-13: 2 mg via INTRAVENOUS

## 2015-09-13 MED ORDER — SODIUM CHLORIDE 0.9 % IR SOLN
Status: DC | PRN
  Administered 2015-09-13: 3000 mL
  Administered 2015-09-13: 1000 mL

## 2015-09-13 MED ORDER — FLEET ENEMA 7-19 GM/118ML RE ENEM: 1.0000 | ENEMA | Freq: Once | RECTAL | Status: DC | PRN

## 2015-09-13 MED ORDER — MUPIROCIN 2 % EX OINT
1.0000 "application " | TOPICAL_OINTMENT | Freq: Two times a day (BID) | CUTANEOUS | Status: DC
  Filled 2015-09-13: qty 22

## 2015-09-13 MED ORDER — FENTANYL CITRATE (PF) 100 MCG/2ML IJ SOLN
INTRAMUSCULAR | Status: AC
  Filled 2015-09-13: qty 2

## 2015-09-13 MED ORDER — INSULIN ASPART 100 UNIT/ML ~~LOC~~ SOLN: 6.0000 [IU] | Freq: Three times a day (TID) | SUBCUTANEOUS | Status: DC

## 2015-09-13 MED ORDER — ROCURONIUM BROMIDE 100 MG/10ML IV SOLN
INTRAVENOUS | Status: DC | PRN
  Administered 2015-09-13: 50 mg via INTRAVENOUS

## 2015-09-13 MED ORDER — COLESTIPOL HCL 1 G PO TABS
3.0000 g | ORAL_TABLET | Freq: Every day | ORAL | Status: DC
  Administered 2015-09-13 – 2015-09-14 (×2): 3 g via ORAL
  Filled 2015-09-13 (×2): qty 3

## 2015-09-13 MED ORDER — BUPIVACAINE-EPINEPHRINE (PF) 0.5% -1:200000 IJ SOLN
INTRAMUSCULAR | Status: DC | PRN
  Administered 2015-09-13: 30 mL via PERINEURAL

## 2015-09-13 SURGICAL SUPPLY — 66 items
BANDAGE ACE 4X5 VEL STRL LF (GAUZE/BANDAGES/DRESSINGS) ×3 IMPLANT
BANDAGE ACE 6X5 VEL STRL LF (GAUZE/BANDAGES/DRESSINGS) ×3 IMPLANT
BANDAGE ELASTIC 4 VELCRO ST LF (GAUZE/BANDAGES/DRESSINGS) ×3 IMPLANT
BANDAGE ELASTIC 6 VELCRO ST LF (GAUZE/BANDAGES/DRESSINGS) ×3 IMPLANT
BANDAGE ESMARK 6X9 LF (GAUZE/BANDAGES/DRESSINGS) ×1 IMPLANT
BLADE SAGITTAL 25.0X1.19X90 (BLADE) ×2 IMPLANT
BLADE SAGITTAL 25.0X1.19X90MM (BLADE) ×1
BLADE SAW SAG 90X13X1.27 (BLADE) ×3 IMPLANT
BNDG ESMARK 6X9 LF (GAUZE/BANDAGES/DRESSINGS) ×3
BONE CEMENT GENTAMICIN (Cement) ×6 IMPLANT
BOWL SMART MIX CTS (DISPOSABLE) ×3 IMPLANT
CAP KNEE TOTAL 3 SIGMA ×3 IMPLANT
CEMENT BONE GENTAMICIN 40 (Cement) ×2 IMPLANT
COVER SURGICAL LIGHT HANDLE (MISCELLANEOUS) ×3 IMPLANT
CUFF TOURNIQUET SINGLE 34IN LL (TOURNIQUET CUFF) ×3 IMPLANT
CUFF TOURNIQUET SINGLE 44IN (TOURNIQUET CUFF) IMPLANT
DRAPE INCISE IOBAN 66X45 STRL (DRAPES) IMPLANT
DRAPE ORTHO SPLIT 77X108 STRL (DRAPES) ×4
DRAPE SURG ORHT 6 SPLT 77X108 (DRAPES) ×2 IMPLANT
DRAPE U-SHAPE 47X51 STRL (DRAPES) ×3 IMPLANT
DRSG ADAPTIC 3X8 NADH LF (GAUZE/BANDAGES/DRESSINGS) ×3 IMPLANT
DRSG PAD ABDOMINAL 8X10 ST (GAUZE/BANDAGES/DRESSINGS) ×3 IMPLANT
DURAPREP 26ML APPLICATOR (WOUND CARE) ×3 IMPLANT
ELECT REM PT RETURN 9FT ADLT (ELECTROSURGICAL) ×3
ELECTRODE REM PT RTRN 9FT ADLT (ELECTROSURGICAL) ×1 IMPLANT
EVACUATOR 1/8 PVC DRAIN (DRAIN) IMPLANT
FACESHIELD WRAPAROUND (MASK) ×6 IMPLANT
FLOSEAL 10ML (HEMOSTASIS) IMPLANT
GAUZE SPONGE 4X4 12PLY STRL (GAUZE/BANDAGES/DRESSINGS) ×3 IMPLANT
GLOVE BIOGEL PI IND STRL 8 (GLOVE) ×4 IMPLANT
GLOVE BIOGEL PI INDICATOR 8 (GLOVE) ×8
GLOVE ORTHO TXT STRL SZ7.5 (GLOVE) ×3 IMPLANT
GLOVE SURG ORTHO 8.0 STRL STRW (GLOVE) ×3 IMPLANT
GOWN STRL REUS W/ TWL LRG LVL3 (GOWN DISPOSABLE) ×2 IMPLANT
GOWN STRL REUS W/ TWL XL LVL3 (GOWN DISPOSABLE) ×1 IMPLANT
GOWN STRL REUS W/TWL 2XL LVL3 (GOWN DISPOSABLE) ×3 IMPLANT
GOWN STRL REUS W/TWL LRG LVL3 (GOWN DISPOSABLE) ×4
GOWN STRL REUS W/TWL XL LVL3 (GOWN DISPOSABLE) ×2
HANDPIECE INTERPULSE COAX TIP (DISPOSABLE) ×2
HOOD PEEL AWAY FACE SHEILD DIS (HOOD) ×3 IMPLANT
IMMOBILIZER KNEE 22 UNIV (SOFTGOODS) IMPLANT
KIT BASIN OR (CUSTOM PROCEDURE TRAY) ×3 IMPLANT
KIT ROOM TURNOVER OR (KITS) ×3 IMPLANT
MANIFOLD NEPTUNE II (INSTRUMENTS) ×3 IMPLANT
NEEDLE 22X1 1/2 (OR ONLY) (NEEDLE) ×6 IMPLANT
NS IRRIG 1000ML POUR BTL (IV SOLUTION) ×3 IMPLANT
PACK TOTAL JOINT (CUSTOM PROCEDURE TRAY) ×3 IMPLANT
PAD ARMBOARD 7.5X6 YLW CONV (MISCELLANEOUS) ×6 IMPLANT
PAD CAST 4YDX4 CTTN HI CHSV (CAST SUPPLIES) ×1 IMPLANT
PADDING CAST COTTON 4X4 STRL (CAST SUPPLIES) ×2
PADDING CAST COTTON 6X4 STRL (CAST SUPPLIES) ×3 IMPLANT
SET HNDPC FAN SPRY TIP SCT (DISPOSABLE) ×1 IMPLANT
SPONGE GAUZE 4X4 12PLY STER LF (GAUZE/BANDAGES/DRESSINGS) ×3 IMPLANT
STAPLER VISISTAT 35W (STAPLE) ×3 IMPLANT
SUCTION FRAZIER HANDLE 10FR (MISCELLANEOUS) ×2
SUCTION TUBE FRAZIER 10FR DISP (MISCELLANEOUS) ×1 IMPLANT
SUT ETHIBOND NAB CT1 #1 30IN (SUTURE) ×9 IMPLANT
SUT VIC AB 0 CT1 27 (SUTURE) ×2
SUT VIC AB 0 CT1 27XBRD ANBCTR (SUTURE) ×1 IMPLANT
SUT VIC AB 2-0 CT1 27 (SUTURE) ×4
SUT VIC AB 2-0 CT1 TAPERPNT 27 (SUTURE) ×2 IMPLANT
SYR CONTROL 10ML LL (SYRINGE) ×6 IMPLANT
TOWEL OR 17X24 6PK STRL BLUE (TOWEL DISPOSABLE) ×3 IMPLANT
TOWEL OR 17X26 10 PK STRL BLUE (TOWEL DISPOSABLE) ×3 IMPLANT
TRAY FOLEY CATH 16FRSI W/METER (SET/KITS/TRAYS/PACK) IMPLANT
WATER STERILE IRR 1000ML POUR (IV SOLUTION) ×3 IMPLANT

## 2015-09-13 NOTE — Anesthesia Procedure Notes (Signed)
Procedure Name: Intubation Date/Time: 09/13/2015 7:49 AM Performed by: Scheryl Darter Pre-anesthesia Checklist: Patient identified, Emergency Drugs available, Suction available and Patient being monitored Patient Re-evaluated:Patient Re-evaluated prior to inductionOxygen Delivery Method: Circle System Utilized Preoxygenation: Pre-oxygenation with 100% oxygen Intubation Type: IV induction Ventilation: Mask ventilation without difficulty Laryngoscope Size: Miller and 2 Grade View: Grade III Tube type: Oral Tube size: 7.5 mm Number of attempts: 1 Airway Equipment and Method: Stylet and Oral airway Placement Confirmation: ETT inserted through vocal cords under direct vision,  positive ETCO2 and breath sounds checked- equal and bilateral Secured at: 21 cm Tube secured with: Tape Dental Injury: Teeth and Oropharynx as per pre-operative assessment  Difficulty Due To: Difficult Airway- due to anterior larynx

## 2015-09-13 NOTE — Progress Notes (Signed)
Inpatient Diabetes Program Recommendations  AACE/ADA: New Consensus Statement on Inpatient Glycemic Control (2015)  Target Ranges:  Prepandial:   less than 140 mg/dL      Peak postprandial:   less than 180 mg/dL (1-2 hours)      Critically ill patients:  140 - 180 mg/dL   Results for JERAMIE, BULLARD (MRN [id number]) as of 09/13/2015 11:15  Ref. Range 09/13/2015 06:25 09/13/2015 09:54  Glucose-Capillary Latest Ref Range: 65 - 99 mg/dL 220 (H) 214 (H)   Review of Glycemic Control  Outpatient Diabetes medications: Basaglar 90 units QAM, Novolog 10 units with breakfast Current orders for Inpatient glycemic control: None at this time; in PACU  Inpatient Diabetes Program Recommendations: Insulin - Basal: If patient is admitted following surgery, please consider ordering Lantus 56 units QAM (based on 112 kg x 0.5 units) and adjust accordingly if needed. Correction (SSI): If patient is admitted following surgery, please consider ordering CBGs with Novolog 0-15 units TID (moderate scale) with meals and Novolog 0-5 units QHS. HgbA1C: A1C 8.1% on 07/15/15.  Patient is followed by Dr. Loanne Drilling for diabetes management.  Thanks, Barnie Alderman, RN, MSN, CDE Diabetes Coordinator Inpatient Diabetes Program [phone] (Team Pager from [place] to [place]) [phone] ([place] office) [phone] [facility] office) [phone] [facility] office)

## 2015-09-13 NOTE — Evaluation (Signed)
Physical Therapy Evaluation Patient Details Name: Katherine Costa MRN: [id number] DOB: Dec 06, 1950 Today's Date: 09/13/2015   History of Present Illness  Pt is a 65 y/o female s/p L TKA.PMH including but not limited to DM type 1 and HTN.  Clinical Impression  Pt presented sitting OOB in recliner when PT entered room, awake and willing to participate in therapy session. Pt's husband and daughter were present throughout session. Pt with c/o of increased nausea and pain with movement which was the limiting factor this session. Pt was able to perform SPT with min guard to return to bed as she requested to be put back into the CPM (0-75 degrees). Pt would continue to benefit from skilled physical therapy services at this time while admitted and after d/c to address her below listed limitations in order to improve her overall safety and independence with functional mobility.      Follow Up Recommendations Home health PT;Supervision for mobility/OOB    Equipment Recommendations  None recommended by PT;Other (comment) (pt reported having all necessary equipment at home)    Recommendations for Other Services       Precautions / Restrictions Precautions Precautions: Knee;Fall Precaution Booklet Issued: Yes (comment) Precaution Comments: PT discussed positioning following TKA with pt and her family. Required Braces or Orthoses: Knee Immobilizer - Left Knee Immobilizer - Left: On when out of bed or walking Restrictions Weight Bearing Restrictions: Yes LLE Weight Bearing: Weight bearing as tolerated      Mobility  Bed Mobility Overal bed mobility: Needs Assistance Bed Mobility: Sit to Supine       Sit to supine: Min assist   General bed mobility comments: pt required increased time and min A with L LE movement  Transfers Overall transfer level: Needs assistance Equipment used: Rolling walker (2 wheeled) Transfers: Sit to/from [facility] Sit to Stand: Min  guard Stand pivot transfers: Min guard       General transfer comment: pt required increased time and VC'ing for bilateral hand positioning  Ambulation/Gait                Stairs            Wheelchair Mobility    Modified Rankin (Stroke Patients Only)       Balance Overall balance assessment: Needs assistance Sitting-balance support: Feet supported;Bilateral upper extremity supported Sitting balance-Leahy Scale: Poor     Standing balance support: During functional activity;Bilateral upper extremity supported Standing balance-Leahy Scale: Poor Standing balance comment: pt reliant on RW for stability and support                             Pertinent Vitals/Pain Pain Assessment: 0-10 Pain Score: 9  Pain Location: posterior L knee and thigh Pain Descriptors / Indicators: Grimacing;Guarding Pain Intervention(s): Monitored during session;Repositioned;Patient requesting pain meds-RN notified    Home Living Family/patient expects to be discharged to:: Private residence Living Arrangements: Spouse/significant other Available Help at Discharge: Family;Available 24 hours/day Type of Home: House Home Access: Level entry     Home Layout: One level Home Equipment: Walker - 2 wheels;Bedside commode Additional Comments: pt stated equipment was delivered yesterday to her home    Prior Function Level of Independence: Independent               Hand Dominance        Extremity/Trunk Assessment   Upper Extremity Assessment: Overall WFL for tasks assessed  Lower Extremity Assessment: LLE deficits/detail   LLE Deficits / Details: Pt with decreased strength and ROM limitations secondary to post-op.  Cervical / Trunk Assessment: Normal  Communication   Communication: No difficulties  Cognition Arousal/Alertness: Awake/alert Behavior During Therapy: WFL for tasks assessed/performed Overall Cognitive Status: Within Functional Limits  for tasks assessed                      General Comments      Exercises Total Joint Exercises Ankle Circles/Pumps: AROM;Strengthening;Left;10 reps;Supine Quad Sets: AROM;Strengthening;Left;10 reps;Supine Heel Slides: AAROM;Strengthening;Left;10 reps;Supine Hip ABduction/ADduction: AROM;Strengthening;Left;10 reps;Supine      Assessment/Plan    PT Assessment Patient needs continued PT services  PT Diagnosis Difficulty walking   PT Problem List Decreased strength;Decreased activity tolerance;Decreased range of motion;Decreased balance;Decreased mobility;Decreased coordination;Decreased knowledge of use of DME;Pain  PT Treatment Interventions DME instruction;Gait training;Stair training;Functional mobility training;Therapeutic activities;Balance training;Neuromuscular re-education;Therapeutic exercise;Patient/family education   PT Goals (Current goals can be found in the Care Plan section) Acute Rehab PT Goals Patient Stated Goal: return home PT Goal Formulation: With patient/family Time For Goal Achievement: 09/20/15 Potential to Achieve Goals: Good    Frequency 7X/week   Barriers to discharge        Co-evaluation               End of Session Equipment Utilized During Treatment: Gait belt;Left knee immobilizer Activity Tolerance: Patient limited by fatigue;Patient limited by pain Patient left: in bed;in CPM;with call bell/phone within reach;with family/visitor present Nurse Communication: Mobility status         Time: 1535-1550 PT Time Calculation (min) (ACUTE ONLY): 15 min   Charges:   PT Evaluation $PT Eval Moderate Complexity: 1 Procedure     PT G CodesClearnce Sorrel Shaymus Eveleth 09/13/2015, 5:29 PM Sherie Don, [place], DPT [phone]

## 2015-09-13 NOTE — Discharge Instructions (Signed)

## 2015-09-13 NOTE — H&P (View-Only) (Signed)
TOTAL KNEE ADMISSION H&P  Patient is being admitted for left total knee arthroplasty.  Subjective:  Chief Complaint:left knee pain.  HPI: Katherine Costa, 65 y.o. female, has a history of pain and functional disability in the left knee due to arthritis and has failed non-surgical conservative treatments for greater than 12 weeks to includeNSAID's and/or analgesics, corticosteriod injections and activity modification.  Onset of symptoms was gradual, starting 10 years ago with gradually worsening course since that time. The patient noted no past surgery on the left knee(s).  Patient currently rates pain in the left knee(s) at 10 out of 10 with activity. Patient has night pain, worsening of pain with activity and weight bearing, pain that interferes with activities of daily living, pain with passive range of motion, crepitus and joint swelling.  Patient has evidence of periarticular osteophytes and joint space narrowing by imaging studies.There is no active infection.  Patient Active Problem List   Diagnosis Date Noted  . Wellness examination 06/01/2014  . Pain in joint, shoulder region 06/01/2014  . Morbid obesity ([place]) 04/21/2013  . Encounter for long-term (current) use of other medications 09/02/2011  . LIVER DISORDER 03/17/2010  . ASYMPTOMATIC POSTMENOPAUSAL STATUS 05/21/2009  . HERPES LABIALIS 03/13/2009  . COUGH 03/13/2009  . HYPERCHOLESTEROLEMIA 02/09/2008  . Type 1 diabetes mellitus ([place]) 08/24/2006  . Essential hypertension 08/24/2006   Past Medical History:  Diagnosis Date  . Arthritis   . ASYMPTOMATIC POSTMENOPAUSAL STATUS 05/21/2009  . DIABETES MELLITUS, TYPE I 08/24/2006  . Family history of colon cancer   . HERPES LABIALIS 03/13/2009  . HYPERCHOLESTEROLEMIA 02/09/2008  . Hyperlipidemia   . HYPERTENSION 08/24/2006  . LIVER DISORDER 03/17/2010  . Tubulovillous adenoma of colon    2010     Past Surgical History:  Procedure Laterality Date  . ABDOMINAL HYSTERECTOMY    .  ACHILLES TENDON REPAIR  2010   right heel  . CHOLECYSTECTOMY  2001     (Not in a hospital admission) Allergies  Allergen Reactions  . Pioglitazone     REACTION: Edema    Social History  Substance Use Topics  . Smoking status: Never Smoker  . Smokeless tobacco: Never Used  . Alcohol use No    Family History  Problem Relation Age of Onset  . Cancer Mother     Colon Cancer, Pancreatic Cancer  . Colon cancer Mother   . Cancer Sister     Colon Cancer  . Diabetes Sister   . Colon cancer Sister   . Cancer Sister     Breast Cancer     Review of Systems  Musculoskeletal: Positive for joint pain.  All other systems reviewed and are negative.   Objective:  Physical Exam  Constitutional: She is oriented to person, place, and time. She appears well-developed and well-nourished. No distress.  HENT:  Head: Normocephalic and atraumatic.  Nose: Nose normal.  Eyes: Conjunctivae and EOM are normal. Pupils are equal, round, and reactive to light.  Neck: Normal range of motion. Neck supple.  Cardiovascular: Normal rate, regular rhythm, normal heart sounds and intact distal pulses.   Respiratory: Effort normal and breath sounds normal. No respiratory distress. She has no wheezes. She has no rales.  GI: Bowel sounds are normal. She exhibits no distension. There is no tenderness.  Musculoskeletal:       Left knee: She exhibits swelling and bony tenderness. Tenderness found.  Lymphadenopathy:    She has no cervical adenopathy.  Neurological: She is alert and oriented to  person, place, and time. No cranial nerve deficit.  Skin: Skin is warm and dry. No rash noted. No erythema.  Psychiatric: She has a normal mood and affect. Her behavior is normal.    Vital signs in last 24 hours: @VSRANGES @  Labs:   Estimated body mass index is 43.26 kg/m as calculated from the following:   Height as of 08/01/15: 5\' 4"  (1.626 m).   Weight as of 08/01/15: 114.3 kg (252 lb).   Imaging  Review Plain radiographs demonstrate moderate degenerative joint disease of the left knee(s). The overall alignment ismild varus. The bone quality appears to be good for age and reported activity level.  Assessment/Plan:  End stage arthritis, left knee   The patient history, physical examination, clinical judgment of the provider and imaging studies are consistent with end stage degenerative joint disease of the left knee(s) and total knee arthroplasty is deemed medically necessary. The treatment options including medical management, injection therapy arthroscopy and arthroplasty were discussed at length. The risks and benefits of total knee arthroplasty were presented and reviewed. The risks due to aseptic loosening, infection, stiffness, patella tracking problems, thromboembolic complications and other imponderables were discussed. The patient acknowledged the explanation, agreed to proceed with the plan and consent was signed. Patient is being admitted for inpatient treatment for surgery, pain control, PT, OT, prophylactic antibiotics, VTE prophylaxis, progressive ambulation and ADL's and discharge planning. The patient is planning to be discharged home with home health services

## 2015-09-13 NOTE — Anesthesia Postprocedure Evaluation (Signed)
Anesthesia Post Note  Patient: Katherine Costa  Procedure(s) Performed: Procedure(s) (LRB): TOTAL KNEE ARTHROPLASTY (Left)  Patient location during evaluation: PACU Anesthesia Type: General and Regional Level of consciousness: awake and alert, patient cooperative and oriented Pain management: pain level controlled Vital Signs Assessment: post-procedure vital signs reviewed and stable Respiratory status: spontaneous breathing, nonlabored ventilation, respiratory function stable and patient connected to nasal cannula oxygen Cardiovascular status: blood pressure returned to baseline and stable Postop Assessment: no signs of nausea or vomiting Anesthetic complications: no    Last Vitals:  Vitals:   09/13/15 1050 09/13/15 1105  BP: (!) 144/65 129/68  Pulse: 90 96  Resp: 11 18  Temp:  36.5 C    Last Pain:  Vitals:   09/13/15 1105  PainSc: 3                  Antoria Lanza,E. Jasiyah Paulding

## 2015-09-13 NOTE — Transfer of Care (Signed)
Immediate Anesthesia Transfer of Care Note  Patient: Katherine Costa  Procedure(s) Performed: Procedure(s): TOTAL KNEE ARTHROPLASTY (Left)  Patient Location: PACU  Anesthesia Type:General and Regional  Level of Consciousness: awake, alert , oriented and sedated  Airway & Oxygen Therapy: Patient Spontanous Breathing and Patient connected to nasal cannula oxygen  Post-op Assessment: Report given to RN, Post -op Vital signs reviewed and stable and Patient moving all extremities  Post vital signs: Reviewed and stable  Last Vitals:  Vitals:   09/13/15 0628 09/13/15 0950  BP: (!) 164/54 (!) 163/74  Pulse: 80 (!) 101  Resp: 20 17  Temp: 36.6 C     Last Pain:  Vitals:   09/13/15 0950  PainSc: (P) 0-No pain      Patients Stated Pain Goal: 3 (0000000 [id number])  Complications: No apparent anesthesia complications

## 2015-09-13 NOTE — Interval H&P Note (Signed)
History and Physical Interval Note:  09/13/2015 7:26 AM  Katherine Costa  has presented today for surgery, with the diagnosis of OA LEFT KNEE  The various methods of treatment have been discussed with the patient and family. After consideration of risks, benefits and other options for treatment, the patient has consented to  Procedure(s): TOTAL KNEE ARTHROPLASTY (Left) as a surgical intervention .  The patient's history has been reviewed, patient examined, no change in status, stable for surgery.  I have reviewed the patient's chart and labs.  Questions were answered to the patient's satisfaction.     Javid Kemler JR,W D

## 2015-09-13 NOTE — Progress Notes (Signed)
Orthopedic Tech Progress Note Patient Details:  Katherine Costa [birth year] [id number]  CPM Left Knee CPM Left Knee: On Left Knee Flexion (Degrees): 90 Left Knee Extension (Degrees): 0 Additional Comments: Trapeze bar and foot roll   Katherine Costa 09/13/2015, 10:50 AM

## 2015-09-13 NOTE — Anesthesia Procedure Notes (Signed)
Anesthesia Regional Block:  Adductor canal block  Pre-Anesthetic Checklist: ,, timeout performed, Correct Patient, Correct Site, Correct Laterality, Correct Procedure, Correct Position, site marked, Risks and benefits discussed,  Surgical consent,  Pre-op evaluation,  At surgeon's request and post-op pain management  Laterality: Left and Lower  Prep: chloraprep       Needles:   Needle Type: Echogenic Needle     Needle Length: 9cm 9 cm Needle Gauge: 22 and 22 G    Additional Needles:  Procedures: ultrasound guided (picture in chart) Adductor canal block Narrative:  Start time: 09/13/2015 7:16 AM End time: 09/13/2015 7:28 AM Injection made incrementally with aspirations every 5 mL.  Performed by: Personally  Anesthesiologist: Glennon Mac, Lexx Monte  Additional Notes: Pt identified in Holding room.  Monitors applied. Working IV access confirmed. Sterile prep, drape L thigh.  #22ga ECHOgenic needle into adductor canal with US guidance.  30cc 0.5% Bupivacaine with 1:200k epi injected incrementally after negative test dose.  Patient asymptomatic, VSS, no heme aspirated, tolerated well.  Jenita Seashore, MD

## 2015-09-13 NOTE — Brief Op Note (Signed)
09/13/2015  9:45 AM  PATIENT:  Katherine Costa  65 y.o. female  PRE-OPERATIVE DIAGNOSIS:  OA LEFT KNEE  POST-OPERATIVE DIAGNOSIS:  OA LEFT KNEE  PROCEDURE:  Procedure(s): TOTAL KNEE ARTHROPLASTY (Left)  SURGEON:  Surgeon(s) and Role:    * Earlie Server, MD - Primary  PHYSICIAN ASSISTANT: Chriss Czar, PA-C  ASSISTANTS: OR staff x1   ANESTHESIA:   local, regional and general  EBL:  Total I/O In: 1000 [I.V.:1000] Out: -   BLOOD ADMINISTERED:none  DRAINS: none   LOCAL MEDICATIONS USED:  MARCAINE     SPECIMEN:  No Specimen  DISPOSITION OF SPECIMEN:  N/A  COUNTS:  YES  TOURNIQUET:   Total Tourniquet Time Documented: Thigh (Left) - 62 minutes Total: Thigh (Left) - 62 minutes   DICTATION: .Other Dictation: Dictation Number unknown  PLAN OF CARE: Admit to inpatient   PATIENT DISPOSITION:  PACU - hemodynamically stable.   Delay start of Pharmacological VTE agent (>24hrs) due to surgical blood loss or risk of bleeding: yes

## 2015-09-14 LAB — BASIC METABOLIC PANEL
ANION GAP: 9 (ref 5–15)
BUN: 11 mg/dL (ref 6–20)
CALCIUM: 9 mg/dL (ref 8.9–10.3)
CHLORIDE: 99 mmol/L — AB (ref 101–111)
CO2: 26 mmol/L (ref 22–32)
CREATININE: 0.73 mg/dL (ref 0.44–1.00)
GFR calc non Af Amer: >60 mL/min (ref >60)
Glucose, Bld: 154 mg/dL — ABNORMAL HIGH (ref 65–99)
Potassium: 3.8 mmol/L (ref 3.5–5.1)
SODIUM: 134 mmol/L — AB (ref 135–145)

## 2015-09-14 LAB — CBC
HEMATOCRIT: 39.5 % (ref 36.0–46.0)
HEMOGLOBIN: 12.6 g/dL (ref 12.0–15.0)
MCH: 29.2 pg (ref 26.0–34.0)
MCHC: 31.9 g/dL (ref 30.0–36.0)
MCV: 91.4 fL (ref 78.0–100.0)
Platelets: 277 10*3/uL (ref 150–400)
RBC: 4.32 MIL/uL (ref 3.87–5.11)
RDW: 13.3 % (ref 11.5–15.5)
WBC: 10.2 10*3/uL (ref 4.0–10.5)

## 2015-09-14 LAB — GLUCOSE, CAPILLARY
GLUCOSE-CAPILLARY: 315 mg/dL — AB (ref 65–99)
Glucose-Capillary: 209 mg/dL — ABNORMAL HIGH (ref 65–99)

## 2015-09-14 NOTE — Progress Notes (Signed)
Orthopedic Tech Progress Note Patient Details:  Katherine Costa Oct 12, 1950 [id number]  Patient ID: Katherine Costa, female   DOB: 02-Nov-1950, 65 y.o.   MRN: [id number] Applied cpm 0-55  Karolee Stamps 09/14/2015, 6:14 AM

## 2015-09-14 NOTE — Progress Notes (Signed)
Physical Therapy Treatment Patient Details Name: Katherine Costa MRN: [id number] DOB: 13-May-1950 Today's Date: 09/14/2015    History of Present Illness Pt is a 65 y/o female s/p L TKA.PMH including but not limited to DM type 1 and HTN.    PT Comments    Pt presented supine in bed with c/o increased pain in her knee. Pt stated that she was due for pain meds soon and would be calling her nurse at that time. Pt declined stair training at this time secondary to pain. Pt continuing to make good progress towards achieving her goals. Pt would continue to benefit from skilled physical therapy services at this time while admitted and after d/c to address her limitations in order to improve her overall safety and independence with functional mobility.    Follow Up Recommendations  Home health PT;Supervision for mobility/OOB     Equipment Recommendations  None recommended by PT    Recommendations for Other Services       Precautions / Restrictions Precautions Precautions: Knee;Fall Precaution Booklet Issued: Yes (comment) Precaution Comments: PT discussed positioning following TKA with pt and her family. Required Braces or Orthoses: Knee Immobilizer - Left Knee Immobilizer - Left: On when out of bed or walking Restrictions Weight Bearing Restrictions: Yes LLE Weight Bearing: Weight bearing as tolerated    Mobility  Bed Mobility Overal bed mobility: Needs Assistance Bed Mobility: Supine to Sit;Sit to Supine     Supine to sit: Min guard;HOB elevated Sit to supine: Min assist   General bed mobility comments: pt required increased time and use of bedrails; min A with L LE when returning to supine  Transfers Overall transfer level: Needs assistance Equipment used: Rolling walker (2 wheeled) Transfers: Sit to/from Stand Sit to Stand: Min guard         General transfer comment: pt required increased time and VC'ing for bilateral hand positioning  Ambulation/Gait Ambulation/Gait  assistance: Min guard Ambulation Distance (Feet): 75 Feet Assistive device: Rolling walker (2 wheeled) Gait Pattern/deviations: Step-through pattern;Decreased step length - right;Decreased stance time - left;Decreased weight shift to left Gait velocity: decreased Gait velocity interpretation: Below normal speed for age/gender General Gait Details: pt required VC'ing for postural corrections and forward gaze   Stairs            Wheelchair Mobility    Modified Rankin (Stroke Patients Only)       Balance Overall balance assessment: Needs assistance Sitting-balance support: Feet supported;No upper extremity supported Sitting balance-Leahy Scale: Fair     Standing balance support: During functional activity;Bilateral upper extremity supported Standing balance-Leahy Scale: Poor Standing balance comment: pt reliant on RW for stability                    Cognition Arousal/Alertness: Awake/alert Behavior During Therapy: WFL for tasks assessed/performed Overall Cognitive Status: Within Functional Limits for tasks assessed                      Exercises Total Joint Exercises Ankle Circles/Pumps: AROM;Strengthening;Left;10 reps;Seated Long Arc Quad: AROM;Left;5 reps;Seated Knee Flexion: AROM;Left;5 reps;Seated Goniometric ROM: Flexion: 75 degrees, Extension: lacking 20 degrees to neutral; measure in sitting    General Comments        Pertinent Vitals/Pain Pain Assessment: Faces Faces Pain Scale: Hurts a little bit Pain Location: L knee Pain Descriptors / Indicators: Grimacing;Guarding;Moaning;Operative site guarding Pain Intervention(s): Monitored during session;Repositioned    Home Living  Prior Function            PT Goals (current goals can now be found in the care plan section) Acute Rehab PT Goals Patient Stated Goal: return home PT Goal Formulation: With patient/family Time For Goal Achievement:  09/20/15 Potential to Achieve Goals: Good Progress towards PT goals: Progressing toward goals    Frequency  7X/week    PT Plan Current plan remains appropriate    Co-evaluation             End of Session Equipment Utilized During Treatment: Gait belt Activity Tolerance: Patient limited by pain Patient left: in bed;with call bell/phone within reach;with family/visitor present     Time: [id number] PT Time Calculation (min) (ACUTE ONLY): 21 min  Charges:  $Gait Training: 8-22 mins                    G CodesClearnce Sorrel Issiac Jamar 09/22/15, 2:27 PM Sherie Don, [place], DPT [phone]

## 2015-09-14 NOTE — Progress Notes (Signed)
Discharge instructions given. Pt verbalized understanding and all questions were answered.  

## 2015-09-14 NOTE — Evaluation (Signed)
Occupational Therapy Evaluation and Discharge Patient Details Name: Katherine Costa MRN: [id number] DOB: December 06, 1950 Today's Date: 09/14/2015    History of Present Illness Pt is a 65 y/o female s/p L TKA.PMH including but not limited to DM type 1 and HTN.   Clinical Impression   This 65 yo female admitted and underwent above presents to acute OT with all education complete with pt and her husband. We will D/C from acute OT.    Follow Up Recommendations  No OT follow up    Equipment Recommendations  None recommended by OT       Precautions / Restrictions Precautions Precautions: Knee;Fall Precaution Booklet Issued: Yes (comment) Precaution Comments: PT discussed positioning following TKA with pt and her family. Required Braces or Orthoses: Knee Immobilizer - Left Knee Immobilizer - Left: On when out of bed or walking Restrictions Weight Bearing Restrictions: No LLE Weight Bearing: Weight bearing as tolerated              ADL                                         General ADL Comments: family will A pt prn for LBADLs. I did educate them on the most efficient sequence of getting dressed. I also educated them on process of getting into and out of shower stall to a seat. I offered to practice this with pt and she politely declined feeling that she and her husband understood the technique               Pertinent Vitals/Pain Pain Assessment: 0-10 Pain Score: 9  Faces Pain Scale: Hurts a little bit Pain Location: left knee Pain Descriptors / Indicators: Aching;Sore;Throbbing;Grimacing;Operative site guarding Pain Intervention(s): Monitored during session;Ice applied;Patient requesting pain meds-RN notified     Hand Dominance Right   Extremity/Trunk Assessment Upper Extremity Assessment Upper Extremity Assessment: Overall WFL for tasks assessed           Communication Communication Communication: No difficulties   Cognition  Arousal/Alertness: Awake/alert Behavior During Therapy: WFL for tasks assessed/performed Overall Cognitive Status: Within Functional Limits for tasks assessed                                Home Living Family/patient expects to be discharged to:: Private residence Living Arrangements: Spouse/significant other Available Help at Discharge: Family;Available 24 hours/day Type of Home: House Home Access: Level entry     Home Layout: One level     Bathroom Shower/Tub: Walk-in shower;Curtain Shower/tub characteristics: Architectural technologist: Standard     Home Equipment: Environmental consultant - 2 wheels;Bedside commode;Hand held shower head   Additional Comments: pt stated equipment was delivered yesterday to her home      Prior Functioning/Environment Level of Independence: Independent             OT Diagnosis: Generalized weakness;Acute pain         OT Goals(Current goals can be found in the care plan section) Acute Rehab OT Goals Patient Stated Goal: home today  OT Frequency:                End of Session Nurse Communication: Patient requests pain meds  Activity Tolerance: Patient limited by pain Patient left: in bed;with call bell/phone within reach;with family/visitor present   Time: 1249-1310 OT Time Calculation (min): 21 min  Charges:  OT General Charges $OT Visit: 1 Procedure OT Evaluation $OT Eval Moderate Complexity: 1 Procedure  Almon Register [id number] 09/14/2015, 4:27 PM

## 2015-09-14 NOTE — Progress Notes (Signed)
Physical Therapy Treatment Patient Details Name: Katherine Costa MRN: [id number] DOB: [birth year] Today's Date: 09/14/2015    History of Present Illness Pt is a 65 y/o female s/p L TKA.PMH including but not limited to DM type 1 and HTN.    PT Comments    Pt presented supine in bed with HOB elevated, awake and willing to participate in therapy session. Pt making great progress towards achieving her goals. Pt increasing distance ambulated today as compared to previous session. Pt would continue to benefit from skilled physical therapy services at this time while admitted and after d/c to address her limitations in order to improve her overall safety and independence with functional mobility.    Follow Up Recommendations  Home health PT;Supervision for mobility/OOB     Equipment Recommendations  None recommended by PT    Recommendations for Other Services       Precautions / Restrictions Precautions Precautions: Knee;Fall Precaution Booklet Issued: Yes (comment) Precaution Comments: PT discussed positioning following TKA with pt and her family. Required Braces or Orthoses: Knee Immobilizer - Left Knee Immobilizer - Left: On when out of bed or walking Restrictions Weight Bearing Restrictions: Yes LLE Weight Bearing: Weight bearing as tolerated    Mobility  Bed Mobility Overal bed mobility: Needs Assistance Bed Mobility: Supine to Sit     Supine to sit: Min assist;HOB elevated     General bed mobility comments: pt required increased time and use of bedrails  Transfers Overall transfer level: Needs assistance Equipment used: Rolling walker (2 wheeled) Transfers: Sit to/from Stand Sit to Stand: Min guard         General transfer comment: pt required increased time and VC'ing for bilateral hand positioning  Ambulation/Gait Ambulation/Gait assistance: Min guard Ambulation Distance (Feet): 50 Feet Assistive device: Rolling walker (2 wheeled) Gait Pattern/deviations:  Step-to pattern;Decreased step length - right;Decreased stance time - left;Decreased weight shift to left Gait velocity: decreased Gait velocity interpretation: Below normal speed for age/gender General Gait Details: pt required VC'ing for postural corrections and forward gaze   Stairs            Wheelchair Mobility    Modified Rankin (Stroke Patients Only)       Balance Overall balance assessment: Needs assistance Sitting-balance support: Feet supported;No upper extremity supported Sitting balance-Leahy Scale: Fair     Standing balance support: During functional activity;Bilateral upper extremity supported Standing balance-Leahy Scale: Poor Standing balance comment: pt reliant on RW for stability                    Cognition Arousal/Alertness: Awake/alert Behavior During Therapy: WFL for tasks assessed/performed Overall Cognitive Status: Within Functional Limits for tasks assessed                      Exercises Total Joint Exercises Ankle Circles/Pumps: AROM;Strengthening;Left;10 reps;Seated Long Arc Quad: AROM;Left;5 reps;Seated Knee Flexion: AROM;Left;5 reps;Seated    General Comments        Pertinent Vitals/Pain Pain Assessment: Faces Faces Pain Scale: Hurts a little bit Pain Location: L LE Pain Descriptors / Indicators: Grimacing;Moaning Pain Intervention(s): Monitored during session;Repositioned    Home Living                      Prior Function            PT Goals (current goals can now be found in the care plan section) Acute Rehab PT Goals Patient Stated Goal: return home PT  Goal Formulation: With patient/family Time For Goal Achievement: 09/20/15 Potential to Achieve Goals: Good Progress towards PT goals: Progressing toward goals    Frequency  7X/week    PT Plan Current plan remains appropriate    Co-evaluation             End of Session Equipment Utilized During Treatment: Gait belt Activity  Tolerance: Patient limited by fatigue;Patient limited by pain Patient left: in chair;with call bell/phone within reach;with family/visitor present     Time: 0935-1010 PT Time Calculation (min) (ACUTE ONLY): 35 min  Charges:  $Gait Training: 23-37 mins                    G CodesClearnce Sorrel Clydia Nieves 09/16/2015, 11:25 AM Sherie Don, [place], DPT [phone]

## 2015-09-14 NOTE — Op Note (Signed)
NAME:  Katherine Costa, Katherine Costa              ACCOUNT NO.:  [id number]  MEDICAL RECORD NO.:  [id number]  LOCATION:  [facility]                         FACILITY:  [facility]  PHYSICIAN:  Lockie Pares, M.D.    DATE OF BIRTH:  10-Jun-1950  DATE OF PROCEDURE:  09/13/2015 DATE OF DISCHARGE:                              OPERATIVE REPORT   PREOPERATIVE DIAGNOSIS:  Severe osteoarthritis, left knee with varus deformity.  POSTOPERATIVE DIAGNOSIS:  Severe osteoarthritis, left knee with varus deformity.  OPERATION:  Left total knee replacement (Sigma size 3 femur, tibia with 12.5 mm rotating platform bearing with 35 mm All-Poly 3 PEG patella).  SURGEON:  Lockie Pares, M.D.  ASSISTANT:  Chriss Czar, PA-C.  ANESTHESIA:  General, femoral nerve block.  TOURNIQUET TIME:  61 minutes.  DESCRIPTION OF PROCEDURE:  Exsanguination of the leg, supine position, straight skin incision medial parapatellar approach to the knee made. We cut 11 mm 5 degree valgus cut of the distal femur.  This was followed by cutting about 4 mm below the most diseased medial compartment after stripping the medial side of the knee.  The extension gap was measured at 12.5 mm.  Femur was sized to be a size 3 with placement of the all-in- one cutting block after assessing the rotation with a provisional cutting block.  Anterior, posterior, and Chamfer cuts were made.  PCL was released.  Posterior osteophytes removed from the posterior-medial aspect of the knee.  Tibia sized 3.  Key hole was cut for the tibia followed by box cut for the femur.  Trials were placed to femur and tibia.  Patella showed rather excessive wear laterally.  We medialized the patella somewhat.  We used a 35 mm All-Poly patella trial.  All trial components were inserted.  She __________ 0 degrees of extension with resolution of her flexion contracture with good stability through all range of motion.  Good balance in ligaments noted with a 12.5 mm bearing.  We used  Exparel with a mixture of saline and Marcaine throughout the capsule, subcutaneous tissues in knee.  We used antibiotic impregnated cement with gentamicin due to the patient's history of diabetes.  Final components were inserted in tibia followed by femur, then patella.  We used a trial bearing.  Cement was allowed to harden.  Trial bearing was removed.  Some small amounts of cement were removed from the posterior aspect of the knee.  We released the tourniquet.  No excessive bleeding was noted.  The final bearing was inserted.  Closure was affected with #1 Ethibond, 0 and 2-0 Vicryl.  Lightly compressive sterile dressing applied with the knee immobilizer.  Taken to recovery room in stable condition.     Lockie Pares, M.D.     WDC/MEDQ  D:  09/13/2015  T:  09/13/2015  Job:  [id number]

## 2015-09-14 NOTE — Care Management Note (Signed)
Case Management Note  Patient Details  Name: Katherine Costa MRN: [id number] Date of Birth: 1950-02-10  Subjective/Objective:  65 yo F s/p L TKA.                  Action/Plan: PT is recommending HHPT   Expected Discharge Date:   09/15/15               Expected Discharge Plan:  [place]  In-House Referral:     Discharge planning Services  CM Consult  Post Acute Care Choice:    Choice offered to:  Patient  DME Arranged:    DME Agency:     HH Arranged:  PT [place]:  [facility] ([facility])  Status of Service:  Completed, signed off  If discussed at [employer] of Stay Meetings, dates discussed:    Additional Comments: met with pt and family. D/C plan is to return home with the support of her family. She reports that she has a lot of family that can help her. She has a RW, 3-in-1 BSC, and a CPM. HHPT was arranged by the doctor's office with [facility]. Pt agreed with agency. She stated that she doesn't have a preference for any agency. Contacted Butch Penny at [facility] who confirmed referral.  Norina Buzzard, RN 09/14/2015, 10:00 AM

## 2015-09-16 ENCOUNTER — Encounter (HOSPITAL_COMMUNITY): Payer: Self-pay | Admitting: Orthopedic Surgery

## 2015-09-16 NOTE — Discharge Summary (Signed)
Patient ID: Katherine Costa MRN: [id number] DOB/AGE: 08/13/1950 65 y.o.  Admit date: 09/13/2015 Discharge date: 09/14/2015 Admission Diagnoses:  Principal Problem:   Primary localized osteoarthritis of left knee Active Problems:   Type 1 diabetes mellitus ([place])   HYPERCHOLESTEROLEMIA   Essential hypertension   Morbid obesity ([place])   Discharge Diagnoses:  Same  Past Medical History:  Diagnosis Date  . Arthritis   . ASYMPTOMATIC POSTMENOPAUSAL STATUS 05/21/2009  . DIABETES MELLITUS, TYPE I 08/24/2006  . Family history of colon cancer   . HERPES LABIALIS 03/13/2009  . HYPERCHOLESTEROLEMIA 02/09/2008  . Hyperlipidemia   . HYPERTENSION 08/24/2006  . Hypertension   . LIVER DISORDER 03/17/2010  . Tubulovillous adenoma of colon    2010     Surgeries: Procedure(s): TOTAL KNEE ARTHROPLASTY on 09/13/2015   Consultants:   Discharged Condition: Improved  Hospital Course: Katherine Costa is an 65 y.o. female who was admitted 09/13/2015 for operative treatment ofPrimary localized osteoarthritis of left knee. Patient has severe unremitting pain that affects sleep, daily activities, and work/hobbies. After pre-op clearance the patient was taken to the operating room on 09/13/2015 and underwent  Procedure(s): TOTAL KNEE ARTHROPLASTY.    Patient was given perioperative antibiotics:  Anti-infectives    Start     Dose/Rate Route Frequency Ordered Stop   09/13/15 1300  vancomycin (VANCOCIN) IVPB 1000 mg/200 mL premix     1,000 mg 200 mL/hr over 60 Minutes Intravenous Every 12 hours 09/13/15 1132 09/13/15 1700   09/13/15 0523  ceFAZolin (ANCEF) IVPB 2g/100 mL premix     2 g 200 mL/hr over 30 Minutes Intravenous On call to O.R. 09/13/15 [id number] 09/13/15 0759       Patient was given sequential compression devices, early ambulation, and chemoprophylaxis to prevent DVT.  Patient benefited maximally from hospital stay and there were no complications.    Recent vital signs: No data found.     Recent laboratory studies:   Recent Labs  09/14/15 0429  WBC 10.2  HGB 12.6  HCT 39.5  PLT 277  NA 134*  K 3.8  CL 99*  CO2 26  BUN 11  CREATININE 0.73  GLUCOSE 154*  CALCIUM 9.0     Discharge Medications:     Medication List    STOP taking these medications   aspirin 81 MG tablet   naproxen 250 MG tablet Commonly known as:  NAPROSYN     TAKE these medications   apixaban 2.5 MG Tabs tablet Commonly known as:  ELIQUIS Take 1 tablet (2.5 mg total) by mouth 2 (two) times daily.   BASAGLAR KWIKPEN 100 UNIT/ML Sopn Inject 0.9 mLs (90 Units total) into the skin every morning.   clotrimazole-betamethasone cream Commonly known as:  LOTRISONE APPLY TOPICALLY THREE TIMES A DAY AS NEEDED FOR RASH.   colestipol 1 g tablet Commonly known as:  MICRONIZED COLESTIPOL HCL Take 3 tablets (3 g total) by mouth daily.   glucose blood test strip Commonly known as:  ONE TOUCH ULTRA TEST 1 each by Other route 2 (two) times daily. And lancets 2/day 250.01   insulin aspart 100 UNIT/ML FlexPen Commonly known as:  NOVOLOG FLEXPEN Inject 10 Units into the skin daily with breakfast. And pen needles 2/day   Insulin Pen Needle 31G X 6 MM Misc Commonly known as:  CLICKFINE PEN NEEDLES Use to check blood sugar 1 time per day.   Insulin Syringe-Needle U-100 30G X 1/2" 0.5 ML Misc Commonly known as:  B-D INS SYR ULTRAFINE .  5CC/30G 4 USE FOUR TIMES A DAY   lisinopril 5 MG tablet Commonly known as:  PRINIVIL,ZESTRIL TAKE 1 TABLET DAILY   omeprazole 40 MG capsule Commonly known as:  PRILOSEC Take 1 capsule (40 mg total) by mouth daily.   oxyCODONE-acetaminophen 5-325 MG tablet Commonly known as:  ROXICET Take 1-2 tablets by mouth every 4 (four) hours as needed for moderate pain or severe pain.   simvastatin 80 MG tablet Commonly known as:  ZOCOR Take 1 tablet (80 mg total) by mouth daily.       Diagnostic Studies: Dg Chest 2 View  Result Date: 09/02/2015 CLINICAL DATA:   Knee replacement. EXAM: CHEST  2 VIEW COMPARISON:  06/21/2012. FINDINGS: Mediastinum and hilar structures normal. Low lung volumes with mild bibasilar atelectasis. Heart size normal. No pleural effusion or pneumothorax. Calcified pulmonary densities noted consistent with granulomas. IMPRESSION: 1.  Calcified pulmonary nodules noted consistent with granulomas. 2.  No acute cardiopulmonary disease. Electronically Signed   By: Marcello Moores  Register   On: 09/02/2015 11:42    Disposition: 01-Home or Self Care  Discharge Instructions    CPM    Complete by:  As directed   Continuous passive motion machine (CPM):      Use the CPM from 0 to 90 for 6 hours per day.       You may break it up into 2 or 3 sessions per day.      Use CPM for 2 weeks or until you are told to stop.   Call MD / Call 911    Complete by:  As directed   If you experience chest pain or shortness of breath, CALL 911 and be transported to the hospital emergency room.  If you develope a fever above 101 F, pus (white drainage) or increased drainage or redness at the wound, or calf pain, call your surgeon's office.   Change dressing    Complete by:  As directed   Change the gauze dressing daily with sterile 4 x 4 inch gauze and apply TED hose.  DO NOT REMOVE BANDAGE OVER SURGICAL INCISION.  [place] WHOLE LEG INCLUDING OVER THE WATERPROOF BANDAGE WITH SOAP AND WATER EVERY DAY.   Constipation Prevention    Complete by:  As directed   Drink plenty of fluids.  Prune juice may be helpful.  You may use a stool softener, such as Colace (over the counter) 100 mg twice a day.  Use MiraLax (over the counter) for constipation as needed.   Diet - low sodium heart healthy    Complete by:  As directed   Discharge instructions    Complete by:  As directed   INSTRUCTIONS AFTER JOINT REPLACEMENT   Remove items at home which could result in a fall. This includes throw rugs or furniture in walking pathways ICE to the affected joint every three hours while awake  for 30 minutes at a time, for at least the first 3-5 days, and then as needed for pain and swelling.  Continue to use ice for pain and swelling. You may notice swelling that will progress down to the foot and ankle.  This is normal after surgery.  Elevate your leg when you are not up walking on it.   Continue to use the breathing machine you got in the hospital (incentive spirometer) which will help keep your temperature down.  It is common for your temperature to cycle up and down following surgery, especially at night when you are not up moving around  and exerting yourself.  The breathing machine keeps your lungs expanded and your temperature down.   DIET:  As you were doing prior to hospitalization, we recommend a well-balanced diet.  DRESSING / WOUND CARE / SHOWERING  Keep the surgical dressing until follow up.  The dressing is water proof, so you can shower without any extra covering.  IF THE DRESSING FALLS OFF or the wound gets wet inside, change the dressing with sterile gauze.  Please use good hand washing techniques before changing the dressing.  Do not use any lotions or creams on the incision until instructed by your surgeon.    ACTIVITY  Increase activity slowly as tolerated, but follow the weight bearing instructions below.   No driving for 6 weeks or until further direction given by your physician.  You cannot drive while taking narcotics.  No lifting or carrying greater than 10 lbs. until further directed by your surgeon. Avoid periods of inactivity such as sitting longer than an hour when not asleep. This helps prevent blood clots.  You may return to work once you are authorized by your doctor.     WEIGHT BEARING   Weight bearing as tolerated with assist device (walker, cane, etc) as directed, use it as long as suggested by your surgeon or therapist, typically at least 2-3 weeks.   EXERCISES  Results after joint replacement surgery are often greatly improved when you follow  the exercise, range of motion and muscle strengthening exercises prescribed by your doctor. Safety measures are also important to protect the joint from further injury. Any time any of these exercises cause you to have increased pain or swelling, decrease what you are doing until you are comfortable again and then slowly increase them. If you have problems or questions, call your caregiver or physical therapist for advice.   Rehabilitation is important following a joint replacement. After just a few days of immobilization, the muscles of the leg can become weakened and shrink (atrophy).  These exercises are designed to build up the tone and strength of the thigh and leg muscles and to improve motion. Often times heat used for twenty to thirty minutes before working out will loosen up your tissues and help with improving the range of motion but do not use heat for the first two weeks following surgery (sometimes heat can increase post-operative swelling).   These exercises can be done on a training (exercise) mat, on the floor, on a table or on a bed. Use whatever works the best and is most comfortable for you.    Use music or television while you are exercising so that the exercises are a pleasant break in your day. This will make your life better with the exercises acting as a break in your routine that you can look forward to.   Perform all exercises about fifteen times, three times per day or as directed.  You should exercise both the operative leg and the other leg as well.   Exercises include:  Quad Sets - Tighten up the muscle on the front of the thigh (Quad) and hold for 5-10 seconds.   Straight Leg Raises - With your knee straight (if you were given a brace, keep it on), lift the leg to 60 degrees, hold for 3 seconds, and slowly lower the leg.  Perform this exercise against resistance later as your leg gets stronger.  Leg Slides: Lying on your back, slowly slide your foot toward your buttocks,  bending your knee up off  the floor (only go as far as is comfortable). Then slowly slide your foot back down until your leg is flat on the floor again.  Angel Wings: Lying on your back spread your legs to the side as far apart as you can without causing discomfort.  Hamstring Strength:  Lying on your back, push your heel against the floor with your leg straight by tightening up the muscles of your buttocks.  Repeat, but this time bend your knee to a comfortable angle, and push your heel against the floor.  You may put a pillow under the heel to make it more comfortable if necessary.   A rehabilitation program following joint replacement surgery can speed recovery and prevent re-injury in the future due to weakened muscles. Contact your doctor or a physical therapist for more information on knee rehabilitation.    CONSTIPATION  Constipation is defined medically as fewer than three stools per week and severe constipation as less than one stool per week.  Even if you have a regular bowel pattern at home, your normal regimen is likely to be disrupted due to multiple reasons following surgery.  Combination of anesthesia, postoperative narcotics, change in appetite and fluid intake all can affect your bowels.   YOU MUST use at least one of the following options; they are listed in order of increasing strength to get the job done.  They are all available over the counter, and you may need to use some, POSSIBLY even all of these options:    Drink plenty of fluids (prune juice may be helpful) and high fiber foods Colace 100 mg by mouth twice a day  Senokot for constipation as directed and as needed Dulcolax (bisacodyl), take with full glass of water  Miralax (polyethylene glycol) once or twice a day as needed.  If you have tried all these things and are unable to have a bowel movement in the first 3-4 days after surgery call either your surgeon or your primary doctor.    If you experience loose stools or  diarrhea, hold the medications until you stool forms back up.  If your symptoms do not get better within 1 week or if they get worse, check with your doctor.  If you experience "the worst abdominal pain ever" or develop nausea or vomiting, please contact the office immediately for further recommendations for treatment.   ITCHING:  If you experience itching with your medications, try taking only a single pain pill, or even half a pain pill at a time.  You can also use Benadryl over the counter for itching or also to help with sleep.   TED HOSE STOCKINGS:  Use stockings on both legs until for at least 2 weeks or as directed by physician office. They may be removed at night for sleeping.  MEDICATIONS:  See your medication summary on the "After Visit Summary" that nursing will review with you.  You may have some home medications which will be placed on hold until you complete the course of blood thinner medication.  It is important for you to complete the blood thinner medication as prescribed.  PRECAUTIONS:  If you experience chest pain or shortness of breath - call 911 immediately for transfer to the hospital emergency department.   If you develop a fever greater that 101 F, purulent drainage from wound, increased redness or drainage from wound, foul odor from the wound/dressing, or calf pain - CONTACT YOUR SURGEON.  FOLLOW-UP APPOINTMENTS:  If you do not already have a post-op appointment, please call the office for an appointment to be seen by your surgeon.  Guidelines for how soon to be seen are listed in your "After Visit Summary", but are typically between 1-4 weeks after surgery.  OTHER INSTRUCTIONS:   Knee Replacement:  Do not place pillow under knee, focus on keeping the knee straight while resting. CPM instructions: 0-90 degrees, 2 hours in the morning, 2 hours in the afternoon, and 2 hours in the evening. Place foam block, curve side up under  heel at all times except when in CPM or when walking.  DO NOT modify, tear, cut, or change the foam block in any way.  MAKE SURE YOU:  Understand these instructions.  Get help right away if you are not doing well or get worse.    Thank you for letting us be a part of your medical care team.  It is a privilege we respect greatly.  We hope these instructions will help you stay on track for a fast and full recovery!   Do not put a pillow under the knee. Place it under the heel.    Complete by:  As directed   Place gray foam block, curve side up under heel at all times except when in CPM or when walking.  DO NOT modify, tear, cut, or change in any way the gray foam block.   Increase activity slowly as tolerated    Complete by:  As directed   Patient may shower    Complete by:  As directed   Aquacel dressing is water proof    Wash over it and the whole leg with soap and water at the end of your shower   TED hose    Complete by:  As directed   Use stockings (TED hose) for 2 weeks on both leg(s).  You may remove them at night for sleeping.      Follow-up Information    CAFFREY JR,W D, MD. Schedule an appointment as soon as possible for a visit in 2 week(s).   Specialty:  Orthopedic Surgery Contact information: [address] [place] [address] [phone]        [facility] .   Why:  [facility] (new name [facility] will follow up for home health physical therapy. Contact information: [place] [address] [phone]            Signed: Linda Hedges 09/16/2015, 9:50 AM

## 2015-12-17 ENCOUNTER — Telehealth: Payer: Self-pay | Admitting: Endocrinology

## 2015-12-17 NOTE — Telephone Encounter (Signed)
please call patient: We received refill request for naproxen.  Do you need a refill of this?

## 2015-12-17 NOTE — Telephone Encounter (Signed)
Patient advised me the refill did not need to be completed.

## 2015-12-25 ENCOUNTER — Other Ambulatory Visit: Payer: Self-pay | Admitting: Endocrinology

## 2015-12-25 ENCOUNTER — Telehealth: Payer: Self-pay | Admitting: Endocrinology

## 2015-12-25 DIAGNOSIS — E109 Type 1 diabetes mellitus without complications: Secondary | ICD-10-CM

## 2015-12-25 MED ORDER — OMEPRAZOLE 40 MG PO CPDR: 40.0000 mg | DELAYED_RELEASE_CAPSULE | Freq: Every day | ORAL | 3 refills | Status: DC

## 2015-12-25 NOTE — Telephone Encounter (Signed)
[facility] called and requested refills of the Naproxin and Omeprezole be sent into the pharmacy.

## 2015-12-25 NOTE — Telephone Encounter (Signed)
Refill for Omeprazole submitted to [facility]. Refill for naproxen has been denied because the patient stated she is not currently taking this medication.

## 2016-02-05 ENCOUNTER — Encounter: Payer: Self-pay | Admitting: Endocrinology

## 2016-02-14 ENCOUNTER — Telehealth: Payer: Self-pay | Admitting: Endocrinology

## 2016-02-14 NOTE — Telephone Encounter (Signed)
Patient ask if she still need to take this medication, simvastatin (ZOCOR) 80 MG tablet. If so send to pharmacy  [facility] [place] [place] [facility] [employer] [phone] (Phone) [phone] (Fax)

## 2016-02-17 MED ORDER — SIMVASTATIN 80 MG PO TABS: 80.0000 mg | ORAL_TABLET | Freq: Every day | ORAL | 3 refills | Status: DC

## 2016-02-17 NOTE — Telephone Encounter (Signed)
Please refill prn 

## 2016-02-17 NOTE — Telephone Encounter (Signed)
See message and please advise. The last time this rx was prescribed was 12/13/2014. Thanks!

## 2016-02-17 NOTE — Telephone Encounter (Signed)
Refill submitted. 

## 2016-02-19 ENCOUNTER — Telehealth: Payer: Self-pay | Admitting: Endocrinology

## 2016-02-19 MED ORDER — SIMVASTATIN 80 MG PO TABS: 80.0000 mg | ORAL_TABLET | Freq: Every day | ORAL | 3 refills | Status: DC

## 2016-02-19 NOTE — Telephone Encounter (Signed)
I contacted the pharmacy and gave the verbal ok for the simvastatin 80 mg per Dr. Cordelia Pen ok. Pharmacy voiced understanding and had no further questions at this time.

## 2016-02-19 NOTE — Telephone Encounter (Signed)
[facility] to [facility] prescription for the Simvastatin 80MG    CB # [phone] Ref # [id number]

## 2016-02-19 NOTE — Telephone Encounter (Signed)
Patient is calling on the status of a fax sent by his pharmacy, they haven't heard anything back.   Please advise

## 2016-02-19 NOTE — Telephone Encounter (Signed)
I contacted the patient and left a voicemail advising we had submitted the prescription of the simvastatin 80 mg on 02/17/2016. Rx resubmitted.

## 2016-04-04 ENCOUNTER — Other Ambulatory Visit: Payer: Self-pay | Admitting: Endocrinology

## 2016-04-20 ENCOUNTER — Other Ambulatory Visit: Payer: Self-pay

## 2016-04-20 DIAGNOSIS — E109 Type 1 diabetes mellitus without complications: Secondary | ICD-10-CM

## 2016-04-20 MED ORDER — INSULIN ASPART 100 UNIT/ML FLEXPEN: 10.0000 [IU] | PEN_INJECTOR | Freq: Every day | SUBCUTANEOUS | 11 refills | Status: DC

## 2016-07-13 ENCOUNTER — Other Ambulatory Visit: Payer: Self-pay | Admitting: Endocrinology

## 2016-07-13 DIAGNOSIS — E109 Type 1 diabetes mellitus without complications: Secondary | ICD-10-CM

## 2016-10-09 ENCOUNTER — Other Ambulatory Visit: Payer: Self-pay | Admitting: Endocrinology

## 2016-10-09 DIAGNOSIS — E109 Type 1 diabetes mellitus without complications: Secondary | ICD-10-CM

## 2016-10-18 ENCOUNTER — Other Ambulatory Visit: Payer: Self-pay | Admitting: Endocrinology

## 2016-10-18 NOTE — Telephone Encounter (Signed)
Please refill x 1 Ov is due  

## 2016-10-19 ENCOUNTER — Other Ambulatory Visit: Payer: Self-pay

## 2016-10-19 MED ORDER — LISINOPRIL 5 MG PO TABS: 5.0000 mg | ORAL_TABLET | Freq: Every day | ORAL | 1 refills | Status: DC

## 2016-10-28 ENCOUNTER — Telehealth: Payer: Self-pay | Admitting: Endocrinology

## 2016-10-28 NOTE — Telephone Encounter (Signed)
Pharmacy called in reference to checking status on renewal for  lisinopril (PRINIVIL,ZESTRIL) 5 MG tablet for patient that was sent over on 10/20/16. [facility] and advise.

## 2016-10-29 ENCOUNTER — Other Ambulatory Visit: Payer: Self-pay

## 2016-10-29 MED ORDER — LISINOPRIL 5 MG PO TABS: 5.0000 mg | ORAL_TABLET | Freq: Every day | ORAL | 1 refills | Status: DC

## 2016-12-08 ENCOUNTER — Other Ambulatory Visit: Payer: Self-pay | Admitting: Endocrinology

## 2016-12-28 ENCOUNTER — Other Ambulatory Visit: Payer: Self-pay | Admitting: Endocrinology

## 2016-12-28 DIAGNOSIS — E109 Type 1 diabetes mellitus without complications: Secondary | ICD-10-CM

## 2017-04-06 ENCOUNTER — Other Ambulatory Visit: Payer: Self-pay | Admitting: Endocrinology

## 2017-06-29 ENCOUNTER — Ambulatory Visit: Payer: Managed Care, Other (non HMO) | Admitting: Endocrinology

## 2017-06-29 ENCOUNTER — Other Ambulatory Visit: Payer: Self-pay

## 2017-06-29 ENCOUNTER — Encounter: Payer: Self-pay | Admitting: Endocrinology

## 2017-06-29 VITALS — BP 160/86 | HR 75 | Wt 248.6 lb

## 2017-06-29 DIAGNOSIS — E108 Type 1 diabetes mellitus with unspecified complications: Secondary | ICD-10-CM

## 2017-06-29 DIAGNOSIS — Z23 Encounter for immunization: Secondary | ICD-10-CM | POA: Diagnosis not present

## 2017-06-29 DIAGNOSIS — Z0000 Encounter for general adult medical examination without abnormal findings: Secondary | ICD-10-CM | POA: Diagnosis not present

## 2017-06-29 DIAGNOSIS — E109 Type 1 diabetes mellitus without complications: Secondary | ICD-10-CM

## 2017-06-29 LAB — URINALYSIS, ROUTINE W REFLEX MICROSCOPIC
Bilirubin Urine: NEGATIVE
Hgb urine dipstick: NEGATIVE
Ketones, ur: NEGATIVE
Leukocytes, UA: NEGATIVE
Nitrite: NEGATIVE
PH: 7.5 (ref 5.0–8.0)
RBC / HPF: NONE SEEN (ref 0–?)
Specific Gravity, Urine: 1.015 (ref 1.000–1.030)
TOTAL PROTEIN, URINE-UPE24: NEGATIVE
Urine Glucose: 100 — AB
Urobilinogen, UA: 0.2 (ref 0.0–1.0)
WBC, UA: NONE SEEN (ref 0–?)

## 2017-06-29 LAB — BASIC METABOLIC PANEL
BUN: 13 mg/dL (ref 6–23)
CALCIUM: 9.6 mg/dL (ref 8.4–10.5)
CO2: 31 meq/L (ref 19–32)
CREATININE: 0.67 mg/dL (ref 0.40–1.20)
Chloride: 104 mEq/L (ref 96–112)
GFR: 93.93 mL/min (ref >60.00)
GLUCOSE: 135 mg/dL — AB (ref 70–99)
Potassium: 4.7 mEq/L (ref 3.5–5.1)
Sodium: 139 mEq/L (ref 135–145)

## 2017-06-29 LAB — LIPID PANEL
CHOL/HDL RATIO: 2
Cholesterol: 188 mg/dL (ref 0–200)
HDL: 78.5 mg/dL (ref >39.00)
LDL Cholesterol: 92 mg/dL (ref 0–99)
NONHDL: 109.04
Triglycerides: 84 mg/dL (ref 0.0–149.0)
VLDL: 16.8 mg/dL (ref 0.0–40.0)

## 2017-06-29 LAB — CBC WITH DIFFERENTIAL/PLATELET
Basophils Absolute: 0 10*3/uL (ref 0.0–0.1)
Basophils Relative: 1 % (ref 0.0–3.0)
EOS PCT: 3 % (ref 0.0–5.0)
Eosinophils Absolute: 0.2 10*3/uL (ref 0.0–0.7)
HCT: 42.2 % (ref 36.0–46.0)
Hemoglobin: 14.4 g/dL (ref 12.0–15.0)
LYMPHS ABS: 1.1 10*3/uL (ref 0.7–4.0)
Lymphocytes Relative: 21.2 % (ref 12.0–46.0)
MCHC: 34.1 g/dL (ref 30.0–36.0)
MCV: 90.1 fl (ref 78.0–100.0)
MONOS PCT: 9 % (ref 3.0–12.0)
Monocytes Absolute: 0.5 10*3/uL (ref 0.1–1.0)
NEUTROS PCT: 65.8 % (ref 43.0–77.0)
Neutro Abs: 3.4 10*3/uL (ref 1.4–7.7)
Platelets: 272 10*3/uL (ref 150.0–400.0)
RBC: 4.68 Mil/uL (ref 3.87–5.11)
RDW: 13.5 % (ref 11.5–15.5)
WBC: 5.2 10*3/uL (ref 4.0–10.5)

## 2017-06-29 LAB — HEPATIC FUNCTION PANEL
ALBUMIN: 3.8 g/dL (ref 3.5–5.2)
ALT: 22 U/L (ref 0–35)
AST: 25 U/L (ref 0–37)
Alkaline Phosphatase: 101 U/L (ref 39–117)
Bilirubin, Direct: 0.1 mg/dL (ref 0.0–0.3)
Total Bilirubin: 0.6 mg/dL (ref 0.2–1.2)
Total Protein: 6.4 g/dL (ref 6.0–8.3)

## 2017-06-29 LAB — POCT GLYCOSYLATED HEMOGLOBIN (HGB A1C): HEMOGLOBIN A1C: 9.7 % — AB (ref 4.0–5.6)

## 2017-06-29 LAB — TSH: TSH: 1.49 u[IU]/mL (ref 0.35–4.50)

## 2017-06-29 LAB — MICROALBUMIN / CREATININE URINE RATIO
Creatinine,U: 66.4 mg/dL
Microalb Creat Ratio: 1.1 mg/g (ref 0.0–30.0)
Microalb, Ur: <0.7 mg/dL (ref 0.0–1.9)

## 2017-06-29 MED ORDER — BETAMETHASONE DIPROPIONATE AUG 0.05 % EX OINT: TOPICAL_OINTMENT | Freq: Three times a day (TID) | CUTANEOUS | 2 refills | Status: DC | PRN

## 2017-06-29 MED ORDER — BETAMETHASONE DIPROPIONATE AUG 0.05 % EX OINT: TOPICAL_OINTMENT | Freq: Three times a day (TID) | CUTANEOUS | 2 refills | Status: AC | PRN

## 2017-06-29 MED ORDER — LISINOPRIL-HYDROCHLOROTHIAZIDE 10-12.5 MG PO TABS: 0.5000 | ORAL_TABLET | Freq: Every day | ORAL | 3 refills | Status: DC

## 2017-06-29 MED ORDER — BASAGLAR KWIKPEN 100 UNIT/ML ~~LOC~~ SOPN
PEN_INJECTOR | SUBCUTANEOUS | 3 refills | Status: DC
Start: 2017-06-29 — End: 2017-09-29

## 2017-06-29 MED ORDER — INSULIN ASPART 100 UNIT/ML FLEXPEN: PEN_INJECTOR | SUBCUTANEOUS | 4 refills | Status: DC

## 2017-06-29 NOTE — Patient Instructions (Addendum)
blood tests are requested for you today.  We'll let you know about the results. Please consider these measures for your health:  minimize alcohol.  Do not use tobacco products.  Have a colonoscopy at least every 10 years from age 65.  Women should have an annual mammogram from age 64.  Keep firearms safely stored.  Always use seat belts.  have working smoke alarms in your home.  See an eye doctor and dentist regularly.  Never drive under the influence of alcohol or drugs (including prescription drugs).  Those with fair skin should take precautions against the sun, and should carefully examine their skin once per month, for any new or changed moles. I have sent a prescription to your pharmacy, to change the lisinopril to one which contains a tiny amount of a fluid pill, and for the skin cream Please come back for a follow-up appointment in 2-3 months.

## 2017-06-29 NOTE — Progress Notes (Signed)
Subjective:    Patient ID: Katherine Costa, female    DOB: 10-06-1950, 67 y.o.   MRN: [id number]  HPI Pt is here for regular wellness examination, and is feeling pretty well in general, and says chronic med probs are stable, except as noted below Past Medical History:  Diagnosis Date  . Arthritis   . ASYMPTOMATIC POSTMENOPAUSAL STATUS 05/21/2009  . DIABETES MELLITUS, TYPE I 08/24/2006  . Family history of colon cancer   . HERPES LABIALIS 03/13/2009  . HYPERCHOLESTEROLEMIA 02/09/2008  . Hyperlipidemia   . HYPERTENSION 08/24/2006  . Hypertension   . LIVER DISORDER 03/17/2010  . Tubulovillous adenoma of colon    2010     Past Surgical History:  Procedure Laterality Date  . ABDOMINAL HYSTERECTOMY    . ACHILLES TENDON REPAIR  2010   right heel  . CHOLECYSTECTOMY  2001  . TOTAL KNEE ARTHROPLASTY Left 09/13/2015   Procedure: TOTAL KNEE ARTHROPLASTY;  Surgeon: Earlie Server, MD;  Location: [facility];  Service: Orthopedics;  Laterality: Left;    Social History   Socioeconomic History  . Marital status: Married    Spouse name: Not on file  . Number of children: 3  . Years of education: Not on file  . Highest education level: Not on file  Occupational History  . Occupation: Teacher, music  Social Needs  . Financial resource strain: Not on file  . Food insecurity:    Worry: Not on file    Inability: Not on file  . Transportation needs:    Medical: Not on file    Non-medical: Not on file  Tobacco Use  . Smoking status: Never Smoker  . Smokeless tobacco: Never Used  Substance and Sexual Activity  . Alcohol use: No    Alcohol/week: 0.0 oz  . Drug use: No  . Sexual activity: Not on file  Lifestyle  . Physical activity:    Days per week: Not on file    Minutes per session: Not on file  . Stress: Not on file  Relationships  . Social connections:    Talks on phone: Not on file    Gets together: Not on file    Attends religious service: Not on file    Active member  of club or organization: Not on file    Attends meetings of clubs or organizations: Not on file    Relationship status: Not on file  . Intimate partner violence:    Fear of current or ex partner: Not on file    Emotionally abused: Not on file    Physically abused: Not on file    Forced sexual activity: Not on file  Other Topics Concern  . Not on file  Social History Narrative   PT does not get regular exercise   3 girls    Current Outpatient Medications on File Prior to Visit  Medication Sig Dispense Refill  . clotrimazole-betamethasone (LOTRISONE) cream APPLY TOPICALLY THREE TIMES A DAY AS NEEDED FOR RASH. 1 g 0  . colestipol (MICRONIZED COLESTIPOL HCL) 1 G tablet Take 3 tablets (3 g total) by mouth daily. 270 tablet 2  . glucose blood (ONE TOUCH ULTRA TEST) test strip 1 each by Other route 2 (two) times daily. And lancets 2/day 250.01 180 each 3  . Insulin Pen Needle (CLICKFINE PEN NEEDLES) 31G X 6 MM MISC Use to check blood sugar 1 time per day. 100 each 2  . omeprazole (PRILOSEC) 40 MG capsule TAKE 1 CAPSULE DAILY 90 capsule  3  . simvastatin (ZOCOR) 80 MG tablet Take 1 tablet (80 mg total) by mouth daily. 90 tablet 3   No current facility-administered medications on file prior to visit.     Allergies  Allergen Reactions  . Pioglitazone     REACTION: Edema    Family History  Problem Relation Age of Onset  . Cancer Mother        Colon Cancer, Pancreatic Cancer  . Colon cancer Mother   . Cancer Sister        Colon Cancer  . Diabetes Sister   . Colon cancer Sister   . Cancer Sister        Breast Cancer    BP (!) 160/86   Pulse 75   Wt 248 lb 9.6 oz (112.8 kg)   SpO2 97%   BMI 42.67 kg/m    Review of Systems Denies fever, fatigue, visual loss, hearing loss, chest pain, sob, back pain, depression, cold intolerance, BRBPR, hematuria, syncope, numbness, allergy sxs, easy bruising, and rash.       Objective:   Physical Exam VS: see vs page GEN: no  distress HEAD: head: no deformity eyes: no periorbital swelling, no proptosis external nose and ears are normal mouth: no lesion seen NECK: supple, thyroid is not enlarged CHEST WALL: no deformity LUNGS:  Clear to auscultation BREASTS: sees gyn CV: reg rate and rhythm, no murmur ABD: abdomen is soft, nontender.  no hepatosplenomegaly.  not distended.  no hernia GENITALIA/RECTAL: sees gyn MUSCULOSKELETAL: muscle bulk and strength are grossly normal.  no obvious joint swelling.  gait is normal and steady PULSES: no carotid bruit NEURO:  cn 2-12 grossly intact.   readily moves all 4's.  SKIN:  Normal texture and temperature.  No rash or suspicious lesion is visible.   NODES:  None palpable at the neck PSYCH: alert, well-oriented.  Does not appear anxious nor depressed.   I personally reviewed electrocardiogram tracing (today): Indication: DM Impression: NSR.  No MI.  No hypertrophy. Compared to 2016: no significant change     Assessment & Plan:  Wellness visit today, with problems stable, except as noted.    SEPARATE EVALUATION FOLLOWS--EACH PROBLEM HERE IS NEW, NOT RESPONDING TO TREATMENT, OR POSES SIGNIFICANT RISK TO THE PATIENT'S HEALTH: HISTORY OF THE PRESENT ILLNESS: Pt returns for f/u of diabetes mellitus: DM type: 1 Dx'ed: 8182 Complications: none Therapy: insulin since 1997.  GDM: never DKA: once (1997) Severe hypoglycemia: never.  Pancreatitis: never.  Other: she declines weight-loss surgery; in 2015, she was changed to qd insulin, as she could not remember multiple daily injections; she works 1st shift at [employer].   Interval history: no cbg record, but states cbg's are in the 100's.  pt states she feels well in general.   PAST MEDICAL HISTORY Past Medical History:  Diagnosis Date  . Arthritis   . ASYMPTOMATIC POSTMENOPAUSAL STATUS 05/21/2009  . DIABETES MELLITUS, TYPE I 08/24/2006  . Family history of colon cancer   . HERPES LABIALIS 03/13/2009  .  HYPERCHOLESTEROLEMIA 02/09/2008  . Hyperlipidemia   . HYPERTENSION 08/24/2006  . Hypertension   . LIVER DISORDER 03/17/2010  . Tubulovillous adenoma of colon    2010     Past Surgical History:  Procedure Laterality Date  . ABDOMINAL HYSTERECTOMY    . ACHILLES TENDON REPAIR  2010   right heel  . CHOLECYSTECTOMY  2001  . TOTAL KNEE ARTHROPLASTY Left 09/13/2015   Procedure: TOTAL KNEE ARTHROPLASTY;  Surgeon: Earlie Server, MD;  Location: [facility];  Service: Orthopedics;  Laterality: Left;    Social History   Socioeconomic History  . Marital status: Married    Spouse name: Not on file  . Number of children: 3  . Years of education: Not on file  . Highest education level: Not on file  Occupational History  . Occupation: Teacher, music  Social Needs  . Financial resource strain: Not on file  . Food insecurity:    Worry: Not on file    Inability: Not on file  . Transportation needs:    Medical: Not on file    Non-medical: Not on file  Tobacco Use  . Smoking status: Never Smoker  . Smokeless tobacco: Never Used  Substance and Sexual Activity  . Alcohol use: No    Alcohol/week: 0.0 oz  . Drug use: No  . Sexual activity: Not on file  Lifestyle  . Physical activity:    Days per week: Not on file    Minutes per session: Not on file  . Stress: Not on file  Relationships  . Social connections:    Talks on phone: Not on file    Gets together: Not on file    Attends religious service: Not on file    Active member of club or organization: Not on file    Attends meetings of clubs or organizations: Not on file    Relationship status: Not on file  . Intimate partner violence:    Fear of current or ex partner: Not on file    Emotionally abused: Not on file    Physically abused: Not on file    Forced sexual activity: Not on file  Other Topics Concern  . Not on file  Social History Narrative   PT does not get regular exercise   3 girls    Current Outpatient  Medications on File Prior to Visit  Medication Sig Dispense Refill  . clotrimazole-betamethasone (LOTRISONE) cream APPLY TOPICALLY THREE TIMES A DAY AS NEEDED FOR RASH. 1 g 0  . colestipol (MICRONIZED COLESTIPOL HCL) 1 G tablet Take 3 tablets (3 g total) by mouth daily. 270 tablet 2  . glucose blood (ONE TOUCH ULTRA TEST) test strip 1 each by Other route 2 (two) times daily. And lancets 2/day 250.01 180 each 3  . Insulin Pen Needle (CLICKFINE PEN NEEDLES) 31G X 6 MM MISC Use to check blood sugar 1 time per day. 100 each 2  . omeprazole (PRILOSEC) 40 MG capsule TAKE 1 CAPSULE DAILY 90 capsule 3  . simvastatin (ZOCOR) 80 MG tablet Take 1 tablet (80 mg total) by mouth daily. 90 tablet 3   No current facility-administered medications on file prior to visit.     Allergies  Allergen Reactions  . Pioglitazone     REACTION: Edema    Family History  Problem Relation Age of Onset  . Cancer Mother        Colon Cancer, Pancreatic Cancer  . Colon cancer Mother   . Cancer Sister        Colon Cancer  . Diabetes Sister   . Colon cancer Sister   . Cancer Sister        Breast Cancer    BP (!) 160/86   Pulse 75   Wt 248 lb 9.6 oz (112.8 kg)   SpO2 97%   BMI 42.67 kg/m   REVIEW OF SYSTEMS: She denies hypoglycemia.  She has itching due to doing yard work  PHYSICAL EXAMINATION: VITAL SIGNS:  See vs page GENERAL: no distress Pulses: dorsalis pedis intact bilat.   MSK: no deformity of the feet CV: 1+ bilat leg edema.   Skin:  no ulcer on the feet.  normal color and temp on the feet. Neuro: sensation is intact to touch on the feet Ext: There is bilateral onychomycosis of the toenails LAB/XRAY RESULTS: Lab Results  Component Value Date   HGBA1C 9.7 (A) 06/29/2017   IMPRESSION: Insulin-requiring type 2 DM:  Chronic intermitt pruritis, due to contact dermatitis.  HTN: she needs increased rx PLAN:  change the lisinopril to one which contains a tiny amount of a fluid pill I rx'ed skin  cream Increase insulin.  See med list for dosages

## 2017-07-01 LAB — HEPATITIS C ANTIBODY
HEP C AB: NONREACTIVE
SIGNAL TO CUT-OFF: 0.02 (ref <1.00)

## 2017-07-01 LAB — FRUCTOSAMINE: FRUCTOSAMINE: 339 umol/L — AB (ref 190–270)

## 2017-07-01 LAB — HIV ANTIBODY (ROUTINE TESTING W REFLEX): HIV 1&2 Ab, 4th Generation: NONREACTIVE

## 2017-08-16 ENCOUNTER — Telehealth: Payer: Self-pay | Admitting: Emergency Medicine

## 2017-08-16 NOTE — Telephone Encounter (Signed)
Pt called about picking up her paperwork that she left [facility] 08/12/17. Please let her know when it is ready for pickup thanks.

## 2017-08-17 ENCOUNTER — Other Ambulatory Visit: Payer: Self-pay

## 2017-08-17 ENCOUNTER — Telehealth: Payer: Self-pay | Admitting: Endocrinology

## 2017-08-17 NOTE — Telephone Encounter (Signed)
Patient needs paper work she brought in to be filled out immediately or she will lose her Driver's License. Please call patient at ph# [phone]

## 2017-08-17 NOTE — Telephone Encounter (Signed)
done

## 2017-08-17 NOTE — Telephone Encounter (Signed)
I called and let patient know that Dr. Loanne Drilling has paperwork to fill out. That I would call her rather it be today or tomorrow so she can pick that up. I am also making Katherine Costa aware that if he finishes paperwork this afternoon to please give patient a call so she can pick it up ASAP.

## 2017-08-17 NOTE — Telephone Encounter (Signed)
I have given paperwork to you to fill out & I will call patient ASAP when done.

## 2017-09-29 ENCOUNTER — Ambulatory Visit: Payer: 59 | Admitting: Endocrinology

## 2017-09-29 ENCOUNTER — Encounter: Payer: Self-pay | Admitting: Endocrinology

## 2017-09-29 VITALS — BP 154/80 | HR 73 | Wt 243.4 lb

## 2017-09-29 DIAGNOSIS — E108 Type 1 diabetes mellitus with unspecified complications: Secondary | ICD-10-CM

## 2017-09-29 LAB — POCT GLYCOSYLATED HEMOGLOBIN (HGB A1C): Hemoglobin A1C: 9.6 % — AB (ref 4.0–5.6)

## 2017-09-29 MED ORDER — INSULIN LISPRO 100 UNIT/ML (KWIKPEN): 10.0000 [IU] | PEN_INJECTOR | Freq: Every day | SUBCUTANEOUS | 11 refills | Status: DC

## 2017-09-29 MED ORDER — INSULIN GLARGINE 100 UNIT/ML SOLOSTAR PEN: 90.0000 [IU] | PEN_INJECTOR | SUBCUTANEOUS | 11 refills | Status: DC

## 2017-09-29 MED ORDER — LISINOPRIL-HYDROCHLOROTHIAZIDE 10-12.5 MG PO TABS: 1.0000 | ORAL_TABLET | Freq: Every day | ORAL | 3 refills | Status: DC

## 2017-09-29 NOTE — Patient Instructions (Addendum)
A different type of blood test is requested for you today.  We'll let you know about the results.   I have sent a prescription to your pharmacy, to increase the blood pressure pill to a whole pill.  check your blood sugar twice a day.  vary the time of day when you check, between before the 3 meals, and at bedtime.  also check if you have symptoms of your blood sugar being too high or too low.  please keep a record of the readings and bring it to your next appointment here (or you can bring the meter itself).  You can write it on any piece of paper.  please call us sooner if your blood sugar goes below 70, or if you have a lot of readings over 200.   Please come back for a follow-up appointment in 3 months.

## 2017-09-29 NOTE — Progress Notes (Signed)
Subjective:    Patient ID: Katherine Costa, female    DOB: October 12, 1950, 67 y.o.   MRN: [id number]  HPI Pt returns for f/u of diabetes mellitus: DM type: 1 Dx'ed: 0092 Complications: none Therapy: insulin since 1997.  GDM: never DKA: once (1997) Severe hypoglycemia: never.  Pancreatitis: never.  Other: she declines weight-loss surgery; in 2015, she was changed to qd insulin, as she could not remember multiple daily injections; she works 1st shift at [employer].   Interval history: She says she never misses the insulin.  no cbg record, but she says cbg's are in the low-100's.  pt states she feels well in general. Past Medical History:  Diagnosis Date  . Arthritis   . ASYMPTOMATIC POSTMENOPAUSAL STATUS 05/21/2009  . DIABETES MELLITUS, TYPE I 08/24/2006  . Family history of colon cancer   . HERPES LABIALIS 03/13/2009  . HYPERCHOLESTEROLEMIA 02/09/2008  . Hyperlipidemia   . HYPERTENSION 08/24/2006  . Hypertension   . LIVER DISORDER 03/17/2010  . Tubulovillous adenoma of colon    2010     Past Surgical History:  Procedure Laterality Date  . ABDOMINAL HYSTERECTOMY    . ACHILLES TENDON REPAIR  2010   right heel  . CHOLECYSTECTOMY  2001  . TOTAL KNEE ARTHROPLASTY Left 09/13/2015   Procedure: TOTAL KNEE ARTHROPLASTY;  Surgeon: Earlie Server, MD;  Location: [facility];  Service: Orthopedics;  Laterality: Left;    Social History   Socioeconomic History  . Marital status: Married    Spouse name: Not on file  . Number of children: 3  . Years of education: Not on file  . Highest education level: Not on file  Occupational History  . Occupation: Teacher, music  Social Needs  . Financial resource strain: Not on file  . Food insecurity:    Worry: Not on file    Inability: Not on file  . Transportation needs:    Medical: Not on file    Non-medical: Not on file  Tobacco Use  . Smoking status: Never Smoker  . Smokeless tobacco: Never Used  Substance and Sexual  Activity  . Alcohol use: No    Alcohol/week: 0.0 standard drinks  . Drug use: No  . Sexual activity: Not on file  Lifestyle  . Physical activity:    Days per week: Not on file    Minutes per session: Not on file  . Stress: Not on file  Relationships  . Social connections:    Talks on phone: Not on file    Gets together: Not on file    Attends religious service: Not on file    Active member of club or organization: Not on file    Attends meetings of clubs or organizations: Not on file    Relationship status: Not on file  . Intimate partner violence:    Fear of current or ex partner: Not on file    Emotionally abused: Not on file    Physically abused: Not on file    Forced sexual activity: Not on file  Other Topics Concern  . Not on file  Social History Narrative   PT does not get regular exercise   3 girls    Current Outpatient Medications on File Prior to Visit  Medication Sig Dispense Refill  . augmented betamethasone dipropionate (DIPROLENE) 0.05 % ointment Apply topically 3 (three) times daily as needed. rash 50 g 2  . clotrimazole-betamethasone (LOTRISONE) cream APPLY TOPICALLY THREE TIMES A DAY AS NEEDED FOR RASH.  1 g 0  . colestipol (MICRONIZED COLESTIPOL HCL) 1 G tablet Take 3 tablets (3 g total) by mouth daily. 270 tablet 2  . glucose blood (ONE TOUCH ULTRA TEST) test strip 1 each by Other route 2 (two) times daily. And lancets 2/day 250.01 180 each 3  . Insulin Pen Needle (CLICKFINE PEN NEEDLES) 31G X 6 MM MISC Use to check blood sugar 1 time per day. 100 each 2  . omeprazole (PRILOSEC) 40 MG capsule TAKE 1 CAPSULE DAILY 90 capsule 3  . simvastatin (ZOCOR) 80 MG tablet Take 1 tablet (80 mg total) by mouth daily. 90 tablet 3   No current facility-administered medications on file prior to visit.     Allergies  Allergen Reactions  . Pioglitazone     REACTION: Edema    Family History  Problem Relation Age of Onset  . Cancer Mother        Colon Cancer,  Pancreatic Cancer  . Colon cancer Mother   . Cancer Sister        Colon Cancer  . Diabetes Sister   . Colon cancer Sister   . Cancer Sister        Breast Cancer    BP (!) 154/80 (BP Location: Left Arm, Patient Position: Sitting, Cuff Size: Normal)   Pulse 73   Wt 243 lb 6.4 oz (110.4 kg)   SpO2 97%   BMI 41.78 kg/m   Review of Systems She denies hypoglycemia    Objective:   Physical Exam VITAL SIGNS:  See vs page GENERAL: no distress Pulses: foot pulses are intact bilaterally.   MSK: no deformity of the feet or ankles.  CV: no edema of the legs or ankles Skin:  no ulcer on the feet or ankles.  normal color and temp on the feet and ankles Neuro: sensation is intact to touch on the feet and ankles.   Ext: There is bilateral onychomycosis of the toenails  Lab Results  Component Value Date   HGBA1C 9.6 (A) 09/29/2017       Assessment & Plan:  type 1 DM: she needs increased rx.  a1c is much higher than would be suggested by cbg's.    Patient Instructions  A different type of blood test is requested for you today.  We'll let you know about the results.   I have sent a prescription to your pharmacy, to increase the blood pressure pill to a whole pill.  check your blood sugar twice a day.  vary the time of day when you check, between before the 3 meals, and at bedtime.  also check if you have symptoms of your blood sugar being too high or too low.  please keep a record of the readings and bring it to your next appointment here (or you can bring the meter itself).  You can write it on any piece of paper.  please call us sooner if your blood sugar goes below 70, or if you have a lot of readings over 200.   Please come back for a follow-up appointment in 3 months.

## 2017-09-30 LAB — FRUCTOSAMINE: FRUCTOSAMINE: 337 umol/L — AB (ref 190–270)

## 2017-10-05 ENCOUNTER — Other Ambulatory Visit: Payer: Self-pay

## 2017-10-05 MED ORDER — INSULIN GLARGINE 100 UNIT/ML SOLOSTAR PEN: 100.0000 [IU] | PEN_INJECTOR | SUBCUTANEOUS | 11 refills | Status: DC

## 2017-10-05 NOTE — Progress Notes (Signed)
[facility] is calling for clarification on the lantus rx that was called in today. Quantity does not appear to be enough to cover her for a month. # Wells Guiles [phone]

## 2017-10-05 NOTE — Progress Notes (Signed)
Spoke to pharmacist and ok'd quantity to 30

## 2017-10-29 ENCOUNTER — Ambulatory Visit (INDEPENDENT_AMBULATORY_CARE_PROVIDER_SITE_OTHER): Payer: Medicare Other

## 2017-10-29 DIAGNOSIS — Z23 Encounter for immunization: Secondary | ICD-10-CM

## 2017-10-29 NOTE — Progress Notes (Signed)
Pt came into the office today for a flu vaccine only. Pt tolerated the injection well. Flu shot given by [place], RMA

## 2017-12-16 DIAGNOSIS — Z85820 Personal history of malignant melanoma of skin: Secondary | ICD-10-CM | POA: Diagnosis not present

## 2017-12-16 DIAGNOSIS — D2239 Melanocytic nevi of other parts of face: Secondary | ICD-10-CM | POA: Diagnosis not present

## 2017-12-16 DIAGNOSIS — L821 Other seborrheic keratosis: Secondary | ICD-10-CM | POA: Diagnosis not present

## 2017-12-16 DIAGNOSIS — D225 Melanocytic nevi of trunk: Secondary | ICD-10-CM | POA: Diagnosis not present

## 2017-12-31 ENCOUNTER — Ambulatory Visit: Payer: Medicare Other | Admitting: Endocrinology

## 2018-01-25 ENCOUNTER — Other Ambulatory Visit: Payer: Self-pay | Admitting: Orthopedic Surgery

## 2018-01-25 DIAGNOSIS — M8700 Idiopathic aseptic necrosis of unspecified bone: Secondary | ICD-10-CM

## 2018-01-25 DIAGNOSIS — M25551 Pain in right hip: Secondary | ICD-10-CM

## 2018-02-01 ENCOUNTER — Ambulatory Visit
Admission: RE | Admit: 2018-02-01 | Discharge: 2018-02-01 | Disposition: A | Discharge status: Home / Self Care | Payer: Medicare Other | Source: Ambulatory Visit | Attending: Orthopedic Surgery | Admitting: Orthopedic Surgery

## 2018-02-01 DIAGNOSIS — M8700 Idiopathic aseptic necrosis of unspecified bone: Secondary | ICD-10-CM

## 2018-02-01 DIAGNOSIS — M25551 Pain in right hip: Secondary | ICD-10-CM | POA: Diagnosis not present

## 2018-02-04 ENCOUNTER — Ambulatory Visit (INDEPENDENT_AMBULATORY_CARE_PROVIDER_SITE_OTHER): Payer: Medicare Other | Admitting: Nurse Practitioner

## 2018-02-04 ENCOUNTER — Encounter: Payer: Self-pay | Admitting: Nurse Practitioner

## 2018-02-04 VITALS — BP 140/70 | HR 81 | Ht 64.0 in | Wt 242.0 lb

## 2018-02-04 DIAGNOSIS — E7800 Pure hypercholesterolemia, unspecified: Secondary | ICD-10-CM | POA: Diagnosis not present

## 2018-02-04 DIAGNOSIS — I10 Essential (primary) hypertension: Secondary | ICD-10-CM | POA: Diagnosis not present

## 2018-02-04 DIAGNOSIS — E109 Type 1 diabetes mellitus without complications: Secondary | ICD-10-CM

## 2018-02-04 DIAGNOSIS — K219 Gastro-esophageal reflux disease without esophagitis: Secondary | ICD-10-CM | POA: Diagnosis not present

## 2018-02-04 MED ORDER — COLESTIPOL HCL 1 G PO TABS: 3.0000 g | ORAL_TABLET | Freq: Every day | ORAL | 2 refills | Status: DC

## 2018-02-04 MED ORDER — SIMVASTATIN 80 MG PO TABS: 80.0000 mg | ORAL_TABLET | Freq: Every day | ORAL | 3 refills | Status: DC

## 2018-02-04 NOTE — Patient Instructions (Addendum)
Please try to check your blood pressure once daily or at least a few times a week, at the same time each day, and keep a log. Please follow up for readings > 140/90  Please return in about 3 months so I can check on your blood pressure   DASH Eating Plan DASH stands for "Dietary Approaches to Stop Hypertension." The DASH eating plan is a healthy eating plan that has been shown to reduce high blood pressure (hypertension). It may also reduce your risk for type 2 diabetes, heart disease, and stroke. The DASH eating plan may also help with weight loss. What are tips for following this plan?  General guidelines  Avoid eating more than 2,300 mg (milligrams) of salt (sodium) a day. If you have hypertension, you may need to reduce your sodium intake to 1,500 mg a day.  Limit alcohol intake to no more than 1 drink a day for nonpregnant women and 2 drinks a day for men. One drink equals 12 oz of beer, 5 oz of wine, or 1 oz of hard liquor.  Work with your health care provider to maintain a healthy body weight or to lose weight. Ask what an ideal weight is for you.  Get at least 30 minutes of exercise that causes your heart to beat faster (aerobic exercise) most days of the week. Activities may include walking, swimming, or biking.  Work with your health care provider or diet and nutrition specialist (dietitian) to adjust your eating plan to your individual calorie needs. Reading food labels   Check food labels for the amount of sodium per serving. Choose foods with less than 5 percent of the Daily Value of sodium. Generally, foods with less than 300 mg of sodium per serving fit into this eating plan.  To find whole grains, look for the word "whole" as the first word in the ingredient list. Shopping  Buy products labeled as "low-sodium" or "no salt added."  Buy fresh foods. Avoid canned foods and premade or frozen meals. Cooking  Avoid adding salt when cooking. Use salt-free seasonings or  herbs instead of table salt or sea salt. Check with your health care provider or pharmacist before using salt substitutes.  Do not fry foods. Cook foods using healthy methods such as baking, boiling, grilling, and broiling instead.  Cook with heart-healthy oils, such as olive, canola, soybean, or sunflower oil. Meal planning  Eat a balanced diet that includes: ? 5 or more servings of fruits and vegetables each day. At each meal, try to fill half of your plate with fruits and vegetables. ? Up to 6-8 servings of whole grains each day. ? Less than 6 oz of lean meat, poultry, or fish each day. A 3-oz serving of meat is about the same size as a deck of cards. One egg equals 1 oz. ? 2 servings of low-fat dairy each day. ? A serving of nuts, seeds, or beans 5 times each week. ? Heart-healthy fats. Healthy fats called Omega-3 fatty acids are found in foods such as flaxseeds and coldwater fish, like sardines, salmon, and mackerel.  Limit how much you eat of the following: ? Canned or prepackaged foods. ? Food that is high in trans fat, such as fried foods. ? Food that is high in saturated fat, such as fatty meat. ? Sweets, desserts, sugary drinks, and other foods with added sugar. ? Full-fat dairy products.  Do not salt foods before eating.  Try to eat at least 2 vegetarian meals each  week.  Eat more home-cooked food and less restaurant, buffet, and fast food.  When eating at a restaurant, ask that your food be prepared with less salt or no salt, if possible. What foods are recommended? The items listed may not be a complete list. Talk with your dietitian about what dietary choices are best for you. Grains Whole-grain or whole-wheat bread. Whole-grain or whole-wheat pasta. Brown rice. [place]. Bulgur. Whole-grain and low-sodium cereals. Pita bread. Low-fat, low-sodium crackers. Whole-wheat flour tortillas. Vegetables Fresh or frozen vegetables (raw, steamed, roasted, or grilled).  Low-sodium or reduced-sodium tomato and vegetable juice. Low-sodium or reduced-sodium tomato sauce and tomato paste. Low-sodium or reduced-sodium canned vegetables. Fruits All fresh, dried, or frozen fruit. Canned fruit in natural juice (without added sugar). Meat and other protein foods Skinless chicken or [place]. Ground chicken or [place]. Pork with fat trimmed off. Fish and seafood. Egg whites. Dried beans, peas, or lentils. Unsalted nuts, nut butters, and seeds. Unsalted canned beans. Lean cuts of beef with fat trimmed off. Low-sodium, lean deli meat. Dairy Low-fat (1%) or fat-free (skim) milk. Fat-free, low-fat, or reduced-fat cheeses. Nonfat, low-sodium ricotta or cottage cheese. Low-fat or nonfat yogurt. Low-fat, low-sodium cheese. Fats and oils Soft margarine without trans fats. Vegetable oil. Low-fat, reduced-fat, or light mayonnaise and salad dressings (reduced-sodium). Canola, safflower, olive, soybean, and sunflower oils. Avocado. Seasoning and other foods Herbs. Spices. Seasoning mixes without salt. Unsalted popcorn and pretzels. Fat-free sweets. What foods are not recommended? The items listed may not be a complete list. Talk with your dietitian about what dietary choices are best for you. Grains Baked goods made with fat, such as croissants, muffins, or some breads. Dry pasta or rice meal packs. Vegetables Creamed or fried vegetables. Vegetables in a cheese sauce. Regular canned vegetables (not low-sodium or reduced-sodium). Regular canned tomato sauce and paste (not low-sodium or reduced-sodium). Regular tomato and vegetable juice (not low-sodium or reduced-sodium). Angie Fava. Olives. Fruits Canned fruit in a light or heavy syrup. Fried fruit. Fruit in cream or butter sauce. Meat and other protein foods Fatty cuts of meat. Ribs. Fried meat. Berniece Salines. Sausage. Bologna and other processed lunch meats. Salami. Fatback. Hotdogs. Bratwurst. Salted nuts and seeds. Canned beans with added  salt. Canned or smoked fish. Whole eggs or egg yolks. Chicken or [place] with skin. Dairy Whole or 2% milk, cream, and half-and-half. Whole or full-fat cream cheese. Whole-fat or sweetened yogurt. Full-fat cheese. Nondairy creamers. Whipped toppings. Processed cheese and cheese spreads. Fats and oils Butter. Stick margarine. Lard. Shortening. Ghee. Bacon fat. Tropical oils, such as coconut, palm kernel, or palm oil. Seasoning and other foods Salted popcorn and pretzels. Onion salt, garlic salt, seasoned salt, table salt, and sea salt. Worcestershire sauce. Tartar sauce. Barbecue sauce. Teriyaki sauce. Soy sauce, including reduced-sodium. Steak sauce. Canned and packaged gravies. Fish sauce. Oyster sauce. Cocktail sauce. Horseradish that you find on the shelf. Ketchup. Mustard. Meat flavorings and tenderizers. Bouillon cubes. Hot sauce and Tabasco sauce. Premade or packaged marinades. Premade or packaged taco seasonings. Relishes. Regular salad dressings. Where to find more information:  National Heart, Lung, and [place]: https://wilson-eaton.com/  American Heart Association: www.heart.org Summary  The DASH eating plan is a healthy eating plan that has been shown to reduce high blood pressure (hypertension). It may also reduce your risk for type 2 diabetes, heart disease, and stroke.  With the DASH eating plan, you should limit salt (sodium) intake to 2,300 mg a day. If you have hypertension, you may need to reduce your  sodium intake to 1,500 mg a day.  When on the DASH eating plan, aim to eat more fresh fruits and vegetables, whole grains, lean proteins, low-fat dairy, and heart-healthy fats.  Work with your health care provider or diet and nutrition specialist (dietitian) to adjust your eating plan to your individual calorie needs. This information is not intended to replace advice given to you by your health care provider. Make sure you discuss any questions you have with your health care  provider. Document Released: 01/01/2011 Document Revised: 01/06/2016 Document Reviewed: 01/06/2016 Elsevier Interactive Patient Education  2019 [employer].

## 2018-02-04 NOTE — Progress Notes (Signed)
Katherine Costa is a 68 y.o. female with the following history as recorded in EpicCare:  Patient Active Problem List   Diagnosis Date Noted  . Primary localized osteoarthritis of left knee 09/13/2015  . Screening examination for infectious disease 06/01/2014  . Pain in joint, shoulder region 06/01/2014  . Morbid obesity ([place]) 04/21/2013  . Encounter for long-term (current) use of other medications 09/02/2011  . LIVER DISORDER 03/17/2010  . ASYMPTOMATIC POSTMENOPAUSAL STATUS 05/21/2009  . HERPES LABIALIS 03/13/2009  . COUGH 03/13/2009  . HYPERCHOLESTEROLEMIA 02/09/2008  . Type 1 diabetes mellitus ([place]) 08/24/2006  . Essential hypertension 08/24/2006    Current Outpatient Medications  Medication Sig Dispense Refill  . augmented betamethasone dipropionate (DIPROLENE) 0.05 % ointment Apply topically 3 (three) times daily as needed. rash 50 g 2  . CINNAMON PO Take 1 each by mouth daily.    . clotrimazole-betamethasone (LOTRISONE) cream APPLY TOPICALLY THREE TIMES A DAY AS NEEDED FOR RASH. 1 g 0  . colestipol (MICRONIZED COLESTIPOL HCL) 1 g tablet Take 3 tablets (3 g total) by mouth daily. 270 tablet 2  . glucose blood (ONE TOUCH ULTRA TEST) test strip 1 each by Other route 2 (two) times daily. And lancets 2/day 250.01 180 each 3  . Insulin Glargine (LANTUS SOLOSTAR) 100 UNIT/ML Solostar Pen Inject 100 Units into the skin every morning. (Patient taking differently: Inject 90 Units into the skin every morning. ) 30 pen 11  . insulin lispro (HUMALOG KWIKPEN) 100 UNIT/ML KiwkPen Inject 0.1 mLs (10 Units total) into the skin daily with breakfast. And pen needles 2/day 15 mL 11  . Insulin Pen Needle (CLICKFINE PEN NEEDLES) 31G X 6 MM MISC Use to check blood sugar 1 time per day. 100 each 2  . lisinopril-hydrochlorothiazide (PRINZIDE,ZESTORETIC) 10-12.5 MG tablet Take 1 tablet by mouth daily. 90 tablet 3  . Omega-3 1000 MG CAPS Take 1 capsule by mouth daily.    Marland Kitchen omeprazole (PRILOSEC) 40 MG  capsule TAKE 1 CAPSULE DAILY 90 capsule 3  . simvastatin (ZOCOR) 80 MG tablet Take 1 tablet (80 mg total) by mouth daily. 90 tablet 3   No current facility-administered medications for this visit.     Allergies: Pioglitazone  Past Medical History:  Diagnosis Date  . Arthritis   . ASYMPTOMATIC POSTMENOPAUSAL STATUS 05/21/2009  . DIABETES MELLITUS, TYPE I 08/24/2006  . Family history of colon cancer   . HERPES LABIALIS 03/13/2009  . HYPERCHOLESTEROLEMIA 02/09/2008  . Hyperlipidemia   . HYPERTENSION 08/24/2006  . Hypertension   . LIVER DISORDER 03/17/2010  . Tubulovillous adenoma of colon    2010     Past Surgical History:  Procedure Laterality Date  . ABDOMINAL HYSTERECTOMY    . ACHILLES TENDON REPAIR  2010   right heel  . CHOLECYSTECTOMY  2001  . TOTAL KNEE ARTHROPLASTY Left 09/13/2015   Procedure: TOTAL KNEE ARTHROPLASTY;  Surgeon: Earlie Server, MD;  Location: [facility];  Service: Orthopedics;  Laterality: Left;    Family History  Problem Relation Age of Onset  . Cancer Mother        Colon Cancer, Pancreatic Cancer  . Colon cancer Mother   . Cancer Sister        Colon Cancer  . Diabetes Sister   . Colon cancer Sister   . Cancer Sister        Breast Cancer    Social History   Tobacco Use  . Smoking status: Never Smoker  . Smokeless tobacco: Never Used  Substance  Use Topics  . Alcohol use: No    Alcohol/week: 0.0 standard drinks     Subjective:  [name] is here today to establish care as a new patient to our practice, transferring from her endocrinologist who was managing her diabetes and primary care until now, she will continue to follow up with endocrinology for regular diabetes care. Aside from primary care, she is currently following with orthopedics for hip pain and Dr Nori Riis for routine GYN care including mammogram, paps. We will review current daily medications provided by prior pcp during todays OV   Hypertension -maintained on lisinopril-hctz 10-12.5 Reports  daily medication compliance without noted adverse medication effects. Reports she does not routinely check BP readings at home but has a BP monitor at home Denies headaches, vision changes, chest pain, shortness of breath, edema.  BP Readings from Last 3 Encounters:  02/04/18 140/70  09/29/17 (!) 154/80  06/29/17 (!) 160/86   GERD- maintained on omeprazole 40 Reports daily medication compliance without noted adverse effects, but does experience symptoms of GERD if she does not take the medication daily  HLD- maintained on simvastatin 80, colestipol 1g TID, omega 3 Reports she has been on these medications for some time now, taking daily without adverse medication effects.  Lab Results  Component Value Date   CHOL 188 06/29/2017   HDL 78.50 06/29/2017   LDLCALC 92 06/29/2017   LDLDIRECT 110.6 09/02/2011   TRIG 84.0 06/29/2017   CHOLHDL 2 06/29/2017    ROS- See HPI  Objective:  Vitals:   02/04/18 1418  BP: 140/70  Pulse: 81  SpO2: 95%  Weight: 242 lb (109.8 kg)  Height: 5\' 4"  (1.626 m)    General: Well developed, well nourished, in no acute distress  Skin : Warm and dry.  Head: Normocephalic and atraumatic  Eyes: Sclera and conjunctiva clear; pupils round and reactive to light; extraocular movements intact  Oropharynx: Pink, supple. No suspicious lesions  Neck: Supple Lungs: Respirations unlabored; clear to auscultation bilaterally without wheeze, rales, rhonchi  CVS exam: normal rate, regular rhythm, normal S1, S2, no murmurs, rubs, clicks or gallops.  Musculoskeletal: No deformities; no active joint inflammation  Extremities: No edema, cyanosis, clubbing  Vessels: Symmetric bilaterally  Neurologic: Alert and oriented; speech intact; face symmetrical; moves all extremities well; CNII-XII intact without focal deficit  Psychiatric: Normal mood and affect.   Assessment:  1. Essential hypertension   2. Type 1 diabetes mellitus without complication (HCC)   3.  HYPERCHOLESTEROLEMIA   4. Gastroesophageal reflux disease, esophagitis presence not specified     Plan:  06/29/17 TSH, BMET, lipid panel, CBC w/diff, hepatic function panel stable Will request GYN records for pap/mammo from Dr Nori Riis for patients chart  Return in about 3 months (around 05/06/2018) for BP follow up.  No orders of the defined types were placed in this encounter.   Requested Prescriptions   Signed Prescriptions Disp Refills  . colestipol (MICRONIZED COLESTIPOL HCL) 1 g tablet 270 tablet 2    Sig: Take 3 tablets (3 g total) by mouth daily.  . simvastatin (ZOCOR) 80 MG tablet 90 tablet 3    Sig: Take 1 tablet (80 mg total) by mouth daily.

## 2018-02-07 DIAGNOSIS — M25551 Pain in right hip: Secondary | ICD-10-CM | POA: Diagnosis not present

## 2018-02-11 DIAGNOSIS — K219 Gastro-esophageal reflux disease without esophagitis: Secondary | ICD-10-CM | POA: Insufficient documentation

## 2018-02-11 NOTE — Assessment & Plan Note (Signed)
Stable but on higher end of normal Continue current medications She was instructed to begin monitoring BP readings at home, follow up for readings >140/90, she is agreeable to this Discussed the role of healthy diet and exercise in the management of HTN and provided additional information on AVS RTC in 3 months for f/u- recheck BP, home logs

## 2018-02-11 NOTE — Assessment & Plan Note (Signed)
Stable Continue current medications F/u for new, worsening symptoms

## 2018-02-11 NOTE — Assessment & Plan Note (Signed)
Stable on current medications without noted adverse effects, continue Lipid panel up to date

## 2018-02-11 NOTE — Assessment & Plan Note (Signed)
Continue regular follow up with endocrinology

## 2018-02-28 DIAGNOSIS — Z803 Family history of malignant neoplasm of breast: Secondary | ICD-10-CM | POA: Diagnosis not present

## 2018-02-28 DIAGNOSIS — Z1231 Encounter for screening mammogram for malignant neoplasm of breast: Secondary | ICD-10-CM | POA: Diagnosis not present

## 2018-02-28 LAB — HM PAP SMEAR: HM Pap smear: NEGATIVE

## 2018-02-28 LAB — HM MAMMOGRAPHY

## 2018-05-04 ENCOUNTER — Ambulatory Visit (INDEPENDENT_AMBULATORY_CARE_PROVIDER_SITE_OTHER): Payer: Medicare Other | Admitting: Family Medicine

## 2018-05-04 ENCOUNTER — Encounter: Payer: Self-pay | Admitting: Family Medicine

## 2018-05-04 ENCOUNTER — Other Ambulatory Visit: Payer: Self-pay

## 2018-05-04 DIAGNOSIS — N390 Urinary tract infection, site not specified: Secondary | ICD-10-CM | POA: Diagnosis not present

## 2018-05-04 MED ORDER — CIPROFLOXACIN HCL 500 MG PO TABS: 500.0000 mg | ORAL_TABLET | Freq: Two times a day (BID) | ORAL | 0 refills | Status: DC

## 2018-05-04 MED ORDER — OMEPRAZOLE 40 MG PO CPDR: 40.0000 mg | DELAYED_RELEASE_CAPSULE | Freq: Every day | ORAL | 0 refills | Status: DC

## 2018-05-04 NOTE — Progress Notes (Signed)
Subjective:    Patient ID: Katherine Costa, female    DOB: 04-May-1950, 68 y.o.   MRN: [id number]  HPI She says that for the past week she has had urinary burning and frequency. No back pain or fever. Drinking lots of fluids. Her last UTI was many years ago. Her fasting glucoses have been averaging 90-120 lately.  Virtual Visit via Video Note  I connected with the patient on 05/04/18 at  1:00 PM EDT by a video enabled telemedicine application and verified that I am speaking with the correct person using two identifiers.  Location patient: home Location provider:work or home office Persons participating in the virtual visit: patient, provider  I discussed the limitations of evaluation and management by telemedicine and the availability of in person appointments. The patient expressed understanding and agreed to proceed.   HPI:    ROS: See pertinent positives and negatives per HPI.  Past Medical History:  Diagnosis Date  . Arthritis   . ASYMPTOMATIC POSTMENOPAUSAL STATUS 05/21/2009  . DIABETES MELLITUS, TYPE I 08/24/2006  . Family history of colon cancer   . HERPES LABIALIS 03/13/2009  . HYPERCHOLESTEROLEMIA 02/09/2008  . Hyperlipidemia   . HYPERTENSION 08/24/2006  . Hypertension   . LIVER DISORDER 03/17/2010  . Tubulovillous adenoma of colon    2010     Past Surgical History:  Procedure Laterality Date  . ABDOMINAL HYSTERECTOMY    . ACHILLES TENDON REPAIR  2010   right heel  . CHOLECYSTECTOMY  2001  . TOTAL KNEE ARTHROPLASTY Left 09/13/2015   Procedure: TOTAL KNEE ARTHROPLASTY;  Surgeon: Earlie Server, MD;  Location: [facility];  Service: Orthopedics;  Laterality: Left;    Family History  Problem Relation Age of Onset  . Cancer Mother        Colon Cancer, Pancreatic Cancer  . Colon cancer Mother   . Cancer Sister        Colon Cancer  . Diabetes Sister   . Colon cancer Sister   . Cancer Sister        Breast Cancer     Current Outpatient Medications:  .  augmented  betamethasone dipropionate (DIPROLENE) 0.05 % ointment, Apply topically 3 (three) times daily as needed. rash, Disp: 50 g, Rfl: 2 .  CINNAMON PO, Take 1 each by mouth daily., Disp: , Rfl:  .  clotrimazole-betamethasone (LOTRISONE) cream, APPLY TOPICALLY THREE TIMES A DAY AS NEEDED FOR RASH., Disp: 1 g, Rfl: 0 .  colestipol (MICRONIZED COLESTIPOL HCL) 1 g tablet, Take 3 tablets (3 g total) by mouth daily., Disp: 270 tablet, Rfl: 2 .  glucose blood (ONE TOUCH ULTRA TEST) test strip, 1 each by Other route 2 (two) times daily. And lancets 2/day 250.01, Disp: 180 each, Rfl: 3 .  Insulin Glargine (LANTUS SOLOSTAR) 100 UNIT/ML Solostar Pen, Inject 100 Units into the skin every morning. (Patient taking differently: Inject 90 Units into the skin every morning. ), Disp: 30 pen, Rfl: 11 .  insulin lispro (HUMALOG KWIKPEN) 100 UNIT/ML KiwkPen, Inject 0.1 mLs (10 Units total) into the skin daily with breakfast. And pen needles 2/day, Disp: 15 mL, Rfl: 11 .  Insulin Pen Needle (CLICKFINE PEN NEEDLES) 31G X 6 MM MISC, Use to check blood sugar 1 time per day., Disp: 100 each, Rfl: 2 .  lisinopril-hydrochlorothiazide (PRINZIDE,ZESTORETIC) 10-12.5 MG tablet, Take 1 tablet by mouth daily., Disp: 90 tablet, Rfl: 3 .  omeprazole (PRILOSEC) 40 MG capsule, Take 1 capsule (40 mg total) by mouth daily., Disp: 90  capsule, Rfl: 0 .  simvastatin (ZOCOR) 80 MG tablet, Take 1 tablet (80 mg total) by mouth daily., Disp: 90 tablet, Rfl: 3 .  ciprofloxacin (CIPRO) 500 MG tablet, Take 1 tablet (500 mg total) by mouth 2 (two) times daily., Disp: 14 tablet, Rfl: 0 .  Omega-3 1000 MG CAPS, Take 1 capsule by mouth daily., Disp: , Rfl:   EXAM:  VITALS per patient if applicable:  GENERAL: alert, oriented, appears well and in no acute distress  HEENT: atraumatic, conjunttiva clear, no obvious abnormalities on inspection of external nose and ears  NECK: normal movements of the head and neck  LUNGS: on inspection no signs of  respiratory distress, breathing rate appears normal, no obvious gross SOB, gasping or wheezing  CV: no obvious cyanosis  MS: moves all visible extremities without noticeable abnormality  PSYCH/NEURO: pleasant and cooperative, no obvious depression or anxiety, speech and thought processing grossly intact  ASSESSMENT AND PLAN: UTI, treat with Cipro. Recheck prn. Alysia Penna, MD  Discussed the following assessment and plan:  No diagnosis found.     I discussed the assessment and treatment plan with the patient. The patient was provided an opportunity to ask questions and all were answered. The patient agreed with the plan and demonstrated an understanding of the instructions.   The patient was advised to call back or seek an in-person evaluation if the symptoms worsen or if the condition fails to improve as anticipated.    Review of Systems       Objective:   Physical Exam        Assessment & Plan:

## 2018-05-09 ENCOUNTER — Ambulatory Visit: Payer: Medicare Other | Admitting: Nurse Practitioner

## 2018-06-10 ENCOUNTER — Other Ambulatory Visit: Payer: Self-pay

## 2018-06-10 NOTE — Patient Outreach (Signed)
[facility]) Care Management  06/10/2018  JADON RESSLER 16-Aug-1950 [id number]   Medication Adherence call to Mrs. [place] Compliant Voice message left with a call back number. Mrs. Jerrol Banana is showing past due on Simvastatin 80 mg under [facility].   [facility] Management Direct Dial [phone]  Fax [phone] [email or web address]

## 2018-06-24 ENCOUNTER — Encounter: Payer: Self-pay | Admitting: Family Medicine

## 2018-06-24 ENCOUNTER — Other Ambulatory Visit: Payer: Self-pay

## 2018-06-24 ENCOUNTER — Ambulatory Visit (INDEPENDENT_AMBULATORY_CARE_PROVIDER_SITE_OTHER): Payer: Medicare Other | Admitting: Family Medicine

## 2018-06-24 DIAGNOSIS — K219 Gastro-esophageal reflux disease without esophagitis: Secondary | ICD-10-CM

## 2018-06-24 DIAGNOSIS — Z9119 Patient's noncompliance with other medical treatment and regimen: Secondary | ICD-10-CM

## 2018-06-24 DIAGNOSIS — N3946 Mixed incontinence: Secondary | ICD-10-CM

## 2018-06-24 DIAGNOSIS — E7800 Pure hypercholesterolemia, unspecified: Secondary | ICD-10-CM

## 2018-06-24 DIAGNOSIS — I10 Essential (primary) hypertension: Secondary | ICD-10-CM

## 2018-06-24 DIAGNOSIS — R32 Unspecified urinary incontinence: Secondary | ICD-10-CM | POA: Insufficient documentation

## 2018-06-24 DIAGNOSIS — E109 Type 1 diabetes mellitus without complications: Secondary | ICD-10-CM

## 2018-06-24 DIAGNOSIS — Z91199 Patient's noncompliance with other medical treatment and regimen due to unspecified reason: Secondary | ICD-10-CM

## 2018-06-24 MED ORDER — OXYBUTYNIN CHLORIDE ER 5 MG PO TB24: 5.0000 mg | ORAL_TABLET | Freq: Every day | ORAL | 1 refills | Status: DC

## 2018-06-24 NOTE — Progress Notes (Signed)
Virtual Visit via Video Note  I connected with Katherine Costa   on 06/24/18 at  9:00 AM EDT by a video enabled telemedicine application and verified that I am speaking with the correct person using two identifiers.  Location patient: home Location provider:work office Persons participating in the virtual visit: patient, provider  I discussed the limitations of evaluation and management by telemedicine and the availability of in person appointments. The patient expressed understanding and agreed to proceed.   Katherine Costa DOB: 05/23/1950 Encounter date: 06/24/2018  This is a 68 y.o. female who presents to establish care. Chief Complaint  Patient presents with  . Establish Care    History of present illness: No specific concerns that she has today. Insulin cost is extremely high.  Not qualified for financial assistance through pharmaceutical companies.  HTN: lisinopril-hctz She does check at home frequently. 163/78 with HR 78. Hasn't taken medication today. This is higher than she usually gets. (repeat 152/74).   GERD: prilosec 40mg  daily  DMI: follows with Dr. Loanne Drilling. Last appointment was 09/2017 and was due for follow up in Dec. lantus 100 units in morning. She is doing humalog 10 units in morning. She is doing 90 units of lantus now because she was getting low sugars when she did the 100units. Not using humalog with other meals in day. Sugars have been below 200 this month. Thinks problem when sugar is high is related to later night snacking. (ie will be mowing yards all day and then feels need for night time snack then sugar next morning will be higher). Does better when staying away from carbs.   Arthritis:Has had knee replacement and other knee bothers her as well. Right hand bothers her most. Squeezing can bother her. Has neck issue as well. Thinks that fall years ago has affected neck - ROM is somewhat limited.   HL: zocor 80mg . No issues with muscle aches/pains on this. Worse  with recent rain. Tylenol arthritis helps.   Due for mammogram? Last listed 01/2016; per patient this was completed Jan 2020.  Pap? (gyn provider listed Dr. Nori Riis; states done 2020) colonoscopy recommend repeat in 07/2020 due to polyps  Was taking myrbetriq which helped, but cost went up this month. Leaks with stress and with urge. Has been this way for about a year. In jan started the medication which helped, but couldn't afford this.    Past Medical History:  Diagnosis Date  . Arthritis   . ASYMPTOMATIC POSTMENOPAUSAL STATUS 05/21/2009  . DIABETES MELLITUS, TYPE I 08/24/2006  . Family history of colon cancer   . HERPES LABIALIS 03/13/2009  . HYPERCHOLESTEROLEMIA 02/09/2008  . Hyperlipidemia   . HYPERTENSION 08/24/2006  . Hypertension   . LIVER DISORDER 03/17/2010  . Tubulovillous adenoma of colon    2010    Past Surgical History:  Procedure Laterality Date  . ABDOMINAL HYSTERECTOMY     endometriosis; menorrhagia  . ACHILLES TENDON REPAIR  2010   right heel  . CHOLECYSTECTOMY  2001  . TOTAL KNEE ARTHROPLASTY Left 09/13/2015   Procedure: TOTAL KNEE ARTHROPLASTY;  Surgeon: Earlie Server, MD;  Location: [facility];  Service: Orthopedics;  Laterality: Left;   Allergies  Allergen Reactions  . Pioglitazone     REACTION: Edema   Current Meds  Medication Sig  . augmented betamethasone dipropionate (DIPROLENE) 0.05 % ointment Apply topically 3 (three) times daily as needed. rash  . CINNAMON PO Take 1 each by mouth daily.  . clotrimazole-betamethasone (LOTRISONE) cream APPLY TOPICALLY  THREE TIMES A DAY AS NEEDED FOR RASH.  . colestipol (MICRONIZED COLESTIPOL HCL) 1 g tablet Take 3 tablets (3 g total) by mouth daily.  Marland Kitchen glucose blood (ONE TOUCH ULTRA TEST) test strip 1 each by Other route 2 (two) times daily. And lancets 2/day 250.01  . Insulin Glargine (LANTUS SOLOSTAR) 100 UNIT/ML Solostar Pen Inject 100 Units into the skin every morning. (Patient taking differently: Inject 90 Units into the  skin every morning. )  . insulin lispro (HUMALOG KWIKPEN) 100 UNIT/ML KiwkPen Inject 0.1 mLs (10 Units total) into the skin daily with breakfast. And pen needles 2/day  . Insulin Pen Needle (CLICKFINE PEN NEEDLES) 31G X 6 MM MISC Use to check blood sugar 1 time per day.  . lisinopril-hydrochlorothiazide (PRINZIDE,ZESTORETIC) 10-12.5 MG tablet Take 1 tablet by mouth daily.  . Omega-3 1000 MG CAPS Take 1 capsule by mouth daily.  Marland Kitchen omeprazole (PRILOSEC) 40 MG capsule Take 1 capsule (40 mg total) by mouth daily.  . simvastatin (ZOCOR) 80 MG tablet Take 1 tablet (80 mg total) by mouth daily.   Social History   Tobacco Use  . Smoking status: Never Smoker  . Smokeless tobacco: Never Used  Substance Use Topics  . Alcohol use: No    Alcohol/week: 0.0 standard drinks   Family History  Problem Relation Age of Onset  . Cancer Mother        Colon Cancer, Pancreatic Cancer  . Colon cancer Mother   . Diabetes Sister   . Colon cancer Sister   . Cancer Sister        Breast Cancer  . Heart attack Father 39  . Diabetes Brother   . Heart disease Brother        related to agent orange  . Prostate cancer Maternal Grandfather   . Heart disease Paternal Grandmother   . Diabetes Brother      Review of Systems  Constitutional: Negative for chills, fatigue and fever.  Respiratory: Negative for cough, chest tightness, shortness of breath and wheezing.   Cardiovascular: Negative for chest pain, palpitations and leg swelling.  Genitourinary: Positive for urgency. Negative for difficulty urinating, dysuria and flank pain.    Objective:  There were no vitals taken for this visit.      BP Readings from Last 3 Encounters:  02/04/18 140/70  09/29/17 (!) 154/80  06/29/17 (!) 160/86   Wt Readings from Last 3 Encounters:  02/04/18 242 lb (109.8 kg)  09/29/17 243 lb 6.4 oz (110.4 kg)  06/29/17 248 lb 9.6 oz (112.8 kg)    EXAM:  GENERAL: alert, oriented, appears well and in no acute  distress  HEENT: atraumatic, conjunctiva clear, no obvious abnormalities on inspection of external nose and ears  NECK: normal movements of the head and neck  LUNGS: on inspection no signs of respiratory distress, breathing rate appears normal, no obvious gross SOB, gasping or wheezing  CV: no obvious cyanosis  MS: moves all visible extremities without noticeable abnormality  PSYCH/NEURO: pleasant and cooperative, no obvious depression or anxiety, speech and thought processing grossly intact  SKIN: no obvious abnormality skin  Assessment/Plan 1. Type 1 diabetes mellitus without complication [facility]) Following with endocrinology.  She is overdue for an appointment.  She is also overdue for lab work.  We will start with lab work.  She would prefer to get all of her medical care done by the same provider, but I think due to her type 1 diabetes status, uncontrolled diabetes that it is best  to have endocrinology on board to help with management.  I have encouraged her to reach out for follow-up specifically regarding concerns for medication cost. - Hemoglobin A1c; Future - Microalbumin / creatinine urine ratio; Future  2. Gastroesophageal reflux disease, esophagitis presence not specified Continue Prilosec.  Controlled.  3. Essential hypertension Continue current medication.  We will bring her into the office to recheck in person following lab work. - CBC with Differential/Platelet; Future - Comprehensive metabolic panel; Future  4. HYPERCHOLESTEROLEMIA 10 use Zocor.  Would consider alternate medication due to risk of does with current dose of Zocor. - Lipid panel; Future - TSH; Future  5. Mixed stress and urge urinary incontinence Well with Myrbetriq, but was too expensive.  Will give trial of Ditropan.  This is been followed up by her gynecologist. - oxybutynin (DITROPAN-XL) 5 MG 24 hr tablet; Take 1 tablet (5 mg total) by mouth at bedtime.  Dispense: 90 tablet; Refill: 1 -  Urinalysis - Culture, Urine; Future  6. Non-adherence to medical treatment This diagnosis relates to difficulty being able to afford medication, specifically insulin.  Unfortunately, due to her insurance, she did not qualify for assistance through [employer]  Encouraged her to reach back out to endocrinology to see what else they would suggest at this point.  She has, in the past, not qualified for assistance through [facility] either, but cannot continue to pay for insulin at present cost.  Does not seem to be a better tiered insulin on her formulary from my review. - AMB Referral to [facility]    Return pending bloodwork.   I discussed the assessment and treatment plan with the patient. The patient was provided an opportunity to ask questions and all were answered. The patient agreed with the plan and demonstrated an understanding of the instructions.   The patient was advised to call back or seek an in-person evaluation if the symptoms worsen or if the condition fails to improve as anticipated.  I provided 32 minutes of non-face-to-face time during this encounter.   Micheline Rough, MD

## 2018-06-28 ENCOUNTER — Telehealth: Payer: Self-pay | Admitting: *Deleted

## 2018-06-28 NOTE — Telephone Encounter (Signed)
I called the pt and scheduled a lab appt for tomorrow.  I also informed the pt the referral was denied and to contact Dr Cordelia Pen office and she agreed.

## 2018-06-28 NOTE — Telephone Encounter (Signed)
I left a message for the pt to return my call. 

## 2018-06-28 NOTE — Telephone Encounter (Signed)
-----   Message from [name], MD sent at 06/24/2018 11:01 AM EDT ----- She is due for bloodwork now. We can schedule follow up once she completes this. Let her know referral for THn placed. Hopefully she will hear within week.

## 2018-06-29 ENCOUNTER — Other Ambulatory Visit: Payer: Self-pay

## 2018-06-29 ENCOUNTER — Other Ambulatory Visit (INDEPENDENT_AMBULATORY_CARE_PROVIDER_SITE_OTHER): Payer: Medicare Other

## 2018-06-29 DIAGNOSIS — N3946 Mixed incontinence: Secondary | ICD-10-CM

## 2018-06-29 DIAGNOSIS — E109 Type 1 diabetes mellitus without complications: Secondary | ICD-10-CM

## 2018-06-29 DIAGNOSIS — E7800 Pure hypercholesterolemia, unspecified: Secondary | ICD-10-CM

## 2018-06-29 DIAGNOSIS — I10 Essential (primary) hypertension: Secondary | ICD-10-CM | POA: Diagnosis not present

## 2018-06-29 LAB — COMPREHENSIVE METABOLIC PANEL
ALT: 18 U/L (ref 0–35)
AST: 17 U/L (ref 0–37)
Albumin: 3.5 g/dL (ref 3.5–5.2)
Alkaline Phosphatase: 86 U/L (ref 39–117)
BUN: 16 mg/dL (ref 6–23)
CO2: 28 mEq/L (ref 19–32)
Calcium: 9.2 mg/dL (ref 8.4–10.5)
Chloride: 102 mEq/L (ref 96–112)
Creatinine, Ser: 0.77 mg/dL (ref 0.40–1.20)
GFR: 75.04 mL/min (ref >60.00)
Glucose, Bld: 137 mg/dL — ABNORMAL HIGH (ref 70–99)
Potassium: 4.6 mEq/L (ref 3.5–5.1)
Sodium: 137 mEq/L (ref 135–145)
Total Bilirubin: 0.8 mg/dL (ref 0.2–1.2)
Total Protein: 6.2 g/dL (ref 6.0–8.3)

## 2018-06-29 LAB — LIPID PANEL
Cholesterol: 261 mg/dL — ABNORMAL HIGH (ref 0–200)
HDL: 79.3 mg/dL (ref >39.00)
LDL Cholesterol: 159 mg/dL — ABNORMAL HIGH (ref 0–99)
NonHDL: 181.64
Total CHOL/HDL Ratio: 3
Triglycerides: 112 mg/dL (ref 0.0–149.0)
VLDL: 22.4 mg/dL (ref 0.0–40.0)

## 2018-06-29 LAB — CBC WITH DIFFERENTIAL/PLATELET
Basophils Absolute: 0.1 10*3/uL (ref 0.0–0.1)
Basophils Relative: 1.1 % (ref 0.0–3.0)
Eosinophils Absolute: 0.2 10*3/uL (ref 0.0–0.7)
Eosinophils Relative: 3.3 % (ref 0.0–5.0)
HCT: 43.5 % (ref 36.0–46.0)
Hemoglobin: 15 g/dL (ref 12.0–15.0)
Lymphocytes Relative: 21.1 % (ref 12.0–46.0)
Lymphs Abs: 1.1 10*3/uL (ref 0.7–4.0)
MCHC: 34.5 g/dL (ref 30.0–36.0)
MCV: 88.2 fl (ref 78.0–100.0)
Monocytes Absolute: 0.5 10*3/uL (ref 0.1–1.0)
Monocytes Relative: 9.6 % (ref 3.0–12.0)
Neutro Abs: 3.5 10*3/uL (ref 1.4–7.7)
Neutrophils Relative %: 64.9 % (ref 43.0–77.0)
Platelets: 271 10*3/uL (ref 150.0–400.0)
RBC: 4.93 Mil/uL (ref 3.87–5.11)
RDW: 13.6 % (ref 11.5–15.5)
WBC: 5.4 10*3/uL (ref 4.0–10.5)

## 2018-06-29 LAB — HEMOGLOBIN A1C: Hgb A1c MFr Bld: 10.3 % — ABNORMAL HIGH (ref 4.6–6.5)

## 2018-06-29 LAB — MICROALBUMIN / CREATININE URINE RATIO
Creatinine,U: 149.8 mg/dL
Microalb Creat Ratio: 0.5 mg/g (ref 0.0–30.0)
Microalb, Ur: <0.7 mg/dL (ref 0.0–1.9)

## 2018-06-29 LAB — TSH: TSH: 1.31 u[IU]/mL (ref 0.35–4.50)

## 2018-06-30 LAB — URINE CULTURE
MICRO NUMBER:: 533113
SPECIMEN QUALITY:: ADEQUATE

## 2018-07-04 ENCOUNTER — Other Ambulatory Visit: Payer: Self-pay

## 2018-07-04 NOTE — Patient Outreach (Signed)
[facility]) Care Management  07/04/2018  Katherine Costa Aug 20, 1950 [id number]   Medication Adherence call to Katherine Costa Compliant Voice message left with a call back number. Katherine Costa is showing past due on Simvastatin 80 mg under [facility].   [facility] Direct Dial [phone]  Fax [phone] [email or web address]

## 2018-09-06 ENCOUNTER — Encounter: Payer: Self-pay | Admitting: Family Medicine

## 2018-09-19 ENCOUNTER — Encounter: Payer: Self-pay | Admitting: Family Medicine

## 2018-09-21 ENCOUNTER — Other Ambulatory Visit: Payer: Self-pay | Admitting: Family Medicine

## 2018-09-21 MED ORDER — OMEPRAZOLE 40 MG PO CPDR: 40.0000 mg | DELAYED_RELEASE_CAPSULE | Freq: Every day | ORAL | 1 refills | Status: DC

## 2018-10-14 ENCOUNTER — Ambulatory Visit (INDEPENDENT_AMBULATORY_CARE_PROVIDER_SITE_OTHER): Payer: Medicare Other

## 2018-10-14 ENCOUNTER — Other Ambulatory Visit: Payer: Self-pay

## 2018-10-14 DIAGNOSIS — Z23 Encounter for immunization: Secondary | ICD-10-CM | POA: Diagnosis not present

## 2018-10-14 NOTE — Progress Notes (Signed)
Per orders of Dr. Loanne Drilling injection of HD Flu vaccine given IM in L deltoid today by A. Markesia Crilly, LPN . Denies any adverse reactions or discomfort.

## 2018-10-25 ENCOUNTER — Other Ambulatory Visit: Payer: Self-pay

## 2018-10-25 ENCOUNTER — Telehealth: Payer: Self-pay | Admitting: Endocrinology

## 2018-10-25 ENCOUNTER — Other Ambulatory Visit: Payer: Self-pay | Admitting: Endocrinology

## 2018-10-25 DIAGNOSIS — E109 Type 1 diabetes mellitus without complications: Secondary | ICD-10-CM

## 2018-10-25 MED ORDER — LANTUS SOLOSTAR 100 UNIT/ML ~~LOC~~ SOPN: 100.0000 [IU] | PEN_INJECTOR | SUBCUTANEOUS | 0 refills | Status: DC

## 2018-10-25 NOTE — Telephone Encounter (Signed)
1.  Please schedule f/u appt 2.  Then please refill x 1, pending that appt.  

## 2018-10-25 NOTE — Telephone Encounter (Signed)
Insulin Glargine (LANTUS SOLOSTAR) 100 UNIT/ML Solostar Pen 30 mL 0 10/25/2018    Sig - Route: Inject 100 Units into the skin every morning. WILL ONLY PROVIDE 30 DAY SUPPLY - Subcutaneous   Sent to pharmacy as: Insulin Glargine (LANTUS SOLOSTAR) 100 UNIT/ML Solostar Pen   E-Prescribing Status: Receipt confirmed by pharmacy (10/25/2018 12:03 PM EDT)

## 2018-10-25 NOTE — Telephone Encounter (Signed)
LOV 09/29/17. Per your office note, pt was advised to f/u 3 MONTHS. No future appt noted. Please advise.

## 2018-10-25 NOTE — Telephone Encounter (Signed)
Per Dr. Ellison's request, I am forwarding you this refill request. Please review and refill if appropriate 

## 2018-10-25 NOTE — Telephone Encounter (Signed)
Please advise 

## 2018-10-25 NOTE — Telephone Encounter (Signed)
Appointment scheduled for 10/31/2018

## 2018-10-25 NOTE — Telephone Encounter (Signed)
Per Dr. Loanne Drilling, unable to refill Lantus without an appt. Routing this message to the front desk for scheduling purposes.

## 2018-10-25 NOTE — Telephone Encounter (Signed)
LVM to call back and schedule appointment.

## 2018-10-25 NOTE — Telephone Encounter (Signed)
Please forward refill request to pt's primary care provider.   

## 2018-10-26 ENCOUNTER — Encounter: Payer: Self-pay | Admitting: Family Medicine

## 2018-10-26 ENCOUNTER — Ambulatory Visit (INDEPENDENT_AMBULATORY_CARE_PROVIDER_SITE_OTHER): Payer: Medicare Other | Admitting: Family Medicine

## 2018-10-26 ENCOUNTER — Other Ambulatory Visit: Payer: Self-pay

## 2018-10-26 VITALS — BP 130/64 | HR 77 | Temp 97.8°F | Wt 239.8 lb

## 2018-10-26 DIAGNOSIS — M545 Low back pain, unspecified: Secondary | ICD-10-CM

## 2018-10-26 DIAGNOSIS — N760 Acute vaginitis: Secondary | ICD-10-CM

## 2018-10-26 DIAGNOSIS — R350 Frequency of micturition: Secondary | ICD-10-CM | POA: Diagnosis not present

## 2018-10-26 LAB — POCT URINALYSIS DIPSTICK
Bilirubin, UA: NEGATIVE
Blood, UA: NEGATIVE
Glucose, UA: POSITIVE — AB
Ketones, UA: NEGATIVE
Leukocytes, UA: NEGATIVE
Nitrite, UA: NEGATIVE
Protein, UA: NEGATIVE
Spec Grav, UA: 1.01 (ref 1.010–1.025)
Urobilinogen, UA: 0.2 E.U./dL
pH, UA: 6.5 (ref 5.0–8.0)

## 2018-10-26 MED ORDER — FLUCONAZOLE 150 MG PO TABS: ORAL_TABLET | ORAL | 0 refills | Status: DC

## 2018-10-26 MED ORDER — CYCLOBENZAPRINE HCL 5 MG PO TABS: 5.0000 mg | ORAL_TABLET | Freq: Three times a day (TID) | ORAL | 1 refills | Status: DC | PRN

## 2018-10-26 NOTE — Patient Instructions (Signed)
Acute Back Pain, Adult Acute back pain is sudden and usually short-lived. It is often caused by an injury to the muscles and tissues in the back. The injury may result from:  A muscle or ligament getting overstretched or torn (strained). Ligaments are tissues that connect bones to each other. Lifting something improperly can cause a back strain.  Wear and tear (degeneration) of the spinal disks. Spinal disks are circular tissue that provides cushioning between the bones of the spine (vertebrae).  Twisting motions, such as while playing sports or doing yard work.  A hit to the back.  Arthritis. You may have a physical exam, lab tests, and imaging tests to find the cause of your pain. Acute back pain usually goes away with rest and home care. Follow these instructions at home: Managing pain, stiffness, and swelling  Take over-the-counter and prescription medicines only as told by your health care provider.  Your health care provider may recommend applying ice during the first 24-48 hours after your pain starts. To do this: ? Put ice in a plastic bag. ? Place a towel between your skin and the bag. ? Leave the ice on for 20 minutes, 2-3 times a day.  If directed, apply heat to the affected area as often as told by your health care provider. Use the heat source that your health care provider recommends, such as a moist heat pack or a heating pad. ? Place a towel between your skin and the heat source. ? Leave the heat on for 20-30 minutes. ? Remove the heat if your skin turns bright red. This is especially important if you are unable to feel pain, heat, or cold. You have a greater risk of getting burned. Activity   Do not stay in bed. Staying in bed for more than 1-2 days can delay your recovery.  Sit up and stand up straight. Avoid leaning forward when you sit, or hunching over when you stand. ? If you work at a desk, sit close to it so you do not need to lean over. Keep your chin tucked  in. Keep your neck drawn back, and keep your elbows bent at a right angle. Your arms should look like the letter "L." ? Sit high and close to the steering wheel when you drive. Add lower back (lumbar) support to your car seat, if needed.  Take short walks on even surfaces as soon as you are able. Try to increase the length of time you walk each day.  Do not sit, drive, or stand in one place for more than 30 minutes at a time. Sitting or standing for long periods of time can put stress on your back.  Do not drive or use heavy machinery while taking prescription pain medicine.  Use proper lifting techniques. When you bend and lift, use positions that put less stress on your back: ? Bend your knees. ? Keep the load close to your body. ? Avoid twisting.  Exercise regularly as told by your health care provider. Exercising helps your back heal faster and helps prevent back injuries by keeping muscles strong and flexible.  Work with a physical therapist to make a safe exercise program, as recommended by your health care provider. Do any exercises as told by your physical therapist. Lifestyle  Maintain a healthy weight. Extra weight puts stress on your back and makes it difficult to have good posture.  Avoid activities or situations that make you feel anxious or stressed. Stress and anxiety increase muscle   tension and can make back pain worse. Learn ways to manage anxiety and stress, such as through exercise. General instructions  Sleep on a firm mattress in a comfortable position. Try lying on your side with your knees slightly bent. If you lie on your back, put a pillow under your knees.  Follow your treatment plan as told by your health care provider. This may include: ? Cognitive or behavioral therapy. ? Acupuncture or massage therapy. ? Meditation or yoga. Contact a health care provider if:  You have pain that is not relieved with rest or medicine.  You have increasing pain going down  into your legs or buttocks.  Your pain does not improve after 2 weeks.  You have pain at night.  You lose weight without trying.  You have a fever or chills. Get help right away if:  You develop new bowel or bladder control problems.  You have unusual weakness or numbness in your arms or legs.  You develop nausea or vomiting.  You develop abdominal pain.  You feel faint. Summary  Acute back pain is sudden and usually short-lived.  Use proper lifting techniques. When you bend and lift, use positions that put less stress on your back.  Take over-the-counter and prescription medicines and apply heat or ice as directed by your health care provider. This information is not intended to replace advice given to you by your health care provider. Make sure you discuss any questions you have with your health care provider. Document Released: 01/12/2005 Document Revised: 05/03/2018 Document Reviewed: 08/26/2016 Elsevier Patient Education  [place].  Consider over the counter lidocaine patch for back pain.

## 2018-10-26 NOTE — Telephone Encounter (Signed)
I called the pt and scheduled an appt for today at 2:45pm. 

## 2018-10-26 NOTE — Progress Notes (Signed)
Subjective:     Patient ID: Katherine Costa, female   DOB: 02-May-1950, 68 y.o.   MRN: [id number]  HPI Patient is seen with the following concerns  She has had some urine frequency over the past week.  No burning with urination.  She was concerned about possible UTI.  She has had about 2-week history of right-sided lower back pain.  Denies any recent history of UTI.  No fevers or chills.  Back pain is right upper lumbar area.  Worse with movement.  Worsening in severity.  No known injury.  No radiation.  Mostly achy quality and occasionally sharp and consistently worse with movement.  She is taking some Voltaren which helps somewhat.  She does have type 2 diabetes with poor control with most recent A1c 10.3%.  Followed by endocrinologist.  She complains of some vaginal irritation.  She is had some redness and itching.  Past history of yeast vaginitis.  No discharge.  Past Medical History:  Diagnosis Date  . Arthritis   . ASYMPTOMATIC POSTMENOPAUSAL STATUS 05/21/2009  . DIABETES MELLITUS, TYPE I 08/24/2006  . Family history of colon cancer   . HERPES LABIALIS 03/13/2009  . HYPERCHOLESTEROLEMIA 02/09/2008  . Hyperlipidemia   . HYPERTENSION 08/24/2006  . Hypertension   . LIVER DISORDER 03/17/2010  . Tubulovillous adenoma of colon    2010    Past Surgical History:  Procedure Laterality Date  . ABDOMINAL HYSTERECTOMY     endometriosis; menorrhagia  . ACHILLES TENDON REPAIR  2010   right heel  . CHOLECYSTECTOMY  2001  . TOTAL KNEE ARTHROPLASTY Left 09/13/2015   Procedure: TOTAL KNEE ARTHROPLASTY;  Surgeon: Earlie Server, MD;  Location: [facility];  Service: Orthopedics;  Laterality: Left;    reports that she has never smoked. She has never used smokeless tobacco. She reports that she does not drink alcohol or use drugs. family history includes Cancer in her mother and sister; Colon cancer in her mother and sister; Diabetes in her brother, brother, and sister; Heart attack (age of onset: 59) in  her father; Heart disease in her brother and paternal grandmother; Prostate cancer in her maternal grandfather. Allergies  Allergen Reactions  . Pioglitazone     REACTION: Edema     Review of Systems  Constitutional: Negative for appetite change, chills, fever and unexpected weight change.  Gastrointestinal: Negative for abdominal pain, constipation, diarrhea, nausea and vomiting.  Genitourinary: Positive for frequency. Negative for dysuria, vaginal bleeding and vaginal discharge.  Musculoskeletal: Positive for back pain.  Neurological: Negative for dizziness, weakness and numbness.       Objective:   Physical Exam Constitutional:      Appearance: Normal appearance.  Cardiovascular:     Rate and Rhythm: Normal rate and regular rhythm.  Pulmonary:     Effort: Pulmonary effort is normal.     Breath sounds: Normal breath sounds.  Musculoskeletal:     Right lower leg: No edema.     Left lower leg: No edema.     Comments: Straight leg raises are negative. She has some tenderness right upper to mid lumbar region just right of the spine.  Palpable muscle tension in this region  Neurological:     Mental Status: She is alert.        Assessment:     #1 urine frequency.  No dysuria.  Urine dipstick reveals glucose otherwise negative.  No evidence for infection  #2 right lower lumbar back pain.  Suspect musculoskeletal.  This has been  consistently worse with movement.  May have some secondary muscle spasm  #3 vaginal irritation.  Similar symptoms with yeast vaginitis in the past.  High risk with poorly controlled diabetes    Plan:     -Urine dipstick results as above -Recommend conservative treatment of back pain with heat, muscle massage, low-dose Flexeril 5 mg nightly.  Consider physical therapy if not improving over the next week or 2 -Fluconazole 150 mg x 1 dose for vaginitis symptoms  Eulas Post MD  Primary Care at [facility]

## 2018-10-27 ENCOUNTER — Other Ambulatory Visit: Payer: Self-pay

## 2018-10-31 ENCOUNTER — Ambulatory Visit (INDEPENDENT_AMBULATORY_CARE_PROVIDER_SITE_OTHER): Payer: Medicare Other | Admitting: Endocrinology

## 2018-10-31 ENCOUNTER — Other Ambulatory Visit: Payer: Self-pay

## 2018-10-31 ENCOUNTER — Encounter: Payer: Self-pay | Admitting: Endocrinology

## 2018-10-31 VITALS — BP 142/80 | HR 83 | Ht 64.0 in | Wt 238.8 lb

## 2018-10-31 DIAGNOSIS — E109 Type 1 diabetes mellitus without complications: Secondary | ICD-10-CM | POA: Diagnosis not present

## 2018-10-31 DIAGNOSIS — M549 Dorsalgia, unspecified: Secondary | ICD-10-CM

## 2018-10-31 LAB — POCT GLYCOSYLATED HEMOGLOBIN (HGB A1C): Hemoglobin A1C: 9.8 % — AB (ref 4.0–5.6)

## 2018-10-31 MED ORDER — INSULIN LISPRO (1 UNIT DIAL) 100 UNIT/ML (KWIKPEN): 10.0000 [IU] | PEN_INJECTOR | Freq: Every day | SUBCUTANEOUS | 11 refills | Status: DC

## 2018-10-31 MED ORDER — LANTUS SOLOSTAR 100 UNIT/ML ~~LOC~~ SOPN: 100.0000 [IU] | PEN_INJECTOR | SUBCUTANEOUS | 11 refills | Status: DC

## 2018-10-31 NOTE — Progress Notes (Addendum)
Subjective:    Patient ID: Katherine Costa, female    DOB: 1950-04-12, 68 y.o.   MRN: [id number]  HPI Pt returns for f/u of diabetes mellitus: DM type: 1 Dx'ed: 99991111 Complications: none Therapy: insulin since 1997.  GDM: never DKA: once (1997) Severe hypoglycemia: never.  Pancreatitis: never.  Other: she declines weight-loss surgery; in 2015, she was changed to qd insulin, as she could not remember multiple daily injections; she works 1st shift at [employer].   Interval history: She says she never misses the insulin, but she takes Lantus, just 92 units qam, and humalog 10 units with breakfast.  no cbg record, but she says cbg's vary from 140-280.  pt states she feels well in general. She had a steroid injection into the lower back last week.   Past Medical History:  Diagnosis Date  . Arthritis   . ASYMPTOMATIC POSTMENOPAUSAL STATUS 05/21/2009  . DIABETES MELLITUS, TYPE I 08/24/2006  . Family history of colon cancer   . HERPES LABIALIS 03/13/2009  . HYPERCHOLESTEROLEMIA 02/09/2008  . Hyperlipidemia   . HYPERTENSION 08/24/2006  . Hypertension   . LIVER DISORDER 03/17/2010  . Tubulovillous adenoma of colon    2010     Past Surgical History:  Procedure Laterality Date  . ABDOMINAL HYSTERECTOMY     endometriosis; menorrhagia  . ACHILLES TENDON REPAIR  2010   right heel  . CHOLECYSTECTOMY  2001  . TOTAL KNEE ARTHROPLASTY Left 09/13/2015   Procedure: TOTAL KNEE ARTHROPLASTY;  Surgeon: Earlie Server, MD;  Location: [facility];  Service: Orthopedics;  Laterality: Left;    Social History   Socioeconomic History  . Marital status: Married    Spouse name: Not on file  . Number of children: 3  . Years of education: Not on file  . Highest education level: Not on file  Occupational History  . Occupation: Teacher, music  Social Needs  . Financial resource strain: Not on file  . Food insecurity    Worry: Not on file    Inability: Not on file  . Transportation needs     Medical: Not on file    Non-medical: Not on file  Tobacco Use  . Smoking status: Never Smoker  . Smokeless tobacco: Never Used  Substance and Sexual Activity  . Alcohol use: No    Alcohol/week: 0.0 standard drinks  . Drug use: No  . Sexual activity: Not on file  Lifestyle  . Physical activity    Days per week: Not on file    Minutes per session: Not on file  . Stress: Not on file  Relationships  . Social Herbalist on phone: Not on file    Gets together: Not on file    Attends religious service: Not on file    Active member of club or organization: Not on file    Attends meetings of clubs or organizations: Not on file    Relationship status: Not on file  . Intimate partner violence    Fear of current or ex partner: Not on file    Emotionally abused: Not on file    Physically abused: Not on file    Forced sexual activity: Not on file  Other Topics Concern  . Not on file  Social History Narrative   PT does not get regular exercise   3 girls    Current Outpatient Medications on File Prior to Visit  Medication Sig Dispense Refill  . augmented betamethasone dipropionate (DIPROLENE)  0.05 % ointment Apply topically 3 (three) times daily as needed. rash 50 g 2  . CINNAMON PO Take 1 each by mouth daily.    . clotrimazole-betamethasone (LOTRISONE) cream APPLY TOPICALLY THREE TIMES A DAY AS NEEDED FOR RASH. 1 g 0  . colestipol (MICRONIZED COLESTIPOL HCL) 1 g tablet Take 3 tablets (3 g total) by mouth daily. 270 tablet 2  . cyclobenzaprine (FLEXERIL) 5 MG tablet Take 1 tablet (5 mg total) by mouth 3 (three) times daily as needed for muscle spasms. 20 tablet 1  . glucose blood (ONE TOUCH ULTRA TEST) test strip 1 each by Other route 2 (two) times daily. And lancets 2/day 250.01 180 each 3  . Insulin Pen Needle (CLICKFINE PEN NEEDLES) 31G X 6 MM MISC Use to check blood sugar 1 time per day. 100 each 2  . lisinopril-hydrochlorothiazide (ZESTORETIC) 10-12.5 MG tablet TAKE ONE  TABLET EVERY DAY 90 tablet 3  . Omega-3 1000 MG CAPS Take 1 capsule by mouth daily.    Marland Kitchen omeprazole (PRILOSEC) 40 MG capsule Take 1 capsule (40 mg total) by mouth daily. 90 capsule 1  . oxybutynin (DITROPAN-XL) 5 MG 24 hr tablet Take 1 tablet (5 mg total) by mouth at bedtime. 90 tablet 1  . simvastatin (ZOCOR) 80 MG tablet Take 1 tablet (80 mg total) by mouth daily. 90 tablet 3   No current facility-administered medications on file prior to visit.     Allergies  Allergen Reactions  . Pioglitazone     REACTION: Edema    Family History  Problem Relation Age of Onset  . Cancer Mother        Colon Cancer, Pancreatic Cancer  . Colon cancer Mother   . Diabetes Sister   . Colon cancer Sister   . Cancer Sister        Breast Cancer  . Heart attack Father 39  . Diabetes Brother   . Heart disease Brother        related to agent orange  . Prostate cancer Maternal Grandfather   . Heart disease Paternal Grandmother   . Diabetes Brother     BP (!) 142/80 (BP Location: Left Arm, Patient Position: Sitting, Cuff Size: Large)   Pulse 83   Ht 5\' 4"  (1.626 m)   Wt 238 lb 12.8 oz (108.3 kg)   SpO2 99%   BMI 40.99 kg/m    Review of Systems She denies hypoglycemia.      Objective:   Physical Exam VITAL SIGNS:  See vs page GENERAL: no distress Pulses: dorsalis pedis intact bilat.   MSK: no deformity of the feet CV: trace bilat leg edema.   Skin:  no ulcer on the feet.  normal color and temp on the feet.  Neuro: sensation is intact to touch on the feet.    Lab Results  Component Value Date   HGBA1C 9.8 (A) 10/31/2018       Assessment & Plan:  Type 1 DM: she needs increased rx Back pain, worse: steroid injection is prob be affecting a1c Your blood pressure is high today.  Recheck next time.     Patient Instructions  I have sent a prescription to your pharmacy, to increase the Langtus to 100 units each morning. Please continue the same lispro check your blood sugar twice a  day.  vary the time of day when you check, between before the 3 meals, and at bedtime.  also check if you have symptoms of your blood sugar being  too high or too low.  please keep a record of the readings and bring it to your next appointment here (or you can bring the meter itself).  You can write it on any piece of paper.  please call us sooner if your blood sugar goes below 70, or if you have a lot of readings over 200.   Please come back for a follow-up appointment in 2 months.

## 2018-10-31 NOTE — Patient Instructions (Addendum)
I have sent a prescription to your pharmacy, to increase the Langtus to 100 units each morning. Please continue the same lispro check your blood sugar twice a day.  vary the time of day when you check, between before the 3 meals, and at bedtime.  also check if you have symptoms of your blood sugar being too high or too low.  please keep a record of the readings and bring it to your next appointment here (or you can bring the meter itself).  You can write it on any piece of paper.  please call us sooner if your blood sugar goes below 70, or if you have a lot of readings over 200.   Please come back for a follow-up appointment in 2 months.

## 2018-11-21 ENCOUNTER — Other Ambulatory Visit: Payer: Self-pay | Admitting: Endocrinology

## 2018-11-21 DIAGNOSIS — E109 Type 1 diabetes mellitus without complications: Secondary | ICD-10-CM

## 2018-12-26 ENCOUNTER — Ambulatory Visit (INDEPENDENT_AMBULATORY_CARE_PROVIDER_SITE_OTHER): Payer: Medicare Other | Admitting: Endocrinology

## 2018-12-26 ENCOUNTER — Encounter: Payer: Self-pay | Admitting: Endocrinology

## 2018-12-26 ENCOUNTER — Other Ambulatory Visit: Payer: Self-pay

## 2018-12-26 VITALS — BP 142/78 | HR 68 | Temp 98.1°F | Ht 64.0 in | Wt 244.6 lb

## 2018-12-26 DIAGNOSIS — E109 Type 1 diabetes mellitus without complications: Secondary | ICD-10-CM | POA: Diagnosis not present

## 2018-12-26 DIAGNOSIS — I10 Essential (primary) hypertension: Secondary | ICD-10-CM

## 2018-12-26 LAB — POCT GLYCOSYLATED HEMOGLOBIN (HGB A1C): Hemoglobin A1C: 9.7 % — AB (ref 4.0–5.6)

## 2018-12-26 MED ORDER — INSULIN NPH (HUMAN) (ISOPHANE) 100 UNIT/ML ~~LOC~~ SUSP: 100.0000 [IU] | SUBCUTANEOUS | 11 refills | Status: DC

## 2018-12-26 NOTE — Patient Instructions (Addendum)
Your blood pressure is high today.  Please see your primary care provider soon, to have it rechecked.   I have sent a prescription to your pharmacy, to change both current insulins to NPH, 100 units each morning.   check your blood sugar twice a day.  vary the time of day when you check, between before the 3 meals, and at bedtime.  also check if you have symptoms of your blood sugar being too high or too low.  please keep a record of the readings and bring it to your next appointment here (or you can bring the meter itself).  You can write it on any piece of paper.  please call us sooner if your blood sugar goes below 70, or if you have a lot of readings over 200.   Please come back for a follow-up appointment in 2 months.

## 2018-12-26 NOTE — Progress Notes (Signed)
Subjective:    Patient ID: Katherine Costa, female    DOB: 1950-05-07, 68 y.o.   MRN: [id number]  HPI Pt returns for f/u of diabetes mellitus: DM type: 1 Dx'ed: 99991111 Complications: none Therapy: insulin since 1997.  GDM: never DKA: once (1997) Severe hypoglycemia: never.  Pancreatitis: never.  Other: she declines weight-loss surgery; in 2015, she was changed to qd insulin, as she could not remember multiple daily injections; she is retired; fructosamine has converted to A1c 1% lower than a1c itself.  Interval history: She says she never misses the insulin.  she takes Lantus, 95 units qam, and humalog 10 units with breakfast.  no cbg record, but she says cbg's vary from 70-200.  pt states she feels well in general.  No recent steroids.  Past Medical History:  Diagnosis Date  . Arthritis   . ASYMPTOMATIC POSTMENOPAUSAL STATUS 05/21/2009  . DIABETES MELLITUS, TYPE I 08/24/2006  . Family history of colon cancer   . HERPES LABIALIS 03/13/2009  . HYPERCHOLESTEROLEMIA 02/09/2008  . Hyperlipidemia   . HYPERTENSION 08/24/2006  . Hypertension   . LIVER DISORDER 03/17/2010  . Tubulovillous adenoma of colon    2010     Past Surgical History:  Procedure Laterality Date  . ABDOMINAL HYSTERECTOMY     endometriosis; menorrhagia  . ACHILLES TENDON REPAIR  2010   right heel  . CHOLECYSTECTOMY  2001  . TOTAL KNEE ARTHROPLASTY Left 09/13/2015   Procedure: TOTAL KNEE ARTHROPLASTY;  Surgeon: Earlie Server, MD;  Location: [facility];  Service: Orthopedics;  Laterality: Left;    Social History   Socioeconomic History  . Marital status: Married    Spouse name: Not on file  . Number of children: 3  . Years of education: Not on file  . Highest education level: Not on file  Occupational History  . Occupation: Teacher, music  Social Needs  . Financial resource strain: Not on file  . Food insecurity    Worry: Not on file    Inability: Not on file  . Transportation needs    Medical: Not  on file    Non-medical: Not on file  Tobacco Use  . Smoking status: Never Smoker  . Smokeless tobacco: Never Used  Substance and Sexual Activity  . Alcohol use: No    Alcohol/week: 0.0 standard drinks  . Drug use: No  . Sexual activity: Not on file  Lifestyle  . Physical activity    Days per week: Not on file    Minutes per session: Not on file  . Stress: Not on file  Relationships  . Social Herbalist on phone: Not on file    Gets together: Not on file    Attends religious service: Not on file    Active member of club or organization: Not on file    Attends meetings of clubs or organizations: Not on file    Relationship status: Not on file  . Intimate partner violence    Fear of current or ex partner: Not on file    Emotionally abused: Not on file    Physically abused: Not on file    Forced sexual activity: Not on file  Other Topics Concern  . Not on file  Social History Narrative   PT does not get regular exercise   3 girls    Current Outpatient Medications on File Prior to Visit  Medication Sig Dispense Refill  . augmented betamethasone dipropionate (DIPROLENE) 0.05 % ointment Apply topically  3 (three) times daily as needed. rash 50 g 2  . CINNAMON PO Take 1 each by mouth daily.    . clotrimazole-betamethasone (LOTRISONE) cream APPLY TOPICALLY THREE TIMES A DAY AS NEEDED FOR RASH. 1 g 0  . colestipol (MICRONIZED COLESTIPOL HCL) 1 g tablet Take 3 tablets (3 g total) by mouth daily. 270 tablet 2  . cyclobenzaprine (FLEXERIL) 5 MG tablet Take 1 tablet (5 mg total) by mouth 3 (three) times daily as needed for muscle spasms. 20 tablet 1  . glucose blood (ONE TOUCH ULTRA TEST) test strip 1 each by Other route 2 (two) times daily. And lancets 2/day 250.01 180 each 3  . Insulin Pen Needle (CLICKFINE PEN NEEDLES) 31G X 6 MM MISC Use to check blood sugar 1 time per day. 100 each 2  . lisinopril-hydrochlorothiazide (ZESTORETIC) 10-12.5 MG tablet TAKE ONE TABLET EVERY DAY  90 tablet 3  . Omega-3 1000 MG CAPS Take 1 capsule by mouth daily.    Marland Kitchen omeprazole (PRILOSEC) 40 MG capsule Take 1 capsule (40 mg total) by mouth daily. 90 capsule 1  . oxybutynin (DITROPAN-XL) 5 MG 24 hr tablet Take 1 tablet (5 mg total) by mouth at bedtime. 90 tablet 1  . simvastatin (ZOCOR) 80 MG tablet Take 1 tablet (80 mg total) by mouth daily. 90 tablet 3   No current facility-administered medications on file prior to visit.     Allergies  Allergen Reactions  . Pioglitazone     REACTION: Edema    Family History  Problem Relation Age of Onset  . Cancer Mother        Colon Cancer, Pancreatic Cancer  . Colon cancer Mother   . Diabetes Sister   . Colon cancer Sister   . Cancer Sister        Breast Cancer  . Heart attack Father 69  . Diabetes Brother   . Heart disease Brother        related to agent orange  . Prostate cancer Maternal Grandfather   . Heart disease Paternal Grandmother   . Diabetes Brother     BP (!) 142/78 (BP Location: Right Arm, Patient Position: Sitting, Cuff Size: Large)   Pulse 68   Temp 98.1 F (36.7 C)   Ht 5\' 4"  (1.626 m)   Wt 244 lb 9.6 oz (110.9 kg)   SpO2 97%   BMI 41.99 kg/m    Review of Systems Denies LOC.     Objective:   Physical Exam VITAL SIGNS:  See vs page GENERAL: no distress Pulses: dorsalis pedis intact bilat.   MSK: no deformity of the feet CV: no leg edema Skin:  no ulcer on the feet.  normal color and temp on the feet.  Neuro: sensation is intact to touch on the feet.   Ext: there is bilateral onychomycosis of the toenails, and bilat ingrown toenails.     A1c=9.7%    Assessment & Plan:  HTN: is noted today.  Type 1 DM: she needs increased rx.  Hypoglycemia: Based on the pattern of her cbg's, she needs some adjustment in her therapy.    Patient Instructions  Your blood pressure is high today.  Please see your primary care provider soon, to have it rechecked.   I have sent a prescription to your pharmacy, to  change both current insulins to NPH, 100 units each morning.   check your blood sugar twice a day.  vary the time of day when you check, between before the  3 meals, and at bedtime.  also check if you have symptoms of your blood sugar being too high or too low.  please keep a record of the readings and bring it to your next appointment here (or you can bring the meter itself).  You can write it on any piece of paper.  please call us sooner if your blood sugar goes below 70, or if you have a lot of readings over 200.   Please come back for a follow-up appointment in 2 months.

## 2018-12-27 NOTE — Progress Notes (Signed)
Patient needs blood pressure follow up visit. If she is able to check at home virtual would be ok.

## 2018-12-29 ENCOUNTER — Other Ambulatory Visit: Payer: Self-pay

## 2018-12-29 DIAGNOSIS — E109 Type 1 diabetes mellitus without complications: Secondary | ICD-10-CM

## 2018-12-29 MED ORDER — INSULIN NPH (HUMAN) (ISOPHANE) 100 UNIT/ML ~~LOC~~ SUSP: 100.0000 [IU] | SUBCUTANEOUS | 11 refills | Status: DC

## 2019-02-22 ENCOUNTER — Other Ambulatory Visit: Payer: Self-pay

## 2019-02-24 ENCOUNTER — Encounter: Payer: Self-pay | Admitting: Endocrinology

## 2019-02-24 ENCOUNTER — Other Ambulatory Visit: Payer: Self-pay

## 2019-02-24 ENCOUNTER — Ambulatory Visit (INDEPENDENT_AMBULATORY_CARE_PROVIDER_SITE_OTHER): Payer: Medicare Other | Admitting: Endocrinology

## 2019-02-24 DIAGNOSIS — E109 Type 1 diabetes mellitus without complications: Secondary | ICD-10-CM

## 2019-02-24 NOTE — Progress Notes (Signed)
telehealth visit today via doxy video visit.  Alternatives to telehealth are presented to this patient, and the patient agrees to the telehealth visit. Pt is advised of the cost of the visit, and agrees to this, also.   Patient is at home, and I am at the office.   Persons attending the telehealth visit: the patient and I Pt returns for f/u of diabetes mellitus: DM type: 1 Dx'ed: 99991111 Complications: none Therapy: insulin since 1997.  GDM: never DKA: once (1997) Severe hypoglycemia: never.  Pancreatitis: never.  SDOH: she takes human insulin, due to cost. Other: she declines weight-loss surgery; in 2015, she was changed to qd insulin, as she could not remember multiple daily injections; she is retired; fructosamine has converted to A1c 1% lower than a1c itself.  Interval history: She takes NPH, 96 units qam.  She says cbg varies from 65-295.  It is in general higher as the day goes on.  She says syst BP is 151. Past Medical History:  Diagnosis Date  . Arthritis   . ASYMPTOMATIC POSTMENOPAUSAL STATUS 05/21/2009  . DIABETES MELLITUS, TYPE I 08/24/2006  . Family history of colon cancer   . HERPES LABIALIS 03/13/2009  . HYPERCHOLESTEROLEMIA 02/09/2008  . Hyperlipidemia   . HYPERTENSION 08/24/2006  . Hypertension   . LIVER DISORDER 03/17/2010  . Tubulovillous adenoma of colon    2010     Past Surgical History:  Procedure Laterality Date  . ABDOMINAL HYSTERECTOMY     endometriosis; menorrhagia  . ACHILLES TENDON REPAIR  2010   right heel  . CHOLECYSTECTOMY  2001  . TOTAL KNEE ARTHROPLASTY Left 09/13/2015   Procedure: TOTAL KNEE ARTHROPLASTY;  Surgeon: Earlie Server, MD;  Location: [facility];  Service: Orthopedics;  Laterality: Left;    Social History   Socioeconomic History  . Marital status: Married    Spouse name: Not on file  . Number of children: 3  . Years of education: Not on file  . Highest education level: Not on file  Occupational History  . Occupation: Diplomatic Services operational officer  Tobacco Use  . Smoking status: Never Smoker  . Smokeless tobacco: Never Used  Substance and Sexual Activity  . Alcohol use: No    Alcohol/week: 0.0 standard drinks  . Drug use: No  . Sexual activity: Not on file  Other Topics Concern  . Not on file  Social History Narrative   PT does not get regular exercise   3 girls   Social Determinants of Health   Financial Resource Strain:   . Difficulty of Paying Living Expenses: Not on file  Food Insecurity:   . Worried About Charity fundraiser in the Last Year: Not on file  . Ran Out of Food in the Last Year: Not on file  Transportation Needs:   . Lack of Transportation (Medical): Not on file  . Lack of Transportation (Non-Medical): Not on file  Physical Activity:   . Days of Exercise per Week: Not on file  . Minutes of Exercise per Session: Not on file  Stress:   . Feeling of Stress : Not on file  Social Connections:   . Frequency of Communication with Friends and Family: Not on file  . Frequency of Social Gatherings with Friends and Family: Not on file  . Attends Religious Services: Not on file  . Active Member of Clubs or Organizations: Not on file  . Attends Archivist Meetings: Not on file  . Marital Status: Not on file  Intimate Partner Violence:   . Fear of Current or Ex-Partner: Not on file  . Emotionally Abused: Not on file  . Physically Abused: Not on file  . Sexually Abused: Not on file    Current Outpatient Medications on File Prior to Visit  Medication Sig Dispense Refill  . augmented betamethasone dipropionate (DIPROLENE) 0.05 % ointment Apply topically 3 (three) times daily as needed. rash 50 g 2  . CINNAMON PO Take 1 each by mouth daily.    . clotrimazole-betamethasone (LOTRISONE) cream APPLY TOPICALLY THREE TIMES A DAY AS NEEDED FOR RASH. 1 g 0  . colestipol (MICRONIZED COLESTIPOL HCL) 1 g tablet Take 3 tablets (3 g total) by mouth daily. 270 tablet 2  . cyclobenzaprine (FLEXERIL) 5 MG  tablet Take 1 tablet (5 mg total) by mouth 3 (three) times daily as needed for muscle spasms. 20 tablet 1  . glucose blood (ONE TOUCH ULTRA TEST) test strip 1 each by Other route 2 (two) times daily. And lancets 2/day 250.01 180 each 3  . insulin NPH Human (HUMULIN N) 100 UNIT/ML injection Inject 1 mL (100 Units total) into the skin every morning. THIS IS NOT WRITTEN FOR BRAND NAME INSULIN. GIVE THE PATIENT THE GENERIC FORM OF INSULIN 30 mL 11  . Insulin Pen Needle (CLICKFINE PEN NEEDLES) 31G X 6 MM MISC Use to check blood sugar 1 time per day. 100 each 2  . lisinopril-hydrochlorothiazide (ZESTORETIC) 10-12.5 MG tablet TAKE ONE TABLET EVERY DAY 90 tablet 3  . Omega-3 1000 MG CAPS Take 1 capsule by mouth daily.    Marland Kitchen omeprazole (PRILOSEC) 40 MG capsule Take 1 capsule (40 mg total) by mouth daily. 90 capsule 1  . oxybutynin (DITROPAN-XL) 5 MG 24 hr tablet Take 1 tablet (5 mg total) by mouth at bedtime. 90 tablet 1  . simvastatin (ZOCOR) 80 MG tablet Take 1 tablet (80 mg total) by mouth daily. 90 tablet 3   No current facility-administered medications on file prior to visit.    Allergies  Allergen Reactions  . Pioglitazone     REACTION: Edema    Family History  Problem Relation Age of Onset  . Cancer Mother        Colon Cancer, Pancreatic Cancer  . Colon cancer Mother   . Diabetes Sister   . Colon cancer Sister   . Cancer Sister        Breast Cancer  . Heart attack Father 49  . Diabetes Brother   . Heart disease Brother        related to agent orange  . Prostate cancer Maternal Grandfather   . Heart disease Paternal Grandmother   . Diabetes Brother     There were no vitals taken for this visit.  ROS: She denies hypoglycemia.    I and P: Type 1 DM: uncertain glycemic control.  I ordered A1c, to be done at [facility] in [place].   Hypoglycemia: this limits aggressiveness of glycemic control.  HTN.     Patient Instructions  Your blood pressure is high today.  Please see your  primary care provider soon, to have it rechecked.   check your blood sugar twice a day.  vary the time of day when you check, between before the 3 meals, and at bedtime.  also check if you have symptoms of your blood sugar being too high or too low.  please keep a record of the readings and bring it to your next appointment here (or you can bring the  meter itself).  You can write it on any piece of paper.  please call us sooner if your blood sugar goes below 70, or if you have a lot of readings over 200.   If it is high, we'll change to 70/30.   Please come back for a follow-up appointment in 2 months.

## 2019-02-24 NOTE — Patient Instructions (Addendum)
Your blood pressure is high today.  Please see your primary care provider soon, to have it rechecked.   check your blood sugar twice a day.  vary the time of day when you check, between before the 3 meals, and at bedtime.  also check if you have symptoms of your blood sugar being too high or too low.  please keep a record of the readings and bring it to your next appointment here (or you can bring the meter itself).  You can write it on any piece of paper.  please call us sooner if your blood sugar goes below 70, or if you have a lot of readings over 200.   If it is high, we'll change to 70/30.   Please come back for a follow-up appointment in 2 months.

## 2019-02-28 ENCOUNTER — Other Ambulatory Visit: Payer: Self-pay | Admitting: Endocrinology

## 2019-02-28 LAB — HEMOGLOBIN A1C
Est. average glucose Bld gHb Est-mCnc: 272 mg/dL
Hgb A1c MFr Bld: 11.1 % — ABNORMAL HIGH (ref 4.8–5.6)

## 2019-02-28 MED ORDER — NOVOLIN 70/30 (70-30) 100 UNIT/ML ~~LOC~~ SUSP: 100.0000 [IU] | Freq: Every day | SUBCUTANEOUS | 11 refills | Status: DC

## 2019-03-06 LAB — HM MAMMOGRAPHY

## 2019-03-30 ENCOUNTER — Encounter: Payer: Self-pay | Admitting: Family Medicine

## 2019-04-17 ENCOUNTER — Other Ambulatory Visit: Payer: Self-pay | Admitting: Family Medicine

## 2019-06-29 ENCOUNTER — Other Ambulatory Visit: Payer: Self-pay | Admitting: Family Medicine

## 2019-06-29 DIAGNOSIS — N3946 Mixed incontinence: Secondary | ICD-10-CM

## 2019-07-05 ENCOUNTER — Other Ambulatory Visit: Payer: Self-pay | Admitting: Family Medicine

## 2019-07-05 DIAGNOSIS — Z205 Contact with and (suspected) exposure to viral hepatitis: Secondary | ICD-10-CM

## 2019-07-24 ENCOUNTER — Other Ambulatory Visit: Payer: Self-pay

## 2019-07-24 ENCOUNTER — Other Ambulatory Visit (INDEPENDENT_AMBULATORY_CARE_PROVIDER_SITE_OTHER): Payer: Medicare Other

## 2019-07-24 DIAGNOSIS — Z205 Contact with and (suspected) exposure to viral hepatitis: Secondary | ICD-10-CM | POA: Diagnosis not present

## 2019-07-25 LAB — HEPATITIS C ANTIBODY
Hepatitis C Ab: NONREACTIVE
SIGNAL TO CUT-OFF: 0.01 (ref <1.00)

## 2019-09-12 ENCOUNTER — Ambulatory Visit: Payer: Medicare Other | Admitting: Endocrinology

## 2019-09-12 ENCOUNTER — Encounter: Payer: Self-pay | Admitting: Endocrinology

## 2019-09-12 ENCOUNTER — Other Ambulatory Visit: Payer: Self-pay

## 2019-09-12 VITALS — BP 126/74 | HR 102 | Ht 64.0 in | Wt 247.2 lb

## 2019-09-12 DIAGNOSIS — E109 Type 1 diabetes mellitus without complications: Secondary | ICD-10-CM | POA: Diagnosis not present

## 2019-09-12 LAB — POCT GLYCOSYLATED HEMOGLOBIN (HGB A1C): Hemoglobin A1C: 10.1 % — AB (ref 4.0–5.6)

## 2019-09-12 MED ORDER — INSULIN NPH (HUMAN) (ISOPHANE) 100 UNIT/ML ~~LOC~~ SUSP: 110.0000 [IU] | SUBCUTANEOUS | 11 refills | Status: DC

## 2019-09-12 NOTE — Patient Instructions (Addendum)
Your blood pressure is high today.  Please see your primary care provider soon, to have it rechecked.   Please increase the insulin to 110 units each morning.   check your blood sugar twice a day.  vary the time of day when you check, between before the 3 meals, and at bedtime.  also check if you have symptoms of your blood sugar being too high or too low.  please keep a record of the readings and bring it to your next appointment here (or you can bring the meter itself).  You can write it on any piece of paper.  please call us sooner if your blood sugar goes below 70, or if you have a lot of readings over 200.     Please come back for a follow-up appointment in 2 months.

## 2019-09-12 NOTE — Progress Notes (Signed)
Subjective:    Patient ID: Katherine Costa, female    DOB: July 29, 1950, 69 y.o.   MRN: [id number]  HPI Pt returns for f/u of diabetes mellitus: DM type: 1 Dx'ed: 9323 Complications: none Therapy: insulin since 1997.  GDM: never DKA: once (1997) Severe hypoglycemia: never.  Pancreatitis: never.  SDOH: she takes human insulin, due to cost.  Other: she declines weight-loss surgery; in 2015, she was changed to qd insulin, as she could not remember multiple daily injections; she is retired; fructosamine has converted to A1c 1% lower than a1c itself.  Interval history: She takes NPH, 100 units qam.  She says cbg varies from 90-130.  There is no trend throughout the day.  dtr died recently.  Pt says she never misses the insulin.   Past Medical History:  Diagnosis Date  . Arthritis   . ASYMPTOMATIC POSTMENOPAUSAL STATUS 05/21/2009  . DIABETES MELLITUS, TYPE I 08/24/2006  . Family history of colon cancer   . HERPES LABIALIS 03/13/2009  . HYPERCHOLESTEROLEMIA 02/09/2008  . Hyperlipidemia   . HYPERTENSION 08/24/2006  . Hypertension   . LIVER DISORDER 03/17/2010  . Tubulovillous adenoma of colon    2010     Past Surgical History:  Procedure Laterality Date  . ABDOMINAL HYSTERECTOMY     endometriosis; menorrhagia  . ACHILLES TENDON REPAIR  2010   right heel  . CHOLECYSTECTOMY  2001  . TOTAL KNEE ARTHROPLASTY Left 09/13/2015   Procedure: TOTAL KNEE ARTHROPLASTY;  Surgeon: Earlie Server, MD;  Location: [place];  Service: Orthopedics;  Laterality: Left;    Social History   Socioeconomic History  . Marital status: Married    Spouse name: Not on file  . Number of children: 3  . Years of education: Not on file  . Highest education level: Not on file  Occupational History  . Occupation: Teacher, music  Tobacco Use  . Smoking status: Never Smoker  . Smokeless tobacco: Never Used  Substance and Sexual Activity  . Alcohol use: No    Alcohol/week: 0.0 standard drinks  . Drug  use: No  . Sexual activity: Not on file  Other Topics Concern  . Not on file  Social History Narrative   PT does not get regular exercise   3 girls   Social Determinants of Health   Financial Resource Strain:   . Difficulty of Paying Living Expenses: Not on file  Food Insecurity:   . Worried About Charity fundraiser in the Last Year: Not on file  . Ran Out of Food in the Last Year: Not on file  Transportation Needs:   . Lack of Transportation (Medical): Not on file  . Lack of Transportation (Non-Medical): Not on file  Physical Activity:   . Days of Exercise per Week: Not on file  . Minutes of Exercise per Session: Not on file  Stress:   . Feeling of Stress : Not on file  Social Connections:   . Frequency of Communication with Friends and Family: Not on file  . Frequency of Social Gatherings with Friends and Family: Not on file  . Attends Religious Services: Not on file  . Active Member of Clubs or Organizations: Not on file  . Attends Archivist Meetings: Not on file  . Marital Status: Not on file  Intimate Partner Violence:   . Fear of Current or Ex-Partner: Not on file  . Emotionally Abused: Not on file  . Physically Abused: Not on file  . Sexually Abused:  Not on file    Current Outpatient Medications on File Prior to Visit  Medication Sig Dispense Refill  . augmented betamethasone dipropionate (DIPROLENE) 0.05 % ointment Apply topically 3 (three) times daily as needed. rash 50 g 2  . CINNAMON PO Take 1 each by mouth daily.    . clotrimazole-betamethasone (LOTRISONE) cream APPLY TOPICALLY THREE TIMES A DAY AS NEEDED FOR RASH. 1 g 0  . colestipol (MICRONIZED COLESTIPOL HCL) 1 g tablet Take 3 tablets (3 g total) by mouth daily. 270 tablet 2  . cyclobenzaprine (FLEXERIL) 5 MG tablet Take 1 tablet (5 mg total) by mouth 3 (three) times daily as needed for muscle spasms. 20 tablet 1  . glucose blood (ONE TOUCH ULTRA TEST) test strip 1 each by Other route 2 (two)  times daily. And lancets 2/day 250.01 180 each 3  . insulin aspart (NOVOLOG) 100 UNIT/ML injection See admin instructions. Per sliding scale    . Insulin Pen Needle (CLICKFINE PEN NEEDLES) 31G X 6 MM MISC Use to check blood sugar 1 time per day. 100 each 2  . lisinopril-hydrochlorothiazide (ZESTORETIC) 10-12.5 MG tablet TAKE ONE TABLET EVERY DAY 90 tablet 3  . Omega-3 1000 MG CAPS Take 1 capsule by mouth daily.    Marland Kitchen omeprazole (PRILOSEC) 40 MG capsule TAKE 1 CAPSULE BY MOUTH ONCE DAILY. 90 capsule 0  . oxybutynin (DITROPAN-XL) 5 MG 24 hr tablet TAKE ONE TABLET BY MOUTH AT BEDTIME 90 tablet 0  . simvastatin (ZOCOR) 80 MG tablet Take 1 tablet (80 mg total) by mouth daily. 90 tablet 3   No current facility-administered medications on file prior to visit.    Allergies  Allergen Reactions  . Pioglitazone     REACTION: Edema    Family History  Problem Relation Age of Onset  . Cancer Mother        Colon Cancer, Pancreatic Cancer  . Colon cancer Mother   . Diabetes Sister   . Colon cancer Sister   . Cancer Sister        Breast Cancer  . Heart attack Father 60  . Diabetes Brother   . Heart disease Brother        related to agent orange  . Prostate cancer Maternal Grandfather   . Heart disease Paternal Grandmother   . Diabetes Brother   . Diabetes Daughter   . Kidney failure Daughter   . Heart disease Daughter 36    BP 126/74   Pulse (!) 102   Ht 5\' 4"  (1.626 m)   Wt 247 lb 3.2 oz (112.1 kg)   SpO2 98%   BMI 42.43 kg/m    Review of Systems She denies hypoglycemia.      Objective:   Physical Exam VITAL SIGNS:  See vs page GENERAL: no distress Pulses: dorsalis pedis intact bilat.   MSK: no deformity of the feet CV: no leg edema Skin:  no ulcer on the feet.  normal color and temp on the feet. Neuro: sensation is intact to touch on the feet.   Lab Results  Component Value Date   CREATININE 0.77 06/29/2018   BUN 16 06/29/2018   NA 137 06/29/2018   K 4.6 06/29/2018     CL 102 06/29/2018   CO2 28 06/29/2018    Lab Results  Component Value Date   HGBA1C 10.1 (A) 09/12/2019        Assessment & Plan:  Type 1 DM: uncontrolled HTN: is noted today   Patient Instructions  Your  blood pressure is high today.  Please see your primary care provider soon, to have it rechecked.   Please increase the insulin to 110 units each morning.   check your blood sugar twice a day.  vary the time of day when you check, between before the 3 meals, and at bedtime.  also check if you have symptoms of your blood sugar being too high or too low.  please keep a record of the readings and bring it to your next appointment here (or you can bring the meter itself).  You can write it on any piece of paper.  please call us sooner if your blood sugar goes below 70, or if you have a lot of readings over 200.     Please come back for a follow-up appointment in 2 months.

## 2019-09-13 ENCOUNTER — Ambulatory Visit (INDEPENDENT_AMBULATORY_CARE_PROVIDER_SITE_OTHER): Payer: Medicare Other | Admitting: Family Medicine

## 2019-09-13 ENCOUNTER — Encounter: Payer: Self-pay | Admitting: Family Medicine

## 2019-09-13 VITALS — BP 120/62 | HR 82 | Temp 98.2°F | Ht 64.0 in | Wt 247.0 lb

## 2019-09-13 DIAGNOSIS — Z23 Encounter for immunization: Secondary | ICD-10-CM

## 2019-09-13 DIAGNOSIS — E109 Type 1 diabetes mellitus without complications: Secondary | ICD-10-CM

## 2019-09-13 DIAGNOSIS — E7800 Pure hypercholesterolemia, unspecified: Secondary | ICD-10-CM

## 2019-09-13 DIAGNOSIS — I10 Essential (primary) hypertension: Secondary | ICD-10-CM | POA: Diagnosis not present

## 2019-09-13 NOTE — Progress Notes (Signed)
Katherine Costa DOB: Jun 19, 1950 Encounter date: 09/13/2019  This is a 69 y.o. female who presents with Chief Complaint  Patient presents with   Form Completion    History of present illness: Lost daughter 2 months ago.  Had been dealing with diabetes since she was a child.  More recently had been on dialysis and ended up having some heart issues as well.  She has been having a difficult time with this.  Daughter was only 56 years old.  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  But she is unable to keep her license without this paperwork.  She is generally feeling well overall.  Did get COVID vaccine - doesn't know dates.   Forgets to take the simvastatin at night.   Takes ditropan during the day.   Allergies  Allergen Reactions   Pioglitazone     REACTION: Edema   Current Meds  Medication Sig   augmented betamethasone dipropionate (DIPROLENE) 0.05 % ointment Apply topically 3 (three) times daily as needed. rash   CINNAMON PO Take 1 each by mouth daily.   clotrimazole-betamethasone (LOTRISONE) cream APPLY TOPICALLY THREE TIMES A DAY AS NEEDED FOR RASH.   colestipol (MICRONIZED COLESTIPOL HCL) 1 g tablet Take 3 tablets (3 g total) by mouth daily.   cyclobenzaprine (FLEXERIL) 5 MG tablet Take 1 tablet (5 mg total) by mouth 3 (three) times daily as needed for muscle spasms.   glucose blood (ONE TOUCH ULTRA TEST) test strip 1 each by Other route 2 (two) times daily. And lancets 2/day 250.01   insulin aspart (NOVOLOG) 100 UNIT/ML injection See admin instructions. Per sliding scale   insulin NPH Human (NOVOLIN N RELION) 100 UNIT/ML injection Inject 1.1 mLs (110 Units total) into the skin every morning.   Insulin Pen Needle (CLICKFINE PEN NEEDLES) 31G X 6 MM MISC Use to check blood sugar 1 time per day.    lisinopril-hydrochlorothiazide (ZESTORETIC) 10-12.5 MG tablet TAKE ONE TABLET EVERY DAY   Omega-3 1000 MG CAPS Take 1 capsule by mouth daily.   omeprazole (PRILOSEC) 40 MG capsule TAKE 1 CAPSULE BY MOUTH ONCE DAILY.   oxybutynin (DITROPAN-XL) 5 MG 24 hr tablet TAKE ONE TABLET BY MOUTH AT BEDTIME   simvastatin (ZOCOR) 80 MG tablet Take 1 tablet (80 mg total) by mouth daily.    Review of Systems  Constitutional: Negative for chills, fatigue and fever.  Respiratory: Negative for cough, chest tightness, shortness of breath and wheezing.   Cardiovascular: Negative for chest pain, palpitations and leg swelling.    Objective:  BP 120/62 (BP Location: Left Arm, Patient Position: Sitting, Cuff Size: Large)    Pulse 82    Temp 98.2 F (36.8 C) (Oral)    Ht 5\' 4"  (1.626 m)    Wt 247 lb (112 kg)    BMI 42.40 kg/m   Weight: 247 lb (112 kg)   BP Readings from Last 3 Encounters:  09/13/19 120/62  09/12/19 126/74  12/26/18 (!) 142/78   Wt Readings from Last 3 Encounters:  09/13/19 247 lb (112 kg)  09/12/19 247 lb 3.2 oz (112.1 kg)  12/26/18 244 lb 9.6 oz (110.9 kg)    Physical Exam Constitutional:      General: She is not in acute distress.    Appearance: She is well-developed.  Cardiovascular:  Rate and Rhythm: Normal rate and regular rhythm.     Heart sounds: Normal heart sounds. No murmur heard.  No friction rub.  Pulmonary:     Effort: Pulmonary effort is normal. No respiratory distress.     Breath sounds: Normal breath sounds. No wheezing or rales.  Musculoskeletal:     Right lower leg: No edema.     Left lower leg: No edema.  Neurological:     Mental Status: She is alert and oriented to person, place, and time.  Psychiatric:        Behavior: Behavior normal.     Comments: She is tearful during exam, especially when talking about her daughter.     Assessment/Plan  1. Essential hypertension Blood pressure stable.  Continue current medication. - CBC with  Differential/Platelet; Future - Comprehensive metabolic panel; Future  2. Type 1 diabetes mellitus without complication [facility]) Following with endocrinology.  Blood sugars been somewhat high.  She is working on getting back on track with healthy eating and regular exercise.  Recent months of been difficult with daughter being more sick and full-time care of her.  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 - Microalbumin / creatinine urine ratio; Future  3. HYPERCHOLESTEROLEMIA Forgetting to take simvastatin most of the time.  We discussed that although it is ideal to take at bedtime, taking during the day would be better than not taking it at all.  She is going to try to put this on her nightstand but if she cannot remember she was advised to just take with her other medications in the morning. - Lipid panel; Future - TSH; Future  4. Morbid obesity ([place]) See above.  She is working on getting back on track with healthy eating and regular exercise.  We will follow up at next visit.  5. Need for pneumococcal vaccination - Pneumococcal conjugate vaccine 13-valent IM   Return in about 6 months (around 03/15/2020) for physical exam.    Micheline Rough, MD

## 2019-09-27 ENCOUNTER — Other Ambulatory Visit: Payer: Medicare Other

## 2019-09-28 ENCOUNTER — Other Ambulatory Visit (INDEPENDENT_AMBULATORY_CARE_PROVIDER_SITE_OTHER): Payer: Medicare Other

## 2019-09-28 ENCOUNTER — Other Ambulatory Visit: Payer: Self-pay

## 2019-09-28 DIAGNOSIS — E7800 Pure hypercholesterolemia, unspecified: Secondary | ICD-10-CM

## 2019-09-28 DIAGNOSIS — E109 Type 1 diabetes mellitus without complications: Secondary | ICD-10-CM

## 2019-09-28 DIAGNOSIS — I10 Essential (primary) hypertension: Secondary | ICD-10-CM

## 2019-09-29 LAB — COMPREHENSIVE METABOLIC PANEL
AG Ratio: 1.3 (calc) (ref 1.0–2.5)
ALT: 20 U/L (ref 6–29)
AST: 33 U/L (ref 10–35)
Albumin: 3.8 g/dL (ref 3.6–5.1)
Alkaline phosphatase (APISO): 103 U/L (ref 37–153)
BUN: 19 mg/dL (ref 7–25)
CO2: 25 mmol/L (ref 20–32)
Calcium: 9.3 mg/dL (ref 8.6–10.4)
Chloride: 101 mmol/L (ref 98–110)
Creat: 0.79 mg/dL (ref 0.50–0.99)
Globulin: 2.9 g/dL (calc) (ref 1.9–3.7)
Glucose, Bld: 155 mg/dL — ABNORMAL HIGH (ref 65–99)
Potassium: 4.7 mmol/L (ref 3.5–5.3)
Sodium: 137 mmol/L (ref 135–146)
Total Bilirubin: 0.5 mg/dL (ref 0.2–1.2)
Total Protein: 6.7 g/dL (ref 6.1–8.1)

## 2019-09-29 LAB — CBC WITH DIFFERENTIAL/PLATELET
Absolute Monocytes: 563 cells/uL (ref 200–950)
Basophils Absolute: 53 cells/uL (ref 0–200)
Basophils Relative: 0.7 %
Eosinophils Absolute: 98 cells/uL (ref 15–500)
Eosinophils Relative: 1.3 %
HCT: 46.5 % — ABNORMAL HIGH (ref 35.0–45.0)
Hemoglobin: 15.2 g/dL (ref 11.7–15.5)
Lymphs Abs: 1110 cells/uL (ref 850–3900)
MCH: 29.4 pg (ref 27.0–33.0)
MCHC: 32.7 g/dL (ref 32.0–36.0)
MCV: 89.9 fL (ref 80.0–100.0)
MPV: 10.7 fL (ref 7.5–12.5)
Monocytes Relative: 7.5 %
Neutro Abs: 5678 cells/uL (ref 1500–7800)
Neutrophils Relative %: 75.7 %
Platelets: 288 10*3/uL (ref 140–400)
RBC: 5.17 10*6/uL — ABNORMAL HIGH (ref 3.80–5.10)
RDW: 12.4 % (ref 11.0–15.0)
Total Lymphocyte: 14.8 %
WBC: 7.5 10*3/uL (ref 3.8–10.8)

## 2019-09-29 LAB — LIPID PANEL
Cholesterol: 274 mg/dL — ABNORMAL HIGH (ref <200)
HDL: 83 mg/dL (ref >=50)
LDL Cholesterol (Calc): 164 mg/dL (calc) — ABNORMAL HIGH
Non-HDL Cholesterol (Calc): 191 mg/dL (calc) — ABNORMAL HIGH (ref <130)
Total CHOL/HDL Ratio: 3.3 (calc) (ref <5.0)
Triglycerides: 133 mg/dL (ref <150)

## 2019-09-29 LAB — MICROALBUMIN / CREATININE URINE RATIO
Creatinine, Urine: 157 mg/dL (ref 20–275)
Microalb Creat Ratio: 4 mcg/mg creat (ref <30)
Microalb, Ur: 0.6 mg/dL

## 2019-09-29 LAB — TSH: TSH: 1.9 mIU/L (ref 0.40–4.50)

## 2019-11-08 ENCOUNTER — Other Ambulatory Visit: Payer: Self-pay | Admitting: Family Medicine

## 2019-11-15 ENCOUNTER — Ambulatory Visit: Payer: Medicare Other | Admitting: Endocrinology

## 2020-01-18 ENCOUNTER — Encounter: Payer: Self-pay | Admitting: Family Medicine

## 2020-01-23 NOTE — Telephone Encounter (Signed)
Pt is calling in stating that she will be out of the following Rx's lisinopril-hydrochlorothiazide (ZESTORECTIC) 10-12.5 MG and omeprazole (PRILOSEC) 40 MG #90 on both  Pharm:  [facility].  Pt has made an appointment on 03/18/2020 and stated that the medication can be called in the first of the year.

## 2020-01-24 MED ORDER — OMEPRAZOLE 40 MG PO CPDR
40.0000 mg | DELAYED_RELEASE_CAPSULE | Freq: Every day | ORAL | 3 refills | Status: DC
Start: 2020-01-24 — End: 2021-02-12

## 2020-01-24 MED ORDER — LISINOPRIL-HYDROCHLOROTHIAZIDE 10-12.5 MG PO TABS
1.0000 | ORAL_TABLET | Freq: Every day | ORAL | 3 refills | Status: DC
Start: 2020-01-24 — End: 2020-10-07

## 2020-03-18 ENCOUNTER — Encounter: Payer: Self-pay | Admitting: Family Medicine

## 2020-03-18 ENCOUNTER — Ambulatory Visit (INDEPENDENT_AMBULATORY_CARE_PROVIDER_SITE_OTHER): Payer: Medicare Other | Admitting: Family Medicine

## 2020-03-18 ENCOUNTER — Other Ambulatory Visit: Payer: Self-pay

## 2020-03-18 ENCOUNTER — Ambulatory Visit (INDEPENDENT_AMBULATORY_CARE_PROVIDER_SITE_OTHER): Payer: Medicare Other

## 2020-03-18 VITALS — BP 132/78 | HR 82 | Temp 98.4°F | Ht 64.0 in | Wt 248.0 lb

## 2020-03-18 DIAGNOSIS — K219 Gastro-esophageal reflux disease without esophagitis: Secondary | ICD-10-CM

## 2020-03-18 DIAGNOSIS — I10 Essential (primary) hypertension: Secondary | ICD-10-CM | POA: Diagnosis not present

## 2020-03-18 DIAGNOSIS — Z23 Encounter for immunization: Secondary | ICD-10-CM | POA: Diagnosis not present

## 2020-03-18 DIAGNOSIS — Z0000 Encounter for general adult medical examination without abnormal findings: Secondary | ICD-10-CM

## 2020-03-18 DIAGNOSIS — E7800 Pure hypercholesterolemia, unspecified: Secondary | ICD-10-CM

## 2020-03-18 DIAGNOSIS — M542 Cervicalgia: Secondary | ICD-10-CM

## 2020-03-18 DIAGNOSIS — E2839 Other primary ovarian failure: Secondary | ICD-10-CM | POA: Diagnosis not present

## 2020-03-18 DIAGNOSIS — E109 Type 1 diabetes mellitus without complications: Secondary | ICD-10-CM | POA: Diagnosis not present

## 2020-03-18 LAB — COMPREHENSIVE METABOLIC PANEL
ALT: 17 U/L (ref 0–35)
AST: 16 U/L (ref 0–37)
Albumin: 3.8 g/dL (ref 3.5–5.2)
Alkaline Phosphatase: 123 U/L — ABNORMAL HIGH (ref 39–117)
BUN: 17 mg/dL (ref 6–23)
CO2: 28 mEq/L (ref 19–32)
Calcium: 9.8 mg/dL (ref 8.4–10.5)
Chloride: 97 mEq/L (ref 96–112)
Creatinine, Ser: 0.85 mg/dL (ref 0.40–1.20)
GFR: 70.82 mL/min (ref >60.00)
Glucose, Bld: 312 mg/dL — ABNORMAL HIGH (ref 70–99)
Potassium: 4.9 mEq/L (ref 3.5–5.1)
Sodium: 132 mEq/L — ABNORMAL LOW (ref 135–145)
Total Bilirubin: 0.6 mg/dL (ref 0.2–1.2)
Total Protein: 7 g/dL (ref 6.0–8.3)

## 2020-03-18 LAB — LIPID PANEL
Cholesterol: 253 mg/dL — ABNORMAL HIGH (ref 0–200)
HDL: 83.8 mg/dL (ref >39.00)
LDL Cholesterol: 146 mg/dL — ABNORMAL HIGH (ref 0–99)
NonHDL: 168.89
Total CHOL/HDL Ratio: 3
Triglycerides: 115 mg/dL (ref 0.0–149.0)
VLDL: 23 mg/dL (ref 0.0–40.0)

## 2020-03-18 LAB — MICROALBUMIN / CREATININE URINE RATIO
Creatinine,U: 70.5 mg/dL
Microalb Creat Ratio: 1 mg/g (ref 0.0–30.0)
Microalb, Ur: <0.7 mg/dL (ref 0.0–1.9)

## 2020-03-18 LAB — CBC WITH DIFFERENTIAL/PLATELET
Basophils Absolute: 0.1 10*3/uL (ref 0.0–0.1)
Basophils Relative: 1.1 % (ref 0.0–3.0)
Eosinophils Absolute: 0.2 10*3/uL (ref 0.0–0.7)
Eosinophils Relative: 3.7 % (ref 0.0–5.0)
HCT: 43.4 % (ref 36.0–46.0)
Hemoglobin: 14.5 g/dL (ref 12.0–15.0)
Lymphocytes Relative: 20.7 % (ref 12.0–46.0)
Lymphs Abs: 1.1 10*3/uL (ref 0.7–4.0)
MCHC: 33.5 g/dL (ref 30.0–36.0)
MCV: 88 fl (ref 78.0–100.0)
Monocytes Absolute: 0.6 10*3/uL (ref 0.1–1.0)
Monocytes Relative: 10.7 % (ref 3.0–12.0)
Neutro Abs: 3.5 10*3/uL (ref 1.4–7.7)
Neutrophils Relative %: 63.8 % (ref 43.0–77.0)
Platelets: 307 10*3/uL (ref 150.0–400.0)
RBC: 4.93 Mil/uL (ref 3.87–5.11)
RDW: 13.8 % (ref 11.5–15.5)
WBC: 5.4 10*3/uL (ref 4.0–10.5)

## 2020-03-18 LAB — HEMOGLOBIN A1C: Hgb A1c MFr Bld: 10.6 % — ABNORMAL HIGH (ref 4.6–6.5)

## 2020-03-18 MED ORDER — SHINGRIX 50 MCG/0.5ML IM SUSR: 0.5000 mL | Freq: Once | INTRAMUSCULAR | 0 refills | Status: AC

## 2020-03-18 NOTE — Progress Notes (Signed)
Katherine Costa DOB: [birth year] Encounter date: 03/18/2020  This is a 70 y.o. female who presents for complete physical   History of present illness/Additional concerns: Mammogram: last 02/2019  Would like to see about getting continuous glucometer.   DEXA: has had in remote past.  Eye exam: had this in July 3rd covid: has been completed.  Flu: will get today Shingles: she is agreeable and we will print today. (some concern for coverage with united) Mammogram - she got a call back and is going back in march for recheck.   HTN: lisinopril-hctz; hasn't been checking regularly at home, but was 631 systolic at dentist last week, but she was anxious and diastolic was normal.   GERD: prilosec 40mg  daily  She does follow with dermatology regularly.   DMI: follows with Dr. Loanne Drilling, but would like to switch care here if possible. She has had difficulty with coverage of medication. She has been using the novolin nph. Insurance is no longer covering lantus, so she isn't sure what she should switch to. Has been doing 100 units of novolin nph, hasn't had the novolog filled, but had humalog that she has been using. Using 5-7 units with each meal. She isn't sure about fasting sugars; pre-meal sugars are 190's.    HL: zocor 80mg , colestipol. No issues with muscle aches/pains on this.   Pap? (gyn provider listed Dr. Nori Riis; states done 2020) colonoscopy recommend repeat in 07/2020 due to polyps   Past Medical History:  Diagnosis Date  . Arthritis   . ASYMPTOMATIC POSTMENOPAUSAL STATUS 05/21/2009  . DIABETES MELLITUS, TYPE I 08/24/2006  . Family history of colon cancer   . HERPES LABIALIS 03/13/2009  . HYPERCHOLESTEROLEMIA 02/09/2008  . Hyperlipidemia   . HYPERTENSION 08/24/2006  . Hypertension   . LIVER DISORDER 03/17/2010  . Tubulovillous adenoma of colon    2010    Past Surgical History:  Procedure Laterality Date  . ABDOMINAL HYSTERECTOMY     endometriosis; menorrhagia  . ACHILLES  TENDON REPAIR  2010   right heel  . CHOLECYSTECTOMY  2001  . TOTAL KNEE ARTHROPLASTY Left 09/13/2015   Procedure: TOTAL KNEE ARTHROPLASTY;  Surgeon: Earlie Server, MD;  Location: [place];  Service: Orthopedics;  Laterality: Left;   Allergies  Allergen Reactions  . Pioglitazone     REACTION: Edema   Current Meds  Medication Sig  . Ascorbic Acid (VITAMIN C) 1000 MG tablet Take 1,000 mg by mouth daily.  Marland Kitchen aspirin 81 MG EC tablet Take 81 mg by mouth daily. Swallow whole.  . augmented betamethasone dipropionate (DIPROLENE) 0.05 % ointment Apply topically 3 (three) times daily as needed. rash  . clotrimazole-betamethasone (LOTRISONE) cream APPLY TOPICALLY THREE TIMES A DAY AS NEEDED FOR RASH.  . colestipol (MICRONIZED COLESTIPOL HCL) 1 g tablet Take 3 tablets (3 g total) by mouth daily.  . Continuous Blood Gluc Receiver (DEXCOM G6 RECEIVER) DEVI 1 Device by Does not apply route as needed.  . Continuous Blood Gluc Sensor (DEXCOM G6 SENSOR) MISC 1 Device by Does not apply route as needed.  . Continuous Blood Gluc Transmit (DEXCOM G6 TRANSMITTER) MISC 1 transmitter every 10 days  . glucose blood (ONE TOUCH ULTRA TEST) test strip 1 each by Other route 2 (two) times daily. And lancets 2/day 250.01  . insulin lispro (HUMALOG) 100 UNIT/ML injection Inject into the skin. Inject 5-7 units as needed  . lisinopril-hydrochlorothiazide (ZESTORETIC) 10-12.5 MG tablet Take 1 tablet by mouth daily.  . Magnesium 400 MG CAPS Take by  mouth daily.  Marland Kitchen omeprazole (PRILOSEC) 40 MG capsule Take 1 capsule (40 mg total) by mouth daily.  Marland Kitchen oxybutynin (DITROPAN-XL) 5 MG 24 hr tablet TAKE ONE TABLET BY MOUTH AT BEDTIME  . simvastatin (ZOCOR) 80 MG tablet Take 1 tablet (80 mg total) by mouth daily.  . [EXPIRED] Zoster Vaccine Adjuvanted [facility]) injection Inject 0.5 mLs into the muscle once for 1 dose. Repeat in 2-6 months   Social History   Tobacco Use  . Smoking status: Never Smoker  . Smokeless tobacco: Never Used   Substance Use Topics  . Alcohol use: No    Alcohol/week: 0.0 standard drinks   Family History  Problem Relation Age of Onset  . Cancer Mother        Colon Cancer, Pancreatic Cancer  . Colon cancer Mother   . Diabetes Sister   . Colon cancer Sister   . Cancer Sister        Breast Cancer  . Heart attack Father 61  . Diabetes Brother   . Heart disease Brother        related to agent orange  . Prostate cancer Maternal Grandfather   . Heart disease Paternal Grandmother   . Diabetes Brother   . Diabetes Daughter   . Kidney failure Daughter   . Heart disease Daughter 28     Review of Systems  Constitutional: Negative for activity change, appetite change, chills, fatigue, fever and unexpected weight change.  HENT: Negative for congestion, ear pain, hearing loss, sinus pressure, sinus pain, sore throat and trouble swallowing.   Eyes: Negative for pain and visual disturbance.  Respiratory: Negative for cough, chest tightness, shortness of breath and wheezing.   Cardiovascular: Negative for chest pain, palpitations and leg swelling.  Gastrointestinal: Negative for abdominal pain, blood in stool, constipation, diarrhea, nausea and vomiting.  Genitourinary: Negative for difficulty urinating and menstrual problem.  Musculoskeletal: Positive for arthralgias (knees) and back pain (difficult to get around; has to lean on cart in store).  Skin: Negative for rash.  Neurological: Negative for dizziness, weakness, numbness and headaches.  Hematological: Negative for adenopathy. Does not bruise/bleed easily.  Psychiatric/Behavioral: Negative for sleep disturbance and suicidal ideas. The patient is not nervous/anxious.     CBC:  Lab Results  Component Value Date   WBC 5.4 03/18/2020   HGB 14.5 03/18/2020   HCT 43.4 03/18/2020   MCH 29.4 09/28/2019   MCHC 33.5 03/18/2020   RDW 13.8 03/18/2020   PLT 307.0 03/18/2020   MPV 10.7 09/28/2019   CMP: Lab Results  Component Value Date   NA  132 (L) 03/18/2020   K 4.9 03/18/2020   CL 97 03/18/2020   CO2 28 03/18/2020   ANIONGAP 9 09/14/2015   GLUCOSE 312 (H) 03/18/2020   GLUCOSE 235 (H) 02/09/2006   BUN 17 03/18/2020   CREATININE 0.85 03/18/2020   CREATININE 0.79 09/28/2019   GFRAA >60 09/14/2015   CALCIUM 9.8 03/18/2020   PROT 7.0 03/18/2020   BILITOT 0.6 03/18/2020   ALKPHOS 123 (H) 03/18/2020   ALT 17 03/18/2020   AST 16 03/18/2020   LIPID: Lab Results  Component Value Date   CHOL 253 (H) 03/18/2020   TRIG 115.0 03/18/2020   TRIG 61 02/09/2006   HDL 83.80 03/18/2020   LDLCALC 146 (H) 03/18/2020   LDLCALC 164 (H) 09/28/2019    Objective:  BP 132/78 (BP Location: Left Arm, Patient Position: Sitting, Cuff Size: Large)   Pulse 82   Temp 98.4 F (36.9 C) (  Oral)   Ht 5\' 4"  (1.626 m)   Wt 248 lb (112.5 kg)   SpO2 97%   BMI 42.57 kg/m   Weight: 248 lb (112.5 kg)   BP Readings from Last 3 Encounters:  03/18/20 132/78  09/13/19 120/62  09/12/19 126/74   Wt Readings from Last 3 Encounters:  03/18/20 248 lb (112.5 kg)  09/13/19 247 lb (112 kg)  09/12/19 247 lb 3.2 oz (112.1 kg)    Physical Exam Constitutional:      General: She is not in acute distress.    Appearance: She is well-developed and well-nourished.  HENT:     Head: Normocephalic and atraumatic.     Right Ear: External ear normal.     Left Ear: External ear normal.     Mouth/Throat:     Mouth: Oropharynx is clear and moist.     Pharynx: No oropharyngeal exudate.  Eyes:     Conjunctiva/sclera: Conjunctivae normal.     Pupils: Pupils are equal, round, and reactive to light.  Neck:     Thyroid: No thyromegaly.     Comments: Extremely limited range of motion of the neck.  She has approximately 10 degrees of neck flexion, minimal extension, a couple of degrees of right turn, and almost no ability to left turn.  Negative Spurling's. Cardiovascular:     Rate and Rhythm: Normal rate and regular rhythm.     Heart sounds: Normal heart sounds.  No murmur heard. No friction rub. No gallop.   Pulmonary:     Effort: Pulmonary effort is normal.     Breath sounds: Normal breath sounds.  Abdominal:     General: Bowel sounds are normal. There is no distension.     Palpations: Abdomen is soft. There is no mass.     Tenderness: There is no abdominal tenderness. There is no guarding.     Hernia: No hernia is present.  Musculoskeletal:        General: No tenderness, deformity or edema. Normal range of motion.     Cervical back: Normal range of motion and neck supple.  Lymphadenopathy:     Cervical: No cervical adenopathy.  Skin:    General: Skin is warm and dry.     Findings: No rash.     Comments: Normal foot exam with normal monofilament sensation.  Neurological:     Mental Status: She is alert and oriented to person, place, and time.     Deep Tendon Reflexes: Strength normal. Reflexes normal.     Reflex Scores:      Tricep reflexes are 2+ on the right side and 2+ on the left side.      Bicep reflexes are 2+ on the right side and 2+ on the left side.      Brachioradialis reflexes are 2+ on the right side and 2+ on the left side.      Patellar reflexes are 2+ on the right side and 2+ on the left side. Psychiatric:        Mood and Affect: Mood and affect normal.        Speech: Speech normal.        Behavior: Behavior normal.        Thought Content: Thought content normal.     Assessment/Plan: Health Maintenance Due  Topic Date Due  . DEXA SCAN  Never done   Health Maintenance reviewed - shingrix printed, flu given today. Will get records from gyn and ophtho.  1. Preventative health care  We discussed importance of regular exercise and maintaining a healthy weight.  She is limited by arthritis.  Bone density ordered today.  See above.  2. Estrogen deficiency Bone density ordered today.  Paper order given for [facility].  3. Gastroesophageal reflux disease, unspecified whether esophagitis present Continue with Prilosec 40 mg  daily.  4. HYPERCHOLESTEROLEMIA Simvastatin 80 mg daily.  She has not had muscle aches or cramps at this. - Lipid panel; Future - Lipid panel  5. Essential hypertension Blood pressures generally been well controlled.  Continue with lisinopril-hydrochlorothiazide 10-12.5 mg daily. - CBC with Differential/Platelet; Future - Comprehensive metabolic panel; Future - Comprehensive metabolic panel - CBC with Differential/Platelet  6. Type 1 diabetes mellitus without complication [facility]) She has been following with endocrinology.  Sugars not been well controlled.  I do think the continuous monitor would be helpful for her and make it easier for her to pay closer attention to sugar monitoring.  I am going to put in referral for chronic care management to help with med cost if able.  Currently she is getting insulin through [facility]. - Hemoglobin A1c; Future - HM DIABETES FOOT EXAM; Future - Microalbumin / creatinine urine ratio; Future - Microalbumin / creatinine urine ratio - Hemoglobin A1c - Continuous Blood Gluc Receiver (DEXCOM G6 RECEIVER) DEVI; 1 Device by Does not apply route as needed.  Dispense: 1 each; Refill: 0 - Continuous Blood Gluc Sensor (DEXCOM G6 SENSOR) MISC; 1 Device by Does not apply route as needed.  Dispense: 1 each; Refill: 3 - Continuous Blood Gluc Transmit (DEXCOM G6 TRANSMITTER) MISC; 1 transmitter every 10 days  Dispense: 1 each; Refill: 3  7. Neck pain Start with xray; ROM is very limited. Consider further eval/treatment pending response. - DG Cervical Spine Complete; Future  8. Need for immunization against influenza - Flu Vaccine QUAD High Dose(Fluad)    Return for Pending blood work results.  Micheline Rough, MD

## 2020-03-19 ENCOUNTER — Other Ambulatory Visit: Payer: Self-pay | Admitting: Family Medicine

## 2020-03-19 MED ORDER — DEXCOM G6 RECEIVER DEVI: 1.0000 | 0 refills | Status: DC | PRN

## 2020-03-19 MED ORDER — DEXCOM G6 TRANSMITTER MISC: 3 refills | Status: DC

## 2020-03-19 MED ORDER — DEXCOM G6 SENSOR MISC
1.0000 | 3 refills | Status: DC | PRN
Start: 2020-03-19 — End: 2022-04-10

## 2020-03-19 MED ORDER — ROSUVASTATIN CALCIUM 20 MG PO TABS: 20.0000 mg | ORAL_TABLET | Freq: Every day | ORAL | 3 refills | Status: DC

## 2020-03-21 NOTE — Addendum Note (Signed)
Addended by: Agnes Lawrence on: 03/21/2020 10:17 AM   Modules accepted: Orders

## 2020-03-26 ENCOUNTER — Telehealth: Payer: Self-pay | Admitting: Family Medicine

## 2020-03-26 NOTE — Progress Notes (Signed)
  Chronic Care Management   Note  03/26/2020 Name: Katherine Costa MRN: [id number] DOB: [birth year]  Katherine Costa is a 70 y.o. year old female who is a primary care patient of Koberlein, Steele Berg, MD. I reached out to Katherine Costa by phone today in response to a referral sent by Katherine Costa's PCP, [name], MD.   Katherine Costa was given information about Chronic Care Management services today including:  1. CCM service includes personalized support from designated clinical staff supervised by her physician, including individualized plan of care and coordination with other care providers 2. 24/7 contact phone numbers for assistance for urgent and routine care needs. 3. Service will only be billed when office clinical staff spend 20 minutes or more in a month to coordinate care. 4. Only one practitioner may furnish and bill the service in a calendar month. 5. The patient may stop CCM services at any time (effective at the end of the month) by phone call to the office staff.   Patient agreed to services and verbal consent obtained.   Follow up plan:   Carley Perdue [employer] Scheduler

## 2020-04-04 ENCOUNTER — Encounter: Payer: Self-pay | Admitting: Family Medicine

## 2020-04-09 ENCOUNTER — Encounter: Payer: Self-pay | Admitting: Family Medicine

## 2020-04-11 ENCOUNTER — Other Ambulatory Visit: Payer: Self-pay

## 2020-04-11 ENCOUNTER — Encounter: Payer: Self-pay | Admitting: Surgery

## 2020-04-11 ENCOUNTER — Ambulatory Visit: Payer: Medicare Other | Admitting: Surgery

## 2020-04-11 VITALS — Ht 65.0 in | Wt 250.0 lb

## 2020-04-11 DIAGNOSIS — M47812 Spondylosis without myelopathy or radiculopathy, cervical region: Secondary | ICD-10-CM | POA: Diagnosis not present

## 2020-04-11 DIAGNOSIS — M542 Cervicalgia: Secondary | ICD-10-CM

## 2020-04-11 MED ORDER — METHOCARBAMOL 500 MG PO TABS: 500.0000 mg | ORAL_TABLET | Freq: Four times a day (QID) | ORAL | 0 refills | Status: DC | PRN

## 2020-04-11 MED ORDER — KETOROLAC TROMETHAMINE 30 MG/ML IJ SOLN: 30.0000 mg | Freq: Once | INTRAMUSCULAR | Status: AC

## 2020-04-11 NOTE — Progress Notes (Signed)
Office Visit Note   Patient: Katherine Costa           Date of Birth: 11/05/1950           MRN: [id number] Visit Date: 04/11/2020              Requested by: [name], MD [place],  [place] [address] PCP: [name], MD   Assessment & Plan: Visit Diagnoses:  1. Spondylosis without myelopathy or radiculopathy, cervical region   2. Neck pain     Plan: With patient's ongoing and worsening neck pain and findings on recent cervical spine x-ray I will schedule cervical MRI to rule out HNP/stenosis.  We will have her follow-up with Dr. Louanne Skye after completion to discuss results and further treatment options.  With the degenerative changes that she has at C1-2 and also further down the neck I did have blood work drawn today to check a CBC and arthritis panel.  Patient was given a Toradol 30 mg IM injection today in the clinic and I did send in Robaxin for muscle spasms to her pharmacy.  With patient's history of uncontrolled diabetes I did not feel comfortable giving her a prednisone taper.  Follow-Up Instructions: Return in about 3 weeks (around 05/02/2020) for with dr Louanne Skye to review cervical mri.   Orders:  Orders Placed This Encounter  Procedures  . MR Cervical Spine w/o contrast  . CBC  . Antinuclear Antib (ANA)  . Rheumatoid Factor  . Uric acid  . Sed Rate (ESR)   Meds ordered this encounter  Medications  . methocarbamol (ROBAXIN) 500 MG tablet    Sig: Take 1 tablet (500 mg total) by mouth every 6 (six) hours as needed for muscle spasms.    Dispense:  60 tablet    Refill:  0  . ketorolac (TORADOL) 30 MG/ML injection 30 mg      Procedures: No procedures performed   Clinical Data: No additional findings.   Subjective: Chief Complaint  Patient presents with  . Neck - Pain    HPI 70 year old white female who is new patient to clinic is being seen at the request of her primary care physician Dr. Micheline Rough for ongoing and  worsening neck pain.  Patient states that neck pain has been ongoing x3 months.  No injury.  Pain is more localized to the posterior and left side of neck and she has also had some episodes of headaches.  Denies upper extremity radicular symptoms.  States that she is losing range of motion with turning her head and is also causes a considerable mild discomfort.  No upper or lower extremity feeling of weakness.  She has been followed by her primary care physician who did get cervical spine x-rays March 18, 2020 and that report showed;  CLINICAL DATA:  Left neck pain with limited range of motion for 2 months.  EXAM: CERVICAL SPINE - COMPLETE 4+ VIEW  COMPARISON:  None.  FINDINGS: The cervical spine is visualized laterally to the C6 level. C7 and T1 are obscured by the patient's shoulders. There is grade 1 anterolisthesis of C3 on C4, C4 on C5, and C5 on C6. No acute fracture is identified. C1-2 arthropathy is asymmetrically advanced on the left. There is widespread advanced cervical facet arthrosis bilaterally. Mild disc space narrowing is noted at C4-5 and C5-6. Assessment for neural foraminal stenosis is somewhat limited by obliquity, however there appears to be significant right-sided osseous neural foraminal stenosis  at both levels, severe at C6-7, while a left-sided osseous neural foraminal stenosis is milder in appearance. The prevertebral soft tissues are within normal limits.  IMPRESSION: Advanced cervical facet arthrosis and mild disc degeneration with evidence of right greater than left-sided neural foraminal stenosis.   Electronically Signed   By: Logan Bores M.D.   On: 03/18/2020 15:02  Patient has tried Tylenol, ibuprofen, BC powders without any improvement.  She does have a history of diabetes that is been somewhat uncontrolled.  Denies personal history of lupus, rheumatoid arthritis.  She believes she has an older sister with rheumatoid  arthritis.     Review of Systems No current cardiac pulmonary GI GU issues  Objective: Vital Signs: Ht _0  (1.651 m)   Wt 250 lb (113.4 kg)   BMI 41.60 kg/m   Physical Exam Eyes:     Extraocular Movements: Extraocular movements intact.  Neck:     Comments: Some limitation with cervical flexion/extension.  Patient also has limited left and right rotation of the neck.  Discomfort with these movements.  Mild posterior neck tenderness.  Positive bilateral brachial plexus tenderness. Pulmonary:     Effort: No respiratory distress.  Neurological:     General: No focal deficit present.     Mental Status: She is alert and oriented to person, place, and time.  Psychiatric:        Mood and Affect: Mood normal.     Ortho Exam  Specialty Comments:  No specialty comments available.  Imaging: No results found.   PMFS History: Patient Active Problem List   Diagnosis Date Noted  . Urinary incontinence 06/24/2018  . Gastroesophageal reflux disease 02/11/2018  . Primary localized osteoarthritis of left knee 09/13/2015  . Pain in joint, shoulder region 06/01/2014  . Morbid obesity ([place]) 04/21/2013  . Encounter for long-term (current) use of other medications 09/02/2011  . LIVER DISORDER 03/17/2010  . ASYMPTOMATIC POSTMENOPAUSAL STATUS 05/21/2009  . HERPES LABIALIS 03/13/2009  . HYPERCHOLESTEROLEMIA 02/09/2008  . Type 1 diabetes mellitus ([place]) 08/24/2006  . Essential hypertension 08/24/2006   Past Medical History:  Diagnosis Date  . Arthritis   . ASYMPTOMATIC POSTMENOPAUSAL STATUS 05/21/2009  . DIABETES MELLITUS, TYPE I 08/24/2006  . Family history of colon cancer   . HERPES LABIALIS 03/13/2009  . HYPERCHOLESTEROLEMIA 02/09/2008  . Hyperlipidemia   . HYPERTENSION 08/24/2006  . Hypertension   . LIVER DISORDER 03/17/2010  . Tubulovillous adenoma of colon    2010     Family History  Problem Relation Age of Onset  . Cancer Mother        Colon Cancer, Pancreatic Cancer  .  Colon cancer Mother   . Diabetes Sister   . Colon cancer Sister   . Cancer Sister        Breast Cancer  . Heart attack Father 51  . Diabetes Brother   . Heart disease Brother        related to agent orange  . Prostate cancer Maternal Grandfather   . Heart disease Paternal Grandmother   . Diabetes Brother   . Diabetes Daughter   . Kidney failure Daughter   . Heart disease Daughter 60    Past Surgical History:  Procedure Laterality Date  . ABDOMINAL HYSTERECTOMY     endometriosis; menorrhagia  . ACHILLES TENDON REPAIR  2010   right heel  . CHOLECYSTECTOMY  2001  . TOTAL KNEE ARTHROPLASTY Left 09/13/2015   Procedure: TOTAL KNEE ARTHROPLASTY;  Surgeon: Earlie Server, MD;  Location:  [facility];  Service: Orthopedics;  Laterality: Left;   Social History   Occupational History  . Occupation: Teacher, music  Tobacco Use  . Smoking status: Never Smoker  . Smokeless tobacco: Never Used  Substance and Sexual Activity  . Alcohol use: No    Alcohol/week: 0.0 standard drinks  . Drug use: No  . Sexual activity: Not on file

## 2020-04-12 LAB — CBC
HCT: 43.7 % (ref 35.0–45.0)
Hemoglobin: 14.2 g/dL (ref 11.7–15.5)
MCH: 29.2 pg (ref 27.0–33.0)
MCHC: 32.5 g/dL (ref 32.0–36.0)
MCV: 89.9 fL (ref 80.0–100.0)
MPV: 10.3 fL (ref 7.5–12.5)
Platelets: 282 10*3/uL (ref 140–400)
RBC: 4.86 10*6/uL (ref 3.80–5.10)
RDW: 12.8 % (ref 11.0–15.0)
WBC: 5.8 10*3/uL (ref 3.8–10.8)

## 2020-04-12 LAB — RHEUMATOID FACTOR: Rheumatoid fact SerPl-aCnc: <14 IU/mL (ref <14)

## 2020-04-12 LAB — ANA: Anti Nuclear Antibody (ANA): NEGATIVE

## 2020-04-12 LAB — HM DEXA SCAN

## 2020-04-12 LAB — URIC ACID: Uric Acid, Serum: 3.5 mg/dL (ref 2.5–7.0)

## 2020-04-12 LAB — SEDIMENTATION RATE: Sed Rate: 2 mm/h (ref 0–30)

## 2020-04-17 ENCOUNTER — Encounter: Payer: Self-pay | Admitting: Family Medicine

## 2020-04-18 ENCOUNTER — Other Ambulatory Visit: Payer: Self-pay | Admitting: Radiology

## 2020-04-18 ENCOUNTER — Encounter: Payer: Self-pay | Admitting: Family Medicine

## 2020-04-24 ENCOUNTER — Ambulatory Visit
Admission: RE | Admit: 2020-04-24 | Discharge: 2020-04-24 | Disposition: A | Discharge status: Home / Self Care | Payer: Medicare Other | Source: Ambulatory Visit | Attending: Surgery | Admitting: Surgery

## 2020-04-24 ENCOUNTER — Other Ambulatory Visit: Payer: Self-pay

## 2020-04-24 DIAGNOSIS — M47812 Spondylosis without myelopathy or radiculopathy, cervical region: Secondary | ICD-10-CM

## 2020-04-24 DIAGNOSIS — M542 Cervicalgia: Secondary | ICD-10-CM

## 2020-04-27 ENCOUNTER — Other Ambulatory Visit: Payer: Medicare Other

## 2020-05-08 ENCOUNTER — Ambulatory Visit: Payer: Medicare Other | Admitting: Surgery

## 2020-05-09 ENCOUNTER — Ambulatory Visit: Payer: Medicare Other | Admitting: Specialist

## 2020-05-09 ENCOUNTER — Encounter: Payer: Self-pay | Admitting: Specialist

## 2020-05-09 VITALS — BP 142/79 | HR 81 | Ht 65.0 in | Wt 250.0 lb

## 2020-05-09 DIAGNOSIS — M47812 Spondylosis without myelopathy or radiculopathy, cervical region: Secondary | ICD-10-CM | POA: Diagnosis not present

## 2020-05-09 DIAGNOSIS — M542 Cervicalgia: Secondary | ICD-10-CM | POA: Diagnosis not present

## 2020-05-09 MED ORDER — METHOCARBAMOL 500 MG PO TABS: 500.0000 mg | ORAL_TABLET | Freq: Three times a day (TID) | ORAL | 1 refills | Status: DC | PRN

## 2020-05-09 MED ORDER — GABAPENTIN 100 MG PO CAPS: 100.0000 mg | ORAL_CAPSULE | Freq: Every day | ORAL | 0 refills | Status: DC

## 2020-05-09 MED ORDER — DICLOFENAC POTASSIUM 50 MG PO TABS: 50.0000 mg | ORAL_TABLET | Freq: Three times a day (TID) | ORAL | 0 refills | Status: DC

## 2020-05-09 NOTE — Patient Instructions (Signed)
Avoid overhead lifting and overhead use of the arms. Do not lift greater than 5 lbs. Adjust head rest in vehicle to prevent hyperextension if rear ended. Take extra precautions to avoid falling. Ice or heat for discomfort  Go to PT for ROM, stretching and cervical traction.

## 2020-05-09 NOTE — Progress Notes (Addendum)
Office Visit Note   Patient: Katherine Costa           Date of Birth: October 28, 1950           MRN: [id number] Visit Date: 05/09/2020              Requested by: [name], MD [place],  [place] [address] PCP: [name], MD   Assessment & Plan: Visit Diagnoses:  1. Spondylosis without myelopathy or radiculopathy, cervical region   2. Neck pain   3. Cervicalgia     Plan: Avoid overhead lifting and overhead use of the arms. Do not lift greater than 5 lbs. Adjust head rest in vehicle to prevent hyperextension if rear ended. Take extra precautions to avoid falling. Ice or heat for discomfort  Go to PT for ROM, stretching and cervical traction.   Follow-Up Instructions: No follow-ups on file.   Orders:  Orders Placed This Encounter  Procedures  . Ambulatory referral to Physical Therapy   Meds ordered this encounter  Medications  . diclofenac (CATAFLAM) 50 MG tablet    Sig: Take 1 tablet (50 mg total) by mouth 3 (three) times daily.    Dispense:  60 tablet    Refill:  0  . methocarbamol (ROBAXIN) 500 MG tablet    Sig: Take 1 tablet (500 mg total) by mouth every 8 (eight) hours as needed for muscle spasms.    Dispense:  40 tablet    Refill:  1  . gabapentin (NEURONTIN) 100 MG capsule    Sig: Take 1 capsule (100 mg total) by mouth at bedtime.    Dispense:  30 capsule    Refill:  0      Procedures: No procedures performed   Clinical Data: Findings:  Narrative & Impression CLINICAL DATA:  Chronic neck pain  EXAM: MRI CERVICAL SPINE WITHOUT CONTRAST  TECHNIQUE: Multiplanar, multisequence MR imaging of the cervical spine was performed. No intravenous contrast was administered.  COMPARISON:  Cervical spine radiographs 03/18/2020  FINDINGS: Alignment: Mild anterolisthesis C3-4, C5-6 and C7-T1.  Vertebrae: 1 cm hyperintensity in the left dens extending into the lateral atlanto axial joint compatible with  osteoarthritis. Associated prominent spurring bilaterally left greater than right.  Cord: Normal signal and morphology.  Posterior Fossa, vertebral arteries, paraspinal tissues: Negative  Disc levels:  C1-2: Degenerative change in the lateral atlanto axial joint with joint space narrowing and prominent spurring, left greater than right. There is associated bone marrow edema on the left extending into the left dens.  C2-3: Mild right foraminal narrowing due to facet degeneration and uncinate spurring.  C3-4: Moderate to severe right foraminal encroachment due to facet hypertrophy which is asymmetric on the right and uncinate spurring which is asymmetric on the right. Mild to moderate left foraminal stenosis. Mild spinal stenosis. Mild anterolisthesis.  C4-5: Asymmetric disc degeneration and spurring on the right. Asymmetric facet degeneration on the right. Moderate right foraminal stenosis. Left foramen patent.  C5-6: Bilateral facet degeneration. Disc degeneration with diffuse uncinate spurring. Mild foraminal narrowing bilaterally.  C6-7: Disc degeneration with diffuse uncinate spurring. Moderate to severe foraminal stenosis bilaterally right greater than left. Cord flattening with mild to moderate spinal stenosis  C7-T1: Bilateral facet degeneration. Moderate right foraminal stenosis due to spurring.  IMPRESSION: Multilevel degenerative change throughout the cervical spine with disc and facet degeneration and spurring causing spinal and foraminal stenosis at multiple levels as described above.   Electronically Signed   By: [name]  Carlis Abbott M.D.   On: 04/25/2020 10:54      Subjective: Chief Complaint  Patient presents with  . Neck - Pain, Follow-up    70 year old right handed female with history of upper posterior neck pain, non radiating below shoulders, but does radiate across the tops of shoulders. Pain began in December and she reports that it  is some better since last seen. Pain is primarily at the upper transverse posterior neck left side greater than right. She is not dropping items or  Having difficulty with standing or walking. Low back with some weakness but no arm numbness or tingling. No bowel or bladder difficulty. Did have a fall in December prior to her neck condition, was seen by Dr. Lorenz Costa  And did not have significant problem. She has had right neck pain with turning her head for prolong turning of the neck is pain. She is able to drive but had neck stiffness.   Review of Systems  Constitutional: Negative for activity change, appetite change, chills, diaphoresis, fatigue, fever and unexpected weight change.  HENT: Negative.  Negative for congestion, dental problem, drooling, ear discharge, ear pain, facial swelling, hearing loss, mouth sores, nosebleeds, postnasal drip, rhinorrhea, sinus pressure, sinus pain, sneezing, sore throat, tinnitus, trouble swallowing and voice change.   Eyes: Positive for discharge. Negative for photophobia, pain, redness, itching and visual disturbance.  Respiratory: Negative.  Negative for apnea, cough, choking, chest tightness, shortness of breath, wheezing and stridor.   Cardiovascular: Negative for chest pain, palpitations and leg swelling.  Gastrointestinal: Negative.  Negative for abdominal distention, abdominal pain, anal bleeding, blood in stool, constipation, diarrhea, nausea, rectal pain and vomiting.  Endocrine: Negative for cold intolerance, heat intolerance, polydipsia, polyphagia and polyuria.  Genitourinary: Negative.  Negative for difficulty urinating, dyspareunia, dysuria, enuresis, flank pain, frequency, genital sores, hematuria and menstrual problem.  Musculoskeletal: Positive for back pain, gait problem, neck pain and neck stiffness. Negative for arthralgias, joint swelling and myalgias.  Skin: Negative for color change, pallor, rash and wound.  Allergic/Immunologic:  Negative.  Negative for environmental allergies, food allergies and immunocompromised state.  Neurological: Negative for dizziness, tremors, seizures, syncope, facial asymmetry, speech difficulty, weakness, light-headedness, numbness and headaches.  Hematological: Negative.  Negative for adenopathy. Does not bruise/bleed easily.  Psychiatric/Behavioral: Negative.  Negative for agitation, behavioral problems, confusion, decreased concentration, dysphoric mood, hallucinations, self-injury, sleep disturbance and suicidal ideas. The patient is not nervous/anxious and is not hyperactive.      Objective: Vital Signs: BP (!) 142/79   Pulse 81   Ht 5\' 5"  (1.651 m)   Wt 250 lb (113.4 kg)   BMI 41.60 kg/m   Physical Exam Constitutional:      Appearance: She is well-developed.  HENT:     Head: Normocephalic and atraumatic.  Eyes:     Pupils: Pupils are equal, round, and reactive to light.  Pulmonary:     Effort: Pulmonary effort is normal.     Breath sounds: Normal breath sounds.  Abdominal:     General: Bowel sounds are normal.     Palpations: Abdomen is soft.  Musculoskeletal:     Cervical back: Normal range of motion and neck supple.     Lumbar back: Negative right straight leg raise test and negative left straight leg raise test.  Skin:    General: Skin is warm and dry.  Neurological:     Mental Status: She is alert and oriented to person, place, and time.  Psychiatric:  Behavior: Behavior normal.        Thought Content: Thought content normal.        Judgment: Judgment normal.     Back Exam   Tenderness  The patient is experiencing tenderness in the cervical.  Range of Motion  Extension: abnormal  Flexion: abnormal  Lateral bend right: abnormal  Lateral bend left: abnormal  Rotation right: abnormal  Rotation left: abnormal   Muscle Strength  Right Quadriceps:  5/5  Left Quadriceps:  5/5  Right Hamstrings:  5/5  Left Hamstrings:  5/5   Tests  Straight leg  raise right: negative Straight leg raise left: negative  Reflexes  Biceps: normal  Other  Toe walk: normal Heel walk: normal Sensation: normal Gait: normal  Erythema: no back redness Scars: absent  Comments:  Right triceps trace c/w 2+ left No focal motor deficit       Specialty Comments:  No specialty comments available.  Imaging: No results found.   PMFS History: Patient Active Problem List   Diagnosis Date Noted  . Urinary incontinence 06/24/2018  . Gastroesophageal reflux disease 02/11/2018  . Primary localized osteoarthritis of left knee 09/13/2015  . Pain in joint, shoulder region 06/01/2014  . Morbid obesity ([place]) 04/21/2013  . Encounter for long-term (current) use of other medications 09/02/2011  . LIVER DISORDER 03/17/2010  . ASYMPTOMATIC POSTMENOPAUSAL STATUS 05/21/2009  . HERPES LABIALIS 03/13/2009  . HYPERCHOLESTEROLEMIA 02/09/2008  . Type 1 diabetes mellitus ([place]) 08/24/2006  . Essential hypertension 08/24/2006   Past Medical History:  Diagnosis Date  . Arthritis   . ASYMPTOMATIC POSTMENOPAUSAL STATUS 05/21/2009  . DIABETES MELLITUS, TYPE I 08/24/2006  . Family history of colon cancer   . HERPES LABIALIS 03/13/2009  . HYPERCHOLESTEROLEMIA 02/09/2008  . Hyperlipidemia   . HYPERTENSION 08/24/2006  . Hypertension   . LIVER DISORDER 03/17/2010  . Tubulovillous adenoma of colon    2010     Family History  Problem Relation Age of Onset  . Cancer Mother        Colon Cancer, Pancreatic Cancer  . Colon cancer Mother   . Diabetes Sister   . Colon cancer Sister   . Cancer Sister        Breast Cancer  . Heart attack Father 33  . Diabetes Brother   . Heart disease Brother        related to agent orange  . Prostate cancer Maternal Grandfather   . Heart disease Paternal Grandmother   . Diabetes Brother   . Diabetes Daughter   . Kidney failure Daughter   . Heart disease Daughter 41    Past Surgical History:  Procedure Laterality Date  .  ABDOMINAL HYSTERECTOMY     endometriosis; menorrhagia  . ACHILLES TENDON REPAIR  2010   right heel  . CHOLECYSTECTOMY  2001  . TOTAL KNEE ARTHROPLASTY Left 09/13/2015   Procedure: TOTAL KNEE ARTHROPLASTY;  Surgeon: Earlie Server, MD;  Location: [facility];  Service: Orthopedics;  Laterality: Left;   Social History   Occupational History  . Occupation: Teacher, music  Tobacco Use  . Smoking status: Never Smoker  . Smokeless tobacco: Never Used  Substance and Sexual Activity  . Alcohol use: No    Alcohol/week: 0.0 standard drinks  . Drug use: No  . Sexual activity: Not on file

## 2020-05-30 ENCOUNTER — Telehealth: Payer: Self-pay | Admitting: Pharmacist

## 2020-05-30 NOTE — Chronic Care Management (AMB) (Signed)
Chronic Care Management Pharmacy Assistant   Name: Katherine Costa  MRN: [id number] DOB: 17-May-1950  Katherine Costa is an 70 y.o. year old female who presents for her initial CCM visit with the clinical pharmacist.  Reason for Encounter: Chart Prep for CPP visit on 05/31/20.   Conditions to be addressed/monitored: HTN and Hypercholesteremia and Type 1 diabetes mellitus.  Primary concerns for visit include: Hypertension, Hypercholesterolemia and Type 1 diabetes mellitus.  Recent office visits:  03/18/20 Micheline Rough MD (PCP) - presented to clinic for complete physical. Zoster vaccine 68mcg administered. Discontinued cinnamon, cyclobenzaprine, insulin aspart, insulin NPH and omega 3 fish oil. Return for pending blood work results.   Recent consult visits:  04/11/20 Benjiman Core PA-C (Orthopedic Surgery) - presented to clinic for neck pain. Ketorolac 30mg  injection administered. Patient started on methocarbamol 500mg  every 6 hours as needed. MRI of cervical spine ordered. Follow up in 3 weeks to review cervical MRI.  05/09/20 Basil Dess MD (Orthopedic Surgery) - presented to clinic for spondylosis and neck pain. Patient started on gabapentin 100mg  daily at bedtime, diclofenac potassium 50mg  three times daily. Changed methocarbamol 500mg  from every 6 hours to every 8 hours as needed. Placed referral to physical therapy. Follow up in 4 weeks.   05/13/20 Jerel Shepherd MD (General Surgery) - presented for post op exam. Lesions removed from face and scapula. No medication changes and follow up as needed.   Hospital visits:  None in previous 6 months   Fill History:  GABAPENTIN 100 MG CAP 05/09/2020 30DS   LISINOPRIL/HYDROCHLOROTHIAZIDE 10-12.5 MG TABS 04/07/2020 90DS   METHOCARBAMOL 500 MG TAB 05/09/2020 13DS     OMEPRAZOLE 40 MG CPDR 04/07/2020 90DS   ROSUVASTATIN CALCIUM 20 MG TABS 03/19/2020 90DS     Medications: Outpatient Encounter Medications as of 05/30/2020   Medication Sig  . Ascorbic Acid (VITAMIN C) 1000 MG tablet Take 1,000 mg by mouth daily.  Marland Kitchen aspirin 81 MG EC tablet Take 81 mg by mouth daily. Swallow whole.  . augmented betamethasone dipropionate (DIPROLENE) 0.05 % ointment Apply topically 3 (three) times daily as needed. rash  . clotrimazole-betamethasone (LOTRISONE) cream APPLY TOPICALLY THREE TIMES A DAY AS NEEDED FOR RASH.  . colestipol (MICRONIZED COLESTIPOL HCL) 1 g tablet Take 3 tablets (3 g total) by mouth daily.  . Continuous Blood Gluc Receiver (DEXCOM G6 RECEIVER) DEVI 1 Device by Does not apply route as needed.  . Continuous Blood Gluc Sensor (DEXCOM G6 SENSOR) MISC 1 Device by Does not apply route as needed.  . Continuous Blood Gluc Transmit (DEXCOM G6 TRANSMITTER) MISC 1 transmitter every 10 days  . diclofenac (CATAFLAM) 50 MG tablet Take 1 tablet (50 mg total) by mouth 3 (three) times daily.  Marland Kitchen gabapentin (NEURONTIN) 100 MG capsule Take 1 capsule (100 mg total) by mouth at bedtime.  Marland Kitchen glucose blood (ONE TOUCH ULTRA TEST) test strip 1 each by Other route 2 (two) times daily. And lancets 2/day 250.01  . insulin lispro (HUMALOG) 100 UNIT/ML injection Inject into the skin. Inject 5-7 units as needed  . lisinopril-hydrochlorothiazide (ZESTORETIC) 10-12.5 MG tablet Take 1 tablet by mouth daily.  . Magnesium 400 MG CAPS Take by mouth daily.  . methocarbamol (ROBAXIN) 500 MG tablet Take 1 tablet (500 mg total) by mouth every 6 (six) hours as needed for muscle spasms.  . methocarbamol (ROBAXIN) 500 MG tablet Take 1 tablet (500 mg total) by mouth every 8 (eight) hours as needed for muscle spasms.  Marland Kitchen omeprazole (  PRILOSEC) 40 MG capsule Take 1 capsule (40 mg total) by mouth daily.  Marland Kitchen oxybutynin (DITROPAN-XL) 5 MG 24 hr tablet TAKE ONE TABLET BY MOUTH AT BEDTIME  . rosuvastatin (CRESTOR) 20 MG tablet Take 1 tablet (20 mg total) by mouth daily.   No facility-administered encounter medications on file as of 05/30/2020.    Star Rating  Drugs:  Rosuvastatin 20mg  - last filled on 03/19/20 90DS at [facility] Lisinopril-hydrochlorothiazide 10/12.5mg  - last filled on 04/07/20 90DS at [facility] [id number]

## 2020-05-30 NOTE — Chronic Care Management (AMB) (Signed)
    Chronic Care Management Pharmacy Assistant   Name: Katherine Costa  MRN: [id number] DOB: Mar 14, 1950  Left voicemail reminding patient of appointment on 05/31/20 at [facility] by telephone with [name] the clinical pharmacist. Advised patient to have all medications and any blood pressure or blood glucose readings close by.   [place] [phone]

## 2020-05-31 ENCOUNTER — Ambulatory Visit (INDEPENDENT_AMBULATORY_CARE_PROVIDER_SITE_OTHER): Payer: Medicare Other | Admitting: Pharmacist

## 2020-05-31 DIAGNOSIS — E109 Type 1 diabetes mellitus without complications: Secondary | ICD-10-CM

## 2020-05-31 DIAGNOSIS — I10 Essential (primary) hypertension: Secondary | ICD-10-CM

## 2020-05-31 DIAGNOSIS — K219 Gastro-esophageal reflux disease without esophagitis: Secondary | ICD-10-CM

## 2020-05-31 NOTE — Progress Notes (Signed)
Chronic Care Management Pharmacy Note  06/07/2020 Name:  Katherine Costa MRN:  [id number] DOB:  02-26-1950  Subjective: [name] is an 70 y.o. year old female who is a primary patient of Koberlein, Steele Berg, MD.  The CCM team was consulted for assistance with disease management and care coordination needs.    Engaged with patient by telephone for initial visit in response to provider referral for pharmacy case management and/or care coordination services.   Consent to Services:  The patient was given the following information about Chronic Care Management services today, agreed to services, and gave verbal consent: 1. CCM service includes personalized support from designated clinical staff supervised by the primary care provider, including individualized plan of care and coordination with other care providers 2. 24/7 contact phone numbers for assistance for urgent and routine care needs. 3. Service will only be billed when office clinical staff spend 20 minutes or more in a month to coordinate care. 4. Only one practitioner may furnish and bill the service in a calendar month. 5.The patient may stop CCM services at any time (effective at the end of the month) by phone call to the office staff. 6. The patient will be responsible for cost sharing (co-pay) of up to 20% of the service fee (after annual deductible is met). Patient agreed to services and consent obtained.  Patient Care Team: [name], MD as PCP - General (Family Medicine) Maisie Fus, MD as Attending Physician (Obstetrics and Gynecology) Lorelle Gibbs, MD (Radiology) Viona Gilmore, [facility] as Pharmacist (Pharmacist)  Recent office visits: 03/18/20 Micheline Rough MD (PCP) - Presented to clinic for complete physical. Flu vaccine administered. Discontinued cinnamon, cyclobenzaprine, insulin aspart, insulin NPH and omega 3 fish oil. Return for pending blood work results.   Recent consult  visits: 05/13/20 Jerel Shepherd MD (General Surgery) - presented for post op exam. Lesions removed from face and scapula. No medication changes and follow up as needed.   05/09/20 Basil Dess MD (Orthopedic Surgery) - presented to clinic for spondylosis and neck pain. Patient started on gabapentin 177m daily at bedtime, diclofenac potassium 559mthree times daily. Changed methocarbamol 50082mrom every 6 hours to every 8 hours as needed. Placed referral to physical therapy. Follow up in 4 weeks.   04/23/20 MicJerel ShepherdD (surgical specialists): Presented for excision removal of multiple lesions on face.  04/11/20 JamBenjiman Core-C (Orthopedic Surgery) - presented to clinic for neck pain. Ketorolac 61m39mjection administered. Patient started on methocarbamol 500mg101mry 6 hours as needed. MRI of cervical spine ordered. Follow up in 3 weeks to review cervical MRI.   Hospital visits: None in previous 6 months  Objective:  Lab Results  Component Value Date   CREATININE 0.85 03/18/2020   BUN 17 03/18/2020   GFR 70.82 03/18/2020   GFRNONAA >60 09/14/2015   GFRAA >60 09/14/2015   NA 132 (L) 03/18/2020   K 4.9 03/18/2020   CALCIUM 9.8 03/18/2020   CO2 28 03/18/2020   GLUCOSE 312 (H) 03/18/2020    Lab Results  Component Value Date/Time   HGBA1C 10.6 (H) 03/18/2020 09:49 AM   HGBA1C 10.1 (A) 09/12/2019 08:58 AM   HGBA1C 11.1 (H) 02/27/2019 11:11 AM   FRUCTOSAMINE 337 (H) 09/29/2017 10:05 AM   FRUCTOSAMINE 339 (H) 06/29/2017 10:57 AM   GFR 70.82 03/18/2020 09:49 AM   GFR 75.04 06/29/2018 08:01 AM   MICROALBUR <0.7 03/18/2020 09:49 AM   MICROALBUR 0.6 09/28/2019 07:21 AM  Last diabetic Eye exam: No results found for: HMDIABEYEEXA  Last diabetic Foot exam: No results found for: HMDIABFOOTEX   Lab Results  Component Value Date   CHOL 253 (H) 03/18/2020   HDL 83.80 03/18/2020   LDLCALC 146 (H) 03/18/2020   LDLDIRECT 110.6 09/02/2011   TRIG 115.0 03/18/2020   CHOLHDL 3  03/18/2020    Hepatic Function Latest Ref Rng & Units 03/18/2020 09/28/2019 06/29/2018  Total Protein 6.0 - 8.3 g/dL 7.0 6.7 6.2  Albumin 3.5 - 5.2 g/dL 3.8 - 3.5  AST 0 - 37 U/L 16 33 17  ALT 0 - 35 U/L _0 Alk Phosphatase 39 - 117 U/L 123(H) - 86  Total Bilirubin 0.2 - 1.2 mg/dL 0.6 0.5 0.8  Bilirubin, Direct 0.0 - 0.3 mg/dL - - -    Lab Results  Component Value Date/Time   TSH 1.90 09/28/2019 07:21 AM   TSH 1.31 06/29/2018 08:01 AM    CBC Latest Ref Rng & Units 04/11/2020 03/18/2020 09/28/2019  WBC 3.8 - 10.8 Thousand/uL 5.8 5.4 7.5  Hemoglobin 11.7 - 15.5 g/dL 14.2 14.5 15.2  Hematocrit 35.0 - 45.0 % 43.7 43.4 46.5(H)  Platelets 140 - 400 Thousand/uL 282 307.0 288    No results found for: VD25OH  Clinical ASCVD: No  The 10-year ASCVD risk score Mikey Bussing DC Jr., et al., 2013) is: 22%   Values used to calculate the score:     Age: 40 years     Sex: Female     Is Non-Hispanic African American: No     Diabetic: Yes     Tobacco smoker: No     Systolic Blood Pressure: 382 mmHg     Is BP treated: Yes     HDL Cholesterol: 83.8 mg/dL     Total Cholesterol: 253 mg/dL    Depression screen [facility] 2/9 03/18/2020 02/04/2018  Decreased Interest 0 0  Down, Depressed, Hopeless 0 0  PHQ - 2 Score 0 0      Social History   Tobacco Use  Smoking Status Never Smoker  Smokeless Tobacco Never Used   BP Readings from Last 3 Encounters:  05/09/20 (!) 142/79  03/18/20 132/78  09/13/19 120/62   Pulse Readings from Last 3 Encounters:  05/09/20 81  03/18/20 82  09/13/19 82   Wt Readings from Last 3 Encounters:  05/09/20 250 lb (113.4 kg)  04/11/20 250 lb (113.4 kg)  03/18/20 248 lb (112.5 kg)   BMI Readings from Last 3 Encounters:  05/09/20 41.60 kg/m  04/11/20 41.60 kg/m  03/18/20 42.57 kg/m    Assessment/Interventions: Review of patient past medical history, allergies, medications, health status, including review of consultants reports, laboratory and other test data, was  performed as part of comprehensive evaluation and provision of chronic care management services.   SDOH:  (Social Determinants of Health) assessments and interventions performed: Yes SDOH Interventions   Flowsheet Row Most Recent Value  SDOH Interventions   Financial Strain Interventions Other (Comment)  [working on patient assistance for insulin]  Transportation Interventions Intervention Not Indicated     SDOH Screenings   Alcohol Screen: Not on file  Depression (PHQ2-9): Low Risk   . PHQ-2 Score: 0  Financial Resource Strain: Medium Risk  . Difficulty of Paying Living Expenses: Somewhat hard  Food Insecurity: Not on file  Housing: Not on file  Physical Activity: Not on file  Social Connections: Not on file  Stress: Not on file  Tobacco Use: Low Risk   .  Smoking Tobacco Use: Never Smoker  . Smokeless Tobacco Use: Never Used  Transportation Needs: No Transportation Needs  . Lack of Transportation (Medical): No  . Lack of Transportation (Non-Medical): No   Patient lives with her husband and this is their busiest time of the year as they do some mowing for elderly people. both Patient and her husband are retired and they do their own yardwork and gardening.  They share the cooking and cleaning and eat out more than they eat at home. They eat take out and fast food and don't follow a particular diet. They are trying to eat less carbs and bread overall and have been drinking unsweet tea, diet soda, water, and coffee. Patient has been trying to limit sweets as well. They eat all meat and don't eat much fruit, but do have vegetables every day just not with every meal.  Patient does not do any structured exercise but uses push mowers and riding mowers when mowing for others. Patient and her husband have 3 kids, 7 grandkids and 5 great-grandkids and they keep her busy.  Patient sleeps about 6-7 hours sleep per night and deniesproblems falling asleep or staying asleep. Patient feels pretty  rested during the day.  Patient reports her biggest problems with medications is cost, particularly with insulin. She switched to NPH at [facility] because it's cheaper and it has not been working well.  CCM Care Plan  Allergies  Allergen Reactions  . Pioglitazone     REACTION: Edema    Medications Reviewed Today    Reviewed by Viona Gilmore, [facility] (Pharmacist) on 06/07/20 at 15  Med List Status: <None>  Medication Order Taking? Sig Documenting Provider Last Dose Status Informant  Ascorbic Acid (VITAMIN C) 1000 MG tablet [id number] Yes Take 1,000 mg by mouth daily. [provider] Taking Active   aspirin 81 MG EC tablet [id number] Yes Take 81 mg by mouth daily. Swallow whole. [provider] Taking Active   augmented betamethasone dipropionate (DIPROLENE) 0.05 % ointment [id number] Yes Apply topically 3 (three) times daily as needed. rash Renato Shin, MD Taking Active   clotrimazole-betamethasone Donalynn Furlong) cream [id number] Yes APPLY TOPICALLY THREE TIMES A DAY AS NEEDED FOR RASH. Renato Shin, MD Taking Active Self  Continuous Blood Gluc Receiver ([place]) DEVI [id number]  1 Device by Does not apply route as needed. [name], MD  Active   Continuous Blood Gluc Sensor (DEXCOM G6 SENSOR) MISC [id number]  1 Device by Does not apply route as needed. [name], MD  Active   Continuous Blood Gluc Transmit (DEXCOM G6 TRANSMITTER) MISC [id number]  1 transmitter every 10 days Koberlein, Steele Berg, MD  Active   diclofenac (CATAFLAM) 50 MG tablet [id number] Yes Take 1 tablet (50 mg total) by mouth 3 (three) times daily. Jessy Oto, MD Taking Active   gabapentin (NEURONTIN) 100 MG capsule [id number] Yes Take 1 capsule (100 mg total) by mouth at bedtime. Jessy Oto, MD Taking Active   glucose blood (ONE TOUCH ULTRA TEST) test strip [id number]  1 each by Other route 2 (two) times daily. And lancets 2/day 250.01 Renato Shin, MD  Active Self  insulin  lispro (HUMALOG) 100 UNIT/ML injection [id number]  Inject into the skin. Inject 5-7 units as needed [provider]  Active   insulin NPH Human (NOVOLIN N) 100 UNIT/ML injection [id number] Yes Inject 70 Units into the skin. 70 units in the morning and 30 units in the evening [provider] Taking  Active   lisinopril-hydrochlorothiazide (ZESTORETIC) 10-12.5 MG tablet [id number] Yes Take 1 tablet by mouth daily. [name], MD Taking Active   Magnesium 400 MG CAPS [id number] Yes Take by mouth daily. [provider] Taking Active   methocarbamol (ROBAXIN) 500 MG tablet [id number]  Take 1 tablet (500 mg total) by mouth every 6 (six) hours as needed for muscle spasms. Lanae Crumbly, PA-C  Active   methocarbamol (ROBAXIN) 500 MG tablet [id number] Yes Take 1 tablet (500 mg total) by mouth every 8 (eight) hours as needed for muscle spasms. Jessy Oto, MD Taking Active   omeprazole (PRILOSEC) 40 MG capsule [id number] Yes Take 1 capsule (40 mg total) by mouth daily. [name], MD Taking Active   oxybutynin (DITROPAN-XL) 5 MG 24 hr tablet [id number] Yes TAKE ONE TABLET BY MOUTH AT BEDTIME Koberlein, Junell C, MD Taking Active   rosuvastatin (CRESTOR) 20 MG tablet [id number] Yes Take 1 tablet (20 mg total) by mouth daily. [name], MD Taking Active           Patient Active Problem List   Diagnosis Date Noted  . Urinary incontinence 06/24/2018  . Gastroesophageal reflux disease 02/11/2018  . Primary localized osteoarthritis of left knee 09/13/2015  . Pain in joint, shoulder region 06/01/2014  . Morbid obesity ([place]) 04/21/2013  . Encounter for long-term (current) use of other medications 09/02/2011  . LIVER DISORDER 03/17/2010  . ASYMPTOMATIC POSTMENOPAUSAL STATUS 05/21/2009  . HERPES LABIALIS 03/13/2009  . HYPERCHOLESTEROLEMIA 02/09/2008  . Type 1 diabetes mellitus ([place]) 08/24/2006  . Essential hypertension 08/24/2006    Immunization History   Administered Date(s) Administered  . Fluad Quad(high Dose 65+) 10/14/2018, 03/18/2020  . Influenza Whole 10/26/2008, 12/06/2009, 10/02/2010  . Influenza, High Dose Seasonal PF 10/29/2017  . Influenza,inj,Quad PF,6+ Mos 10/31/2013  . Influenza-Unspecified 11/16/2015, 11/17/2016  . PFIZER(Purple Top)SARS-COV-2 Vaccination 02/26/2019, 03/19/2019, 01/04/2020  . Pneumococcal Conjugate-13 09/13/2019  . Pneumococcal Polysaccharide-23 05/21/2009  . Tdap 06/29/2017  . Zoster 12/11/2014    Conditions to be addressed/monitored:  Hypertension, Hyperlipidemia, Diabetes, GERD, Overactive Bladder and Pain  Care Plan : [place]  Updates made by Viona Gilmore, [place] since 06/07/2020 12:00 AM    Problem: Problem: Hypertension, Hyperlipidemia, Diabetes, GERD, Overactive Bladder and Pain     Long-Range Goal: Patient-Specific Goal   Start Date: 05/31/2020  Expected End Date: 05/31/2021  This Visit's Progress: On track  Priority: High  Note:   Current Barriers:  . Unable to independently afford treatment regimen . Unable to independently monitor therapeutic efficacy . Unable to achieve control of cholesterol and diabetes   Pharmacist Clinical Goal(s):  Marland Kitchen Patient will verbalize ability to afford treatment regimen . achieve adherence to monitoring guidelines and medication adherence to achieve therapeutic efficacy . achieve control of diabetes as evidenced by A1c  through collaboration with PharmD and provider.   Interventions: . 1:1 collaboration with [name], MD regarding development and update of comprehensive plan of care as evidenced by provider attestation and co-signature . Inter-disciplinary care team collaboration (see longitudinal plan of care) . Comprehensive medication review performed; medication list updated in electronic medical record  Hypertension (BP goal <130/80) -Uncontrolled -Current treatment: . Lisinopril - HCTZ 10-12.5 mg 1 tablet  daily -Medications previously tried: none  -Current home readings: does not check -Current dietary habits: does add salt to food -Current exercise habits: yardwork but no formal exercsie -Denies hypotensive/hypertensive symptoms -Educated on Daily salt intake goal < 2300 mg; Exercise goal  of 150 minutes per week; Importance of home blood pressure monitoring; Proper BP monitoring technique; -Counseled to monitor BP at home weekly, document, and provide log at future appointments -Counseled on diet and exercise extensively Recommended to continue current medication Recommended lower sodium salt substitute  Hyperlipidemia: (LDL goal < 100) -Uncontrolled -Current treatment: . Simvastatin 1 tablet daily -Medications previously tried: none  -Current dietary patterns: eating take out fast food which includes fried foods; uses canola oil when cooking -Current exercise habits: yardwork but no formal exercise -Educated on Cholesterol goals;  Benefits of statin for ASCVD risk reduction; Importance of limiting foods high in cholesterol; Exercise goal of 150 minutes per week; -Counseled on diet and exercise extensively Recommended for patient to switch over to rosuvastatin and start taking daily  Diabetes (A1c goal <7%) -Uncontrolled -Current medications: . Humalog 100 unit/mL inject 5-10 units as needed  . Insulin NPH 70 units in the morning and 30 units in the evening -Medications previously tried: pioglitazone (edema) -Current home glucose readings: downloaded CGM data . fasting glucose: morning were occasionally low; mostly above 80-130 . post prandial glucose: pattern of daytime highs especially during 9am-3pm -Denies hypoglycemic/hyperglycemic symptoms -Current meal patterns:  . breakfast: 2 eggs, 2 strips of bacon, small bowl of grits or hash browns & gravy . lunch: chicken nuggets or hot dog or cheeseburger or fish sandwich or nothing . dinner: biggest meal: meat, vegetable,  creamed potatoes or mac n'cheese . snacks: did not discuss . drinks: diet drinks -Current exercise: yardwork and lawnmowing -Educated on A1c and blood sugar goals; Exercise goal of 150 minutes per week; Continuous glucose monitoring; Carbohydrate counting and/or plate method -Counseled to check feet daily and get yearly eye exams -Counseled on diet and exercise extensively Collaborated with PCP and switching to [place] for better day time coverage. Will decrease dose to 60 units daily.  GERD (Goal: minimize symptoms of heartburn) -Controlled -Current treatment  . Omeprazole 40 mg 1 capsule daily -Medications previously tried: none  -Counseled on non-pharmacologic management of symptoms such as elevating the head of your bed, avoiding eating 2-3 hours before bed, avoiding triggering foods such as acidic, spicy, or fatty foods, eating smaller meals, and wearing clothes that are loose around the waist  Overactive bladder (Goal: minimize symptoms) -Controlled -Current treatment  . Oxybutynin XL 5 mg 1 tablet daily at bedtime as needed (taking in the morning sometimes) -Medications previously tried: none  - Patient only takes as needed  Pain (Goal: minimize symptoms) -Controlled -Current treatment  . Diclofenac 50 mg 1 tablet three times daily - was taking twice a day . Gabapentin 100 mg 1 capsule at bedtime as needed  . Methocarbamol 500 mg 1 tablet every 8 hours as needed - takes it in AM or sometimes in PM -Medications previously tried: none  -Recommended to continue current medication Patient just started physical therapy this week.   Health Maintenance -Vaccine gaps: shingrix, COVID booster -Current therapy:  Marland Kitchen Vitamin C 1000 mg 1 tablet daily . Aspirin 81 mg 1 tablet daily  . Betamethasone 0.05% ointment - [place] . Clotrimazole-betamethasone - yeast, during the summer . Magnesium 400 mg 1 capsule daily -Educated on Cost vs benefit of each product must be carefully  weighed by individual consumer -Patient is satisfied with current therapy and denies issues -Recommended to continue current medication  Patient Goals/Self-Care Activities . Patient will:  - take medications as prescribed check glucose with Dexcom, document, and provide at future appointments check blood pressure weekly,  document, and provide at future appointments target a minimum of 150 minutes of moderate intensity exercise weekly  Follow Up Plan: The care management team will reach out to the patient again over the next 30 days.        Medication Assistance: Application for [place] and Nov[employer]  medication assistance program. in process.  Anticipated assistance start date 07/08/20.  See plan of care for additional detail.  Patient's preferred pharmacy is:  [place], [place] [address] Phone: [phone] Fax: [facility], [address], [address] [place], [place] [address] Phone: [phone] Fax: [phone], [facility] (New Address) - [place], [place] AT Previously: [address], [address] [place] [address] Phone: [phone] Fax: [phone]  Uses pill box? Yes - once a week Pt endorses 100% compliance  Can move rosuvastatin to morning time  We discussed: Current pharmacy is preferred with insurance plan and patient is satisfied with pharmacy services Patient decided to: Continue current medication management strategy  Care Plan and Follow Up Patient Decision:  Patient agrees to Care Plan and Follow-up.  Plan: The care management team will reach out to the patient again over the next 30 days.  [name], PharmD [facility] Clinical Pharmacist [place] [facility] [place]

## 2020-06-07 NOTE — Patient Instructions (Addendum)
Hi Katherine Costa,  It was great to get to meet you over the telephone! Below is a summary of some of the topics we discussed.   Please reach out to me if you have any questions or need anything before our follow up!  Best, Maddie  [name], PharmD, [facility] [place] [phone]  Visit Information  Goals Addressed            This Visit's Progress   . Monitor and Manage My Blood Sugar-Diabetes Type 1       Timeframe:  Long-Range Goal Priority:  High Start Date:                             Expected End Date:                       Follow Up Date 6//5/22    - check blood sugar at prescribed times - enter blood sugar readings and medication or insulin into daily log - take the blood sugar meter to all doctor visits    Why is this important?    Checking your blood sugar at home helps to keep it from getting very high or very low.   Writing the results in a diary or log helps the doctor know how to care for you.   Your blood sugar log should have the time, the date and the results.   Also, write down the amount of insulin or other medicine you take.   Other information like what you ate, exercise done and how you were feeling will also be helpful..     Notes:       Patient Care Plan: CCM Pharmacy Care Plan    Problem Identified: Problem: Hypertension, Hyperlipidemia, Diabetes, GERD, Overactive Bladder and Pain     Long-Range Goal: Patient-Specific Goal   Start Date: 05/31/2020  Expected End Date: 05/31/2021  This Visit's Progress: On track  Priority: High  Note:   Current Barriers:  . Unable to independently afford treatment regimen . Unable to independently monitor therapeutic efficacy . Unable to achieve control of cholesterol and diabetes   Pharmacist Clinical Goal(s):  Marland Kitchen Patient will verbalize ability to afford treatment regimen . achieve adherence to monitoring guidelines and medication adherence to achieve  therapeutic efficacy . achieve control of diabetes as evidenced by A1c  through collaboration with PharmD and provider.   Interventions: . 1:1 collaboration with [name], MD regarding development and update of comprehensive plan of care as evidenced by provider attestation and co-signature . Inter-disciplinary care team collaboration (see longitudinal plan of care) . Comprehensive medication review performed; medication list updated in electronic medical record  Hypertension (BP goal <130/80) -Uncontrolled -Current treatment: . Lisinopril - HCTZ 10-12.5 mg 1 tablet daily -Medications previously tried: none  -Current home readings: does not check -Current dietary habits: does add salt to food -Current exercise habits: yardwork but no formal exercsie -Denies hypotensive/hypertensive symptoms -Educated on Daily salt intake goal < 2300 mg; Exercise goal of 150 minutes per week; Importance of home blood pressure monitoring; Proper BP monitoring technique; -Counseled to monitor BP at home weekly, document, and provide log at future appointments -Counseled on diet and exercise extensively Recommended to continue current medication Recommended lower sodium salt substitute  Hyperlipidemia: (LDL goal < 100) -Uncontrolled -Current treatment: . Simvastatin 1 tablet daily -Medications previously tried: none  -Current dietary patterns: eating take out fast  food which includes fried foods; uses canola oil when cooking -Current exercise habits: yardwork but no formal exercise -Educated on Cholesterol goals;  Benefits of statin for ASCVD risk reduction; Importance of limiting foods high in cholesterol; Exercise goal of 150 minutes per week; -Counseled on diet and exercise extensively Recommended for patient to switch over to rosuvastatin and start taking daily  Diabetes (A1c goal <7%) -Uncontrolled -Current medications: . Humalog 100 unit/mL inject 5-10 units as needed   . Insulin NPH 70 units in the morning and 30 units in the evening -Medications previously tried: pioglitazone (edema) -Current home glucose readings: downloaded CGM data . fasting glucose: morning were occasionally low; mostly above 80-130 . post prandial glucose: pattern of daytime highs especially during 9am-3pm -Denies hypoglycemic/hyperglycemic symptoms -Current meal patterns:  . breakfast: 2 eggs, 2 strips of bacon, small bowl of grits or hash browns & gravy . lunch: chicken nuggets or hot dog or cheeseburger or fish sandwich or nothing . dinner: biggest meal: meat, vegetable, creamed potatoes or mac n'cheese . snacks: did not discuss . drinks: diet drinks -Current exercise: yardwork and lawnmowing -Educated on A1c and blood sugar goals; Exercise goal of 150 minutes per week; Continuous glucose monitoring; Carbohydrate counting and/or plate method -Counseled to check feet daily and get yearly eye exams -Counseled on diet and exercise extensively Collaborated with PCP and switching to [place] for better day time coverage. Will decrease dose to 60 units daily.  GERD (Goal: minimize symptoms of heartburn) -Controlled -Current treatment  . Omeprazole 40 mg 1 capsule daily -Medications previously tried: none  -Counseled on non-pharmacologic management of symptoms such as elevating the head of your bed, avoiding eating 2-3 hours before bed, avoiding triggering foods such as acidic, spicy, or fatty foods, eating smaller meals, and wearing clothes that are loose around the waist  Overactive bladder (Goal: minimize symptoms) -Controlled -Current treatment  . Oxybutynin XL 5 mg 1 tablet daily at bedtime as needed (taking in the morning sometimes) -Medications previously tried: none  - Patient only takes as needed  Pain (Goal: minimize symptoms) -Controlled -Current treatment  . Diclofenac 50 mg 1 tablet three times daily - was taking twice a day . Gabapentin 100 mg 1 capsule at  bedtime as needed  . Methocarbamol 500 mg 1 tablet every 8 hours as needed - takes it in AM or sometimes in PM -Medications previously tried: none  -Recommended to continue current medication Patient just started physical therapy this week.   Health Maintenance -Vaccine gaps: shingrix, COVID booster -Current therapy:  Marland Kitchen Vitamin C 1000 mg 1 tablet daily . Aspirin 81 mg 1 tablet daily  . Betamethasone 0.05% ointment - poison oak . Clotrimazole-betamethasone - yeast, during the summer . Magnesium 400 mg 1 capsule daily -Educated on Cost vs benefit of each product must be carefully weighed by individual consumer -Patient is satisfied with current therapy and denies issues -Recommended to continue current medication  Patient Goals/Self-Care Activities . Patient will:  - take medications as prescribed check glucose with Dexcom, document, and provide at future appointments check blood pressure weekly, document, and provide at future appointments target a minimum of 150 minutes of moderate intensity exercise weekly  Follow Up Plan: The care management team will reach out to the patient again over the next 30 days.       Ms. Hlavaty was given information about Chronic Care Management services today including:  1. CCM service includes personalized support from designated clinical staff supervised by her physician, including  individualized plan of care and coordination with other care providers 2. 24/7 contact phone numbers for assistance for urgent and routine care needs. 3. Standard insurance, coinsurance, copays and deductibles apply for chronic care management only during months in which we provide at least 20 minutes of these services. Most insurances cover these services at 100%, however patients may be responsible for any copay, coinsurance and/or deductible if applicable. This service may help you avoid the need for more expensive face-to-face services. 4. Only one practitioner may  furnish and bill the service in a calendar month. 5. The patient may stop CCM services at any time (effective at the end of the month) by phone call to the office staff.  Patient agreed to services and verbal consent obtained.   Print copy of patient instructions, educational materials, and care plan provided in person. The pharmacy team will reach out to the patient again over the next 30 days.   Viona Gilmore, RPH  Diabetes Mellitus and Nutrition, Adult When you have diabetes, or diabetes mellitus, it is very important to have healthy eating habits because your blood sugar (glucose) levels are greatly affected by what you eat and drink. Eating healthy foods in the right amounts, at about the same times every day, can help you:  Control your blood glucose.  Lower your risk of heart disease.  Improve your blood pressure.  Reach or maintain a healthy weight. What can affect my meal plan? Every person with diabetes is different, and each person has different needs for a meal plan. Your health care provider may recommend that you work with a dietitian to make a meal plan that is best for you. Your meal plan may vary depending on factors such as:  The calories you need.  The medicines you take.  Your weight.  Your blood glucose, blood pressure, and cholesterol levels.  Your activity level.  Other health conditions you have, such as heart or kidney disease. How do carbohydrates affect me? Carbohydrates, also called carbs, affect your blood glucose level more than any other type of food. Eating carbs naturally raises the amount of glucose in your blood. Carb counting is a method for keeping track of how many carbs you eat. Counting carbs is important to keep your blood glucose at a healthy level, especially if you use insulin or take certain oral diabetes medicines. It is important to know how many carbs you can safely have in each meal. This is different for every person. Your  dietitian can help you calculate how many carbs you should have at each meal and for each snack. How does alcohol affect me? Alcohol can cause a sudden decrease in blood glucose (hypoglycemia), especially if you use insulin or take certain oral diabetes medicines. Hypoglycemia can be a life-threatening condition. Symptoms of hypoglycemia, such as sleepiness, dizziness, and confusion, are similar to symptoms of having too much alcohol.  Do not drink alcohol if: ? Your health care provider tells you not to drink. ? You are pregnant, may be pregnant, or are planning to become pregnant.  If you drink alcohol: ? Do not drink on an empty stomach. ? Limit how much you use to:  0-1 drink a day for women.  0-2 drinks a day for men. ? Be aware of how much alcohol is in your drink. In the [place] one drink equals one 12 oz bottle of beer (355 mL), one 5 oz glass of wine (148 mL), or one 1 oz glass of hard  liquor (44 mL). ? Keep yourself hydrated with water, diet soda, or unsweetened iced tea.  Keep in mind that regular soda, juice, and other mixers may contain a lot of sugar and must be counted as carbs. What are tips for following this plan? Reading food labels  Start by checking the serving size on the "Nutrition Facts" label of packaged foods and drinks. The amount of calories, carbs, fats, and other nutrients listed on the label is based on one serving of the item. Many items contain more than one serving per package.  Check the total grams (g) of carbs in one serving. You can calculate the number of servings of carbs in one serving by dividing the total carbs by 15. For example, if a food has 30 g of total carbs per serving, it would be equal to 2 servings of carbs.  Check the number of grams (g) of saturated fats and trans fats in one serving. Choose foods that have a low amount or none of these fats.  Check the number of milligrams (mg) of salt (sodium) in one serving. Most people should limit  total sodium intake to less than 2,300 mg per day.  Always check the nutrition information of foods labeled as "low-fat" or "nonfat." These foods may be higher in added sugar or refined carbs and should be avoided.  Talk to your dietitian to identify your daily goals for nutrients listed on the label. Shopping  Avoid buying canned, pre-made, or processed foods. These foods tend to be high in fat, sodium, and added sugar.  Shop around the outside edge of the grocery store. This is where you will most often find fresh fruits and vegetables, bulk grains, fresh meats, and fresh dairy. Cooking  Use low-heat cooking methods, such as baking, instead of high-heat cooking methods like deep frying.  Cook using healthy oils, such as olive, canola, or sunflower oil.  Avoid cooking with butter, cream, or high-fat meats. Meal planning  Eat meals and snacks regularly, preferably at the same times every day. Avoid going long periods of time without eating.  Eat foods that are high in fiber, such as fresh fruits, vegetables, beans, and whole grains. Talk with your dietitian about how many servings of carbs you can eat at each meal.  Eat 4-6 oz (112-168 g) of lean protein each day, such as lean meat, chicken, fish, eggs, or tofu. One ounce (oz) of lean protein is equal to: ? 1 oz (28 g) of meat, chicken, or fish. ? 1 egg. ?  cup (62 g) of tofu.  Eat some foods each day that contain healthy fats, such as avocado, nuts, seeds, and fish.   What foods should I eat? Fruits Berries. Apples. Oranges. Peaches. Apricots. Plums. Grapes. Mango. Papaya. Pomegranate. Kiwi. Cherries. Vegetables Lettuce. Spinach. Leafy greens, including kale, chard, collard greens, and mustard greens. Beets. Cauliflower. Cabbage. Broccoli. Carrots. Green beans. Tomatoes. Peppers. Onions. Cucumbers. Brussels sprouts. Grains Whole grains, such as whole-wheat or whole-grain bread, crackers, tortillas, cereal, and pasta. Unsweetened  oatmeal. Quinoa. Brown or wild rice. Meats and other proteins Seafood. Poultry without skin. Lean cuts of poultry and beef. Tofu. Nuts. Seeds. Dairy Low-fat or fat-free dairy products such as milk, yogurt, and cheese. The items listed above may not be a complete list of foods and beverages you can eat. Contact a dietitian for more information. What foods should I avoid? Fruits Fruits canned with syrup. Vegetables Canned vegetables. Frozen vegetables with butter or cream sauce. Grains Refined white  flour and flour products such as bread, pasta, snack foods, and cereals. Avoid all processed foods. Meats and other proteins Fatty cuts of meat. Poultry with skin. Breaded or fried meats. Processed meat. Avoid saturated fats. Dairy Full-fat yogurt, cheese, or milk. Beverages Sweetened drinks, such as soda or iced tea. The items listed above may not be a complete list of foods and beverages you should avoid. Contact a dietitian for more information. Questions to ask a health care provider  Do I need to meet with a diabetes educator?  Do I need to meet with a dietitian?  What number can I call if I have questions?  When are the best times to check my blood glucose? Where to find more information:  American Diabetes Association: diabetes.org  Academy of Nutrition and Dietetics: www.eatright.[employer] of Diabetes and Digestive and Kidney Diseases: [employer].dk  Association of Diabetes Care and Education Specialists: www.diabeteseducator.org Summary  It is important to have healthy eating habits because your blood sugar (glucose) levels are greatly affected by what you eat and drink.  A healthy meal plan will help you control your blood glucose and maintain a healthy lifestyle.  Your health care provider may recommend that you work with a dietitian to make a meal plan that is best for you.  Keep in mind that carbohydrates (carbs) and alcohol have immediate effects  on your blood glucose levels. It is important to count carbs and to use alcohol carefully. This information is not intended to replace advice given to you by your health care provider. Make sure you discuss any questions you have with your health care provider. Document Revised: 12/20/2018 Document Reviewed: 12/20/2018 Elsevier Patient Education  2021 [place].  High Cholesterol  High cholesterol is a condition in which the blood has high levels of a white, waxy substance similar to fat (cholesterol). The liver makes all the cholesterol that the body needs. The human body needs small amounts of cholesterol to help build cells. A person gets extra or excess cholesterol from the food that he or she eats. The blood carries cholesterol from the liver to the rest of the body. If you have high cholesterol, deposits (plaques) may build up on the walls of your arteries. Arteries are the blood vessels that carry blood away from your heart. These plaques make the arteries narrow and stiff. Cholesterol plaques increase your risk for heart attack and stroke. Work with your health care provider to keep your cholesterol levels in a healthy range. What increases the risk? The following factors may make you more likely to develop this condition:  Eating foods that are high in animal fat (saturated fat) or cholesterol.  Being overweight.  Not getting enough exercise.  A family history of high cholesterol (familial hypercholesterolemia).  Use of tobacco products.  Having diabetes. What are the signs or symptoms? There are no symptoms of this condition. How is this diagnosed? This condition may be diagnosed based on the results of a blood test.  If you are older than 68 years of age, your health care provider may check your cholesterol levels every 4-6 years.  You may be checked more often if you have high cholesterol or other risk factors for heart disease. The blood test for cholesterol  measures:  "Bad" cholesterol, or LDL cholesterol. This is the main type of cholesterol that causes heart disease. The desired level is less than 100 mg/dL.  "Good" cholesterol, or HDL cholesterol. HDL helps protect against heart disease by  cleaning the arteries and carrying the LDL to the liver for processing. The desired level for HDL is 60 mg/dL or higher.  Triglycerides. These are fats that your body can store or burn for energy. The desired level is less than 150 mg/dL.  Total cholesterol. This measures the total amount of cholesterol in your blood and includes LDL, HDL, and triglycerides. The desired level is less than 200 mg/dL. How is this treated? This condition may be treated with:  Diet changes. You may be asked to eat foods that have more fiber and less saturated fats or added sugar.  Lifestyle changes. These may include regular exercise, maintaining a healthy weight, and quitting use of tobacco products.  Medicines. These are given when diet and lifestyle changes have not worked. You may be prescribed a statin medicine to help lower your cholesterol levels. Follow these instructions at home: Eating and drinking  Eat a healthy, balanced diet. This diet includes: ? Daily servings of a variety of fresh, frozen, or canned fruits and vegetables. ? Daily servings of whole grain foods that are rich in fiber. ? Foods that are low in saturated fats and trans fats. These include poultry and fish without skin, lean cuts of meat, and low-fat dairy products. ? A variety of fish, especially oily fish that contain omega-3 fatty acids. Aim to eat fish at least 2 times a week.  Avoid foods and drinks that have added sugar.  Use healthy cooking methods, such as roasting, grilling, broiling, baking, poaching, steaming, and stir-frying. Do not fry your food except for stir-frying.   Lifestyle  Get regular exercise. Aim to exercise for a total of 150 minutes a week. Increase your activity level  by doing activities such as gardening, walking, and taking the stairs.  Do not use any products that contain nicotine or tobacco, such as cigarettes, e-cigarettes, and chewing tobacco. If you need help quitting, ask your health care provider.   General instructions  Take over-the-counter and prescription medicines only as told by your health care provider.  Keep all follow-up visits as told by your health care provider. This is important. Where to find more information  American Heart Association: www.heart.org  National Heart, Lung, and Blood Institute: https://wilson-eaton.com/ Contact a health care provider if:  You have trouble achieving or maintaining a healthy diet or weight.  You are starting an exercise program.  You are unable to stop smoking. Get help right away if:  You have chest pain.  You have trouble breathing.  You have any symptoms of a stroke. "BE FAST" is an easy way to remember the main warning signs of a stroke: ? B - Balance. Signs are dizziness, sudden trouble walking, or loss of balance. ? E - Eyes. Signs are trouble seeing or a sudden change in vision. ? F - Face. Signs are sudden weakness or numbness of the face, or the face or eyelid drooping on one side. ? A - Arms. Signs are weakness or numbness in an arm. This happens suddenly and usually on one side of the body. ? S - Speech. Signs are sudden trouble speaking, slurred speech, or trouble understanding what people say. ? T - Time. Time to call emergency services. Write down what time symptoms started.  You have other signs of a stroke, such as: ? A sudden, severe headache with no known cause. ? Nausea or vomiting. ? Seizure. These symptoms may represent a serious problem that is an emergency. Do not wait to see if  the symptoms will go away. Get medical help right away. Call your local emergency services (911 in the [place] Do not drive yourself to the hospital. Summary  Cholesterol plaques increase your  risk for heart attack and stroke. Work with your health care provider to keep your cholesterol levels in a healthy range.  Eat a healthy, balanced diet, get regular exercise, and maintain a healthy weight.  Do not use any products that contain nicotine or tobacco, such as cigarettes, e-cigarettes, and chewing tobacco.  Get help right away if you have any symptoms of a stroke. This information is not intended to replace advice given to you by your health care provider. Make sure you discuss any questions you have with your health care provider. Document Revised: 12/12/2018 Document Reviewed: 12/12/2018 Elsevier Patient Education  2021 [employer].

## 2020-06-19 ENCOUNTER — Other Ambulatory Visit (INDEPENDENT_AMBULATORY_CARE_PROVIDER_SITE_OTHER): Payer: Medicare Other

## 2020-06-19 ENCOUNTER — Other Ambulatory Visit: Payer: Self-pay

## 2020-06-19 ENCOUNTER — Ambulatory Visit: Payer: Medicare Other | Admitting: Pharmacist

## 2020-06-19 DIAGNOSIS — E109 Type 1 diabetes mellitus without complications: Secondary | ICD-10-CM

## 2020-06-19 DIAGNOSIS — I10 Essential (primary) hypertension: Secondary | ICD-10-CM

## 2020-06-19 DIAGNOSIS — E7800 Pure hypercholesterolemia, unspecified: Secondary | ICD-10-CM

## 2020-06-19 LAB — LIPID PANEL
Cholesterol: 169 mg/dL (ref 0–200)
HDL: 72.2 mg/dL (ref >39.00)
LDL Cholesterol: 78 mg/dL (ref 0–99)
NonHDL: 96.96
Total CHOL/HDL Ratio: 2
Triglycerides: 95 mg/dL (ref 0.0–149.0)
VLDL: 19 mg/dL (ref 0.0–40.0)

## 2020-06-19 LAB — COMPREHENSIVE METABOLIC PANEL
ALT: 16 U/L (ref 0–35)
AST: 16 U/L (ref 0–37)
Albumin: 3.7 g/dL (ref 3.5–5.2)
Alkaline Phosphatase: 101 U/L (ref 39–117)
BUN: 16 mg/dL (ref 6–23)
CO2: 27 mEq/L (ref 19–32)
Calcium: 9 mg/dL (ref 8.4–10.5)
Chloride: 104 mEq/L (ref 96–112)
Creatinine, Ser: 0.81 mg/dL (ref 0.40–1.20)
GFR: 74.91 mL/min (ref >60.00)
Glucose, Bld: 223 mg/dL — ABNORMAL HIGH (ref 70–99)
Potassium: 4.4 mEq/L (ref 3.5–5.1)
Sodium: 138 mEq/L (ref 135–145)
Total Bilirubin: 0.6 mg/dL (ref 0.2–1.2)
Total Protein: 6.1 g/dL (ref 6.0–8.3)

## 2020-06-19 LAB — HEMOGLOBIN A1C: Hgb A1c MFr Bld: 9.2 % — ABNORMAL HIGH (ref 4.6–6.5)

## 2020-06-19 NOTE — Progress Notes (Signed)
Patient presented to pick up samples of Katherine Costa as she is getting transitioned to from Novolin to [place] for better A1c lowering and overall daytime coverage. Patient gave herself her first injection in the office.  Patient was instructed to inject 60 units daily and will plan to titrate from there. Provided 2 pens for samples.  Attempted to connect to her Dexcom but her current sensor is connected to her phone. Patient was instructed to connect next sensor to her phone so the clinic can connect virtually.

## 2020-06-21 ENCOUNTER — Telehealth: Payer: Self-pay | Admitting: *Deleted

## 2020-06-21 MED ORDER — TRESIBA FLEXTOUCH 100 UNIT/ML ~~LOC~~ SOPN: PEN_INJECTOR | SUBCUTANEOUS | 0 refills | Status: DC

## 2020-06-21 NOTE — Telephone Encounter (Signed)
-----   Message from Viona Gilmore, [facility] sent at 06/20/2020  5:12 PM EDT ----- Regarding: Katherine Costa sample Hi,  Can you please put in a Tresiba sample for [name]? I gave her 2 boxes of Tresiba (2 pens total) with exp: 03/2021 and lot [id number].  Thank you, Maddie

## 2020-06-26 ENCOUNTER — Other Ambulatory Visit: Payer: Self-pay

## 2020-06-26 ENCOUNTER — Encounter: Payer: Self-pay | Admitting: Family Medicine

## 2020-06-26 ENCOUNTER — Ambulatory Visit (INDEPENDENT_AMBULATORY_CARE_PROVIDER_SITE_OTHER): Payer: Medicare Other | Admitting: Family Medicine

## 2020-06-26 ENCOUNTER — Other Ambulatory Visit: Payer: Self-pay | Admitting: Family Medicine

## 2020-06-26 VITALS — BP 132/68 | HR 75 | Temp 98.0°F | Ht 65.0 in | Wt 249.0 lb

## 2020-06-26 DIAGNOSIS — E109 Type 1 diabetes mellitus without complications: Secondary | ICD-10-CM

## 2020-06-26 DIAGNOSIS — I10 Essential (primary) hypertension: Secondary | ICD-10-CM

## 2020-06-26 DIAGNOSIS — E7800 Pure hypercholesterolemia, unspecified: Secondary | ICD-10-CM

## 2020-06-26 DIAGNOSIS — K219 Gastro-esophageal reflux disease without esophagitis: Secondary | ICD-10-CM | POA: Diagnosis not present

## 2020-06-26 MED ORDER — CLOTRIMAZOLE-BETAMETHASONE 1-0.05 % EX CREA: TOPICAL_CREAM | CUTANEOUS | 2 refills | Status: AC

## 2020-06-26 NOTE — Patient Instructions (Addendum)
Return to 70 units tresiba    humalog should be dosed as follows: Check blood sugar prior to meal and then give insulin as stated below. DO not inject unless food is in front of you.  You should use 5 units humalog with each meal and add on units as below  Additional units 70-100: no insulin 100-120: 2 units 120-140: 3 units 140-199: 4 units 200-249: 5  units 250-299: 6  units 300-349: 7 units 350-400: 8 units 400+ 10 units

## 2020-06-26 NOTE — Addendum Note (Signed)
Addended by: Amanda Cockayne on: 06/26/2020 09:46 AM   Modules accepted: Orders

## 2020-06-26 NOTE — Progress Notes (Signed)
Katherine Costa DOB: [birth year] Encounter date: 06/26/2020  This is a 70 y.o. female who presents with Chief Complaint  Patient presents with  . Follow-up    History of present illness: Insulin-dependent diabetes: Patient met with pharmacist last week and was given Tresiba samples.  Instructed to inject 60 units daily.  Pharmacy is also helping to manage her Dexcom.  Additionally, they have helped with medication assistance program for [place] and NovoLog. Patient states that sugar has been sky high since starting tresiba. Started taking humalog 5mg , then started with 5mg  more. Sugars still 300. Yesterday morning sugar was good at 126. But then stayed low after breakfast (still took tresiba); had to keep eating to keep sugar up, but was active, mowing. High during day yesterday was 126. Ate good supper last night and then 300-400 all night. Has been increasing tresiba by 5 units when still high; up to 80mg . Doesn't feel humalog is helping as quickly as she would expect. Took 6 units this morning. She has not been doing humalog with each meal. Currently at 271. Last meal was yesterday at 6pm. Took 9 units humalog last night 9:20 (sugar was in 100's before supper and then after eating meatloaf and 2 veggies she was in the 300's - still seemed to be climbing after fast acting/post meal); doing tresiba in the morning.   Fasting this morning: 279.  Took tresiba this morning (80units) and 6 units humalog - current sugar is 273. Hasn't had anything to eat yet.   Hypertension: Lisinopril-hydrochlorothiazide 10-12 0.5. didn't take it yet today. Hasn't been checking at home.  GERD: Prilosec 40 mg daily Hyperlipidemia: rosuvastatin 20mg  (just switched from zocor), colestipol   Allergies  Allergen Reactions  . Pioglitazone     REACTION: Edema   Current Meds  Medication Sig  . Ascorbic Acid (VITAMIN C) 1000 MG tablet Take 1,000 mg by mouth daily.  Marland Kitchen aspirin 81 MG EC tablet Take 81 mg by mouth daily.  Swallow whole.  . augmented betamethasone dipropionate (DIPROLENE) 0.05 % ointment Apply topically 3 (three) times daily as needed. rash  . clotrimazole-betamethasone (LOTRISONE) cream APPLY TOPICALLY THREE TIMES A DAY AS NEEDED FOR RASH.  Marland Kitchen Continuous Blood Gluc Receiver (DEXCOM G6 RECEIVER) DEVI 1 Device by Does not apply route as needed.  . Continuous Blood Gluc Sensor (DEXCOM G6 SENSOR) MISC 1 Device by Does not apply route as needed.  . Continuous Blood Gluc Transmit (DEXCOM G6 TRANSMITTER) MISC 1 transmitter every 10 days  . insulin degludec (TRESIBA FLEXTOUCH) 100 UNIT/ML FlexTouch Pen Sample  exp: 03/2021 and lot [id number].  Marland Kitchen insulin lispro (HUMALOG) 100 UNIT/ML injection Inject into the skin. Inject 5-7 units as needed  . lisinopril-hydrochlorothiazide (ZESTORETIC) 10-12.5 MG tablet Take 1 tablet by mouth daily.  . Magnesium 400 MG CAPS Take by mouth daily.  . methocarbamol (ROBAXIN) 500 MG tablet Take 1 tablet (500 mg total) by mouth every 8 (eight) hours as needed for muscle spasms.  Marland Kitchen omeprazole (PRILOSEC) 40 MG capsule Take 1 capsule (40 mg total) by mouth daily.  Marland Kitchen oxybutynin (DITROPAN-XL) 5 MG 24 hr tablet TAKE ONE TABLET BY MOUTH AT BEDTIME  . rosuvastatin (CRESTOR) 20 MG tablet Take 1 tablet (20 mg total) by mouth daily.    Review of Systems  Constitutional: Negative for chills, fatigue and fever.  Respiratory: Negative for cough, chest tightness, shortness of breath and wheezing.   Cardiovascular: Negative for chest pain, palpitations and leg swelling.    Objective:  BP 132/68 (  BP Location: Left Arm, Patient Position: Sitting, Cuff Size: Large)   Pulse 75   Temp 98 F (36.7 C) (Oral)   Ht $R'5\' 5"'AX$  (1.651 m)   Wt 249 lb (112.9 kg)   SpO2 96%   BMI 41.44 kg/m   Weight: 249 lb (112.9 kg)   BP Readings from Last 3 Encounters:  06/26/20 132/68  05/09/20 (!) 142/79  03/18/20 132/78   Wt Readings from Last 3 Encounters:  06/26/20 249 lb (112.9 kg)  05/09/20 250 lb  (113.4 kg)  04/11/20 250 lb (113.4 kg)    Physical Exam Constitutional:      General: She is not in acute distress.    Appearance: She is well-developed.  Cardiovascular:     Rate and Rhythm: Normal rate and regular rhythm.     Heart sounds: Normal heart sounds. No murmur heard. No friction rub.  Pulmonary:     Effort: Pulmonary effort is normal. No respiratory distress.     Breath sounds: Normal breath sounds. No wheezing or rales.  Musculoskeletal:     Right lower leg: No edema.     Left lower leg: No edema.  Neurological:     Mental Status: She is alert and oriented to person, place, and time.  Psychiatric:        Behavior: Behavior normal.     Assessment/Plan  1. Essential hypertension Well controlled. Continue current medication.   2. Gastroesophageal reflux disease, unspecified whether esophagitis present Well controlled on omeprazole.  3. Type 1 diabetes mellitus without complication (HCC) We are going to decrease tresiba to 70 units; start with humalog 5 units/meal: with additional sliding scale units. See AVS.  - C-peptide; Future  4. HYPERCHOLESTEROLEMIA Continue with crestor. LDL at goal now.   42 minutes spent with patient, follow up plan, discussion with in house pharmacy. Return in about 3 months (around 09/26/2020).    Micheline Rough, MD

## 2020-06-27 ENCOUNTER — Encounter: Payer: Self-pay | Admitting: Family Medicine

## 2020-06-27 LAB — C-PEPTIDE: C-Peptide: <0.1 ng/mL — ABNORMAL LOW (ref 0.80–3.85)

## 2020-07-10 ENCOUNTER — Telehealth: Payer: Self-pay | Admitting: Family Medicine

## 2020-07-10 NOTE — Telephone Encounter (Signed)
Left message for patient to call back and schedule Medicare Annual Wellness Visit (AWV) either virtually or in office.   AWV-I per [place] 07/27/18 please schedule at anytime with [facility] Nurse Health Advisor 1 or 2   This should be a 45 minute visit.

## 2020-07-23 ENCOUNTER — Telehealth: Payer: Self-pay | Admitting: *Deleted

## 2020-07-23 NOTE — Telephone Encounter (Signed)
Patient assistance samples arrived:  Novofine 32G tip 5 boxes Tresiba  7 boxes Novolog flexpen 3 boxes  Patient is aware

## 2020-08-09 ENCOUNTER — Ambulatory Visit (INDEPENDENT_AMBULATORY_CARE_PROVIDER_SITE_OTHER): Payer: Medicare Other | Admitting: Pharmacist

## 2020-08-09 DIAGNOSIS — I10 Essential (primary) hypertension: Secondary | ICD-10-CM | POA: Diagnosis not present

## 2020-08-09 DIAGNOSIS — E109 Type 1 diabetes mellitus without complications: Secondary | ICD-10-CM | POA: Diagnosis not present

## 2020-08-09 NOTE — Progress Notes (Signed)
Chronic Care Management Pharmacy Note  08/19/2020 Name:  Katherine Costa MRN:  [id number] DOB:  March 14, 1950  Summary: BGs elevated in evening and after meals per Dexcom CGM report   Recommendations/Changes made from today's visit: -Recommended using sliding scale with the addition of 5 units  -Consider switching Novolog to Fiasp to avoid short acting injections to soon before meals -Counseled on importance of injecting Novolog 15-30 minutes before eating   Plan: -Follow up in 3 weeks   Subjective: Katherine Costa is an 70 y.o. year old female who is a primary patient of Koberlein, Steele Berg, MD.  The CCM team was consulted for assistance with disease management and care coordination needs.    Engaged with patient by telephone for follow up visit in response to provider referral for pharmacy case management and/or care coordination services.   Consent to Services:  The patient was given information about Chronic Care Management services, agreed to services, and gave verbal consent prior to initiation of services.  Please see initial visit note for detailed documentation.   Patient Care Team: [name], MD as PCP - General (Family Medicine) Maisie Fus, MD as Attending Physician (Obstetrics and Gynecology) Lorelle Gibbs, MD (Radiology) Viona Gilmore, [facility] as Pharmacist (Pharmacist)  Recent office visits: 06/26/20 Micheline Rough MD (PCP) - Presented for chronic conditions follow up. LDL now at goal of < 100. Patient increased her Tyler Aas to 80 units on her own. Follow up in 3 months.  03/18/20 Micheline Rough MD (PCP) - Presented to clinic for complete physical. Flu vaccine administered. Discontinued cinnamon, cyclobenzaprine, insulin aspart, insulin NPH and omega 3 fish oil. Return for pending blood work results.   Recent consult visits: 05/13/20 Jerel Shepherd MD (General Surgery) - presented for post op exam. Lesions removed from face and scapula. No  medication changes and follow up as needed.    05/09/20 Basil Dess MD (Orthopedic Surgery) - presented to clinic for spondylosis and neck pain. Patient started on gabapentin 193m daily at bedtime, diclofenac potassium 525mthree times daily. Changed methocarbamol 50039mrom every 6 hours to every 8 hours as needed. Placed referral to physical therapy. Follow up in 4 weeks.   04/23/20 MicJerel ShepherdD (surgical specialists): Presented for excision removal of multiple lesions on face.  04/11/20 JamBenjiman Core-C (Orthopedic Surgery) - presented to clinic for neck pain. Ketorolac 37m102mjection administered. Patient started on methocarbamol 500mg59mry 6 hours as needed. MRI of cervical spine ordered. Follow up in 3 weeks to review cervical MRI.   Hospital visits: None in previous 6 months  Objective:  Lab Results  Component Value Date   CREATININE 0.81 06/19/2020   BUN 16 06/19/2020   GFR 74.91 06/19/2020   GFRNONAA >60 09/14/2015   GFRAA >60 09/14/2015   NA 138 06/19/2020   K 4.4 06/19/2020   CALCIUM 9.0 06/19/2020   CO2 27 06/19/2020   GLUCOSE 223 (H) 06/19/2020    Lab Results  Component Value Date/Time   HGBA1C 9.2 (H) 06/19/2020 07:17 AM   HGBA1C 10.6 (H) 03/18/2020 09:49 AM   FRUCTOSAMINE 337 (H) 09/29/2017 10:05 AM   FRUCTOSAMINE 339 (H) 06/29/2017 10:57 AM   GFR 74.91 06/19/2020 07:17 AM   GFR 70.82 03/18/2020 09:49 AM   MICROALBUR <0.7 03/18/2020 09:49 AM   MICROALBUR 0.6 09/28/2019 07:21 AM    Last diabetic Eye exam: No results found for: HMDIABEYEEXA  Last diabetic Foot exam: No results found for: HMDIABFOOTEX   Lab  Results  Component Value Date   CHOL 169 06/19/2020   HDL 72.20 06/19/2020   LDLCALC 78 06/19/2020   LDLDIRECT 110.6 09/02/2011   TRIG 95.0 06/19/2020   CHOLHDL 2 06/19/2020    Hepatic Function Latest Ref Rng & Units 06/19/2020 03/18/2020 09/28/2019  Total Protein 6.0 - 8.3 g/dL 6.1 7.0 6.7  Albumin 3.5 - 5.2 g/dL 3.7 3.8 -  AST 0 - 37 U/L 16  16 33  ALT 0 - 35 U/L _0 Alk Phosphatase 39 - 117 U/L 101 123(H) -  Total Bilirubin 0.2 - 1.2 mg/dL 0.6 0.6 0.5  Bilirubin, Direct 0.0 - 0.3 mg/dL - - -    Lab Results  Component Value Date/Time   TSH 1.90 09/28/2019 07:21 AM   TSH 1.31 06/29/2018 08:01 AM    CBC Latest Ref Rng & Units 04/11/2020 03/18/2020 09/28/2019  WBC 3.8 - 10.8 Thousand/uL 5.8 5.4 7.5  Hemoglobin 11.7 - 15.5 g/dL 14.2 14.5 15.2  Hematocrit 35.0 - 45.0 % 43.7 43.4 46.5(H)  Platelets 140 - 400 Thousand/uL 282 307.0 288    No results found for: VD25OH  Clinical ASCVD: No  The 10-year ASCVD risk score Mikey Bussing DC Jr., et al., 2013) is: 15.5%   Values used to calculate the score:     Age: 89 years     Sex: Female     Is Non-Hispanic African American: No     Diabetic: Yes     Tobacco smoker: No     Systolic Blood Pressure: 741 mmHg     Is BP treated: Yes     HDL Cholesterol: 72.2 mg/dL     Total Cholesterol: 169 mg/dL    Depression screen [facility] 2/9 03/18/2020 02/04/2018  Decreased Interest 0 0  Down, Depressed, Hopeless 0 0  PHQ - 2 Score 0 0      Social History   Tobacco Use  Smoking Status Never  Smokeless Tobacco Never   BP Readings from Last 3 Encounters:  06/26/20 132/68  05/09/20 (!) 142/79  03/18/20 132/78   Pulse Readings from Last 3 Encounters:  06/26/20 75  05/09/20 81  03/18/20 82   Wt Readings from Last 3 Encounters:  06/26/20 249 lb (112.9 kg)  05/09/20 250 lb (113.4 kg)  04/11/20 250 lb (113.4 kg)   BMI Readings from Last 3 Encounters:  06/26/20 41.44 kg/m  05/09/20 41.60 kg/m  04/11/20 41.60 kg/m    Assessment/Interventions: Review of patient past medical history, allergies, medications, health status, including review of consultants reports, laboratory and other test data, was performed as part of comprehensive evaluation and provision of chronic care management services.   SDOH:  (Social Determinants of Health) assessments and interventions performed:  Yes   SDOH Screenings   Alcohol Screen: Not on file  Depression (PHQ2-9): Low Risk    PHQ-2 Score: 0  Financial Resource Strain: Medium Risk   Difficulty of Paying Living Expenses: Somewhat hard  Food Insecurity: Not on file  Housing: Not on file  Physical Activity: Not on file  Social Connections: Not on file  Stress: Not on file  Tobacco Use: Low Risk    Smoking Tobacco Use: Never   Smokeless Tobacco Use: Never  Transportation Needs: No Transportation Needs   Lack of Transportation (Medical): No   Lack of Transportation (Non-Medical): No   CCM Care Plan  Allergies  Allergen Reactions   Pioglitazone     REACTION: Edema    Medications Reviewed Today  Reviewed by Agnes Lawrence, CMA (Certified Medical Assistant) on 06/26/20 at [id number]  Med List Status: <None>   Medication Order Taking? Sig Documenting Provider Last Dose Status Informant  Ascorbic Acid (VITAMIN C) 1000 MG tablet [id number] Yes Take 1,000 mg by mouth daily. [provider] Taking Active   aspirin 81 MG EC tablet [id number] Yes Take 81 mg by mouth daily. Swallow whole. [provider] Taking Active   augmented betamethasone dipropionate (DIPROLENE) 0.05 % ointment [id number] Yes Apply topically 3 (three) times daily as needed. rash Renato Shin, MD Taking Active   clotrimazole-betamethasone Donalynn Furlong) cream [id number] Yes APPLY TOPICALLY THREE TIMES A DAY AS NEEDED FOR RASH. Renato Shin, MD Taking Active Self  Continuous Blood Gluc Receiver ([place]ren) DEVI [id number] Yes 1 Device by Does not apply route as needed. [name], MD Taking Active   Continuous Blood Gluc Sensor (DEXCOM G6 SENSOR) MISC [id number] Yes 1 Device by Does not apply route as needed. [name], MD Taking Active   Continuous Blood Gluc Transmit (DEXCOM G6 TRANSMITTER) MISC [id number] Yes 1 transmitter every 10 days [name], MD Taking Active   insulin degludec (TRESIBA FLEXTOUCH) 100  UNIT/ML FlexTouch Pen [id number] Yes Sample  exp: 03/2021 and lot [id number]. [name], MD Taking Active   insulin lispro (HUMALOG) 100 UNIT/ML injection [id number] Yes Inject into the skin. Inject 5-7 units as needed [provider] Taking Active   lisinopril-hydrochlorothiazide (ZESTORETIC) 10-12.5 MG tablet [id number] Yes Take 1 tablet by mouth daily. [name], MD Taking Active   Magnesium 400 MG CAPS [id number] Yes Take by mouth daily. [provider] Taking Active   methocarbamol (ROBAXIN) 500 MG tablet [id number] Yes Take 1 tablet (500 mg total) by mouth every 8 (eight) hours as needed for muscle spasms. Jessy Oto, MD Taking Active   omeprazole (PRILOSEC) 40 MG capsule [id number] Yes Take 1 capsule (40 mg total) by mouth daily. [name], MD Taking Active   oxybutynin (DITROPAN-XL) 5 MG 24 hr tablet [id number] Yes TAKE ONE TABLET BY MOUTH AT BEDTIME Koberlein, Junell C, MD Taking Active   rosuvastatin (CRESTOR) 20 MG tablet [id number] Yes Take 1 tablet (20 mg total) by mouth daily. [name], MD Taking Active             Patient Active Problem List   Diagnosis Date Noted   Urinary incontinence 06/24/2018   Gastroesophageal reflux disease 02/11/2018   Primary localized osteoarthritis of left knee 09/13/2015   Pain in joint, shoulder region 06/01/2014   Morbid obesity ([place]) 04/21/2013   Encounter for long-term (current) use of other medications 09/02/2011   LIVER DISORDER 03/17/2010   ASYMPTOMATIC POSTMENOPAUSAL STATUS 05/21/2009   HERPES LABIALIS 03/13/2009   HYPERCHOLESTEROLEMIA 02/09/2008   Type 1 diabetes mellitus ([place]) 08/24/2006   Essential hypertension 08/24/2006    Immunization History  Administered Date(s) Administered   Fluad Quad(high Dose 65+) 10/14/2018, 03/18/2020   Influenza Whole 10/26/2008, 12/06/2009, 10/02/2010   Influenza, High Dose Seasonal PF 10/29/2017   Influenza,inj,Quad PF,6+ Mos 10/31/2013    Influenza-Unspecified 11/16/2015, 11/17/2016   PFIZER(Purple Top)SARS-COV-2 Vaccination 02/26/2019, 03/19/2019, 01/04/2020   Pneumococcal Conjugate-13 09/13/2019   Pneumococcal Polysaccharide-23 05/21/2009   Tdap 06/29/2017   Zoster, Live 12/11/2014    Conditions to be addressed/monitored:  Hypertension, Hyperlipidemia, Diabetes, GERD, Overactive Bladder and Pain  Conditions addressed this visit: Diabetes, hypertension  Care Plan : [place]  Updates made by Viona Gilmore, [place] since 08/19/2020  12:00 AM     Problem: Problem: Hypertension, Hyperlipidemia, Diabetes, GERD, Overactive Bladder and Pain      Long-Range Goal: Patient-Specific Goal   Start Date: 05/31/2020  Expected End Date: 05/31/2021  Recent Progress: On track  Priority: High  Note:   Current Barriers:  Unable to independently afford treatment regimen Unable to independently monitor therapeutic efficacy Unable to achieve control of diabetes   Pharmacist Clinical Goal(s):  Patient will verbalize ability to afford treatment regimen achieve adherence to monitoring guidelines and medication adherence to achieve therapeutic efficacy achieve control of diabetes as evidenced by A1c  through collaboration with PharmD and provider.   Interventions: 1:1 collaboration with [name], MD regarding development and update of comprehensive plan of care as evidenced by provider attestation and co-signature Inter-disciplinary care team collaboration (see longitudinal plan of care) Comprehensive medication review performed; medication list updated in electronic medical record  Hypertension (BP goal <130/80) -Uncontrolled -Current treatment: Lisinopril - HCTZ 10-12.5 mg 1 tablet daily -Medications previously tried: none  -Current home readings: does not check -Current dietary habits: does add salt to food -Current exercise habits: yardwork but no formal exercsie -Denies hypotensive/hypertensive  symptoms -Educated on Daily salt intake goal < 2300 mg; Exercise goal of 150 minutes per week; Importance of home blood pressure monitoring; Proper BP monitoring technique; -Counseled to monitor BP at home weekly, document, and provide log at future appointments -Counseled on diet and exercise extensively Recommended to continue current medication Recommended lower sodium salt substitute  Hyperlipidemia: (LDL goal < 100) -Controlled -Current treatment: Rosuvastatin 20 mg 1 tablet daily -Medications previously tried: none  -Current dietary patterns: eating take out fast food which includes fried foods; uses canola oil when cooking -Current exercise habits: yardwork but no formal exercise -Educated on Cholesterol goals;  Benefits of statin for ASCVD risk reduction; Importance of limiting foods high in cholesterol; Exercise goal of 150 minutes per week; -Counseled on diet and exercise extensively Recommended to continue current medication  Diabetes (A1c goal <7%) -Uncontrolled -Current medications: Novolog 100 unit/mL inject 5 units plus sliding scale Tresiba 70 units daily -Medications previously tried: pioglitazone (edema) -Current home glucose readings: downloaded CGM data fasting glucose: overnight high blood sugars post prandial glucose: after meals high blood sugars -Denies hypoglycemic/hyperglycemic symptoms -Current meal patterns:  breakfast: 2 eggs, 2 strips of bacon, small bowl of grits or hash browns & gravy lunch: chicken nuggets or hot dog or cheeseburger or fish sandwich or nothing dinner: biggest meal: meat, vegetable, creamed potatoes or mac n'cheese snacks: did not discuss drinks: diet drinks -Current exercise: yardwork and lawnmowing -Educated on A1c and blood sugar goals; Exercise goal of 150 minutes per week; Continuous glucose monitoring; Carbohydrate counting and/or plate method -Counseled to check feet daily and get yearly eye exams -Counseled on diet  and exercise extensively Recommended following sliding scale with the addition of 5 units  GERD (Goal: minimize symptoms of heartburn) -Controlled -Current treatment  Omeprazole 40 mg 1 capsule daily -Medications previously tried: none  -Counseled on non-pharmacologic management of symptoms such as elevating the head of your bed, avoiding eating 2-3 hours before bed, avoiding triggering foods such as acidic, spicy, or fatty foods, eating smaller meals, and wearing clothes that are loose around the waist  Overactive bladder (Goal: minimize symptoms) -Controlled -Current treatment  Oxybutynin XL 5 mg 1 tablet daily at bedtime as needed (taking in the morning sometimes) -Medications previously tried: none  - Patient only takes as needed  Pain (Goal: minimize symptoms) -  Controlled -Current treatment  Diclofenac 50 mg 1 tablet three times daily - was taking twice a day Gabapentin 100 mg 1 capsule at bedtime as needed  Methocarbamol 500 mg 1 tablet every 8 hours as needed - takes it in AM or sometimes in PM -Medications previously tried: none  -Recommended to continue current medication Patient just started physical therapy this week.   Health Maintenance -Vaccine gaps: shingrix, COVID booster -Current therapy:  Vitamin C 1000 mg 1 tablet daily Aspirin 81 mg 1 tablet daily  Betamethasone 0.05% ointment - poison oak Clotrimazole-betamethasone - yeast, during the summer Magnesium 400 mg 1 capsule daily -Educated on Cost vs benefit of each product must be carefully weighed by individual consumer -Patient is satisfied with current therapy and denies issues -Recommended to continue current medication  Patient Goals/Self-Care Activities Patient will:  - take medications as prescribed check glucose with Dexcom, document, and provide at future appointments check blood pressure weekly, document, and provide at future appointments target a minimum of 150 minutes of moderate intensity  exercise weekly  Follow Up Plan: The care management team will reach out to the patient again over the next 30 days.        Patient Care Plan: CCM Pharmacy Care Plan     Problem Identified: Problem: Hypertension, Hyperlipidemia, Diabetes, GERD, Overactive Bladder and Pain      Long-Range Goal: Patient-Specific Goal   Start Date: 05/31/2020  Expected End Date: 05/31/2021  Recent Progress: On track  Priority: High  Note:   Current Barriers:  Unable to independently afford treatment regimen Unable to independently monitor therapeutic efficacy Unable to achieve control of diabetes   Pharmacist Clinical Goal(s):  Patient will verbalize ability to afford treatment regimen achieve adherence to monitoring guidelines and medication adherence to achieve therapeutic efficacy achieve control of diabetes as evidenced by A1c  through collaboration with PharmD and provider.   Interventions: 1:1 collaboration with [name], MD regarding development and update of comprehensive plan of care as evidenced by provider attestation and co-signature Inter-disciplinary care team collaboration (see longitudinal plan of care) Comprehensive medication review performed; medication list updated in electronic medical record  Hypertension (BP goal <130/80) -Uncontrolled -Current treatment: Lisinopril - HCTZ 10-12.5 mg 1 tablet daily -Medications previously tried: none  -Current home readings: does not check -Current dietary habits: does add salt to food -Current exercise habits: yardwork but no formal exercsie -Denies hypotensive/hypertensive symptoms -Educated on Daily salt intake goal < 2300 mg; Exercise goal of 150 minutes per week; Importance of home blood pressure monitoring; Proper BP monitoring technique; -Counseled to monitor BP at home weekly, document, and provide log at future appointments -Counseled on diet and exercise extensively Recommended to continue current  medication Recommended lower sodium salt substitute  Hyperlipidemia: (LDL goal < 100) -Controlled -Current treatment: Rosuvastatin 20 mg 1 tablet daily -Medications previously tried: none  -Current dietary patterns: eating take out fast food which includes fried foods; uses canola oil when cooking -Current exercise habits: yardwork but no formal exercise -Educated on Cholesterol goals;  Benefits of statin for ASCVD risk reduction; Importance of limiting foods high in cholesterol; Exercise goal of 150 minutes per week; -Counseled on diet and exercise extensively Recommended to continue current medication  Diabetes (A1c goal <7%) -Uncontrolled -Current medications: Novolog 100 unit/mL inject 5 units plus sliding scale Tresiba 70 units daily -Medications previously tried: pioglitazone (edema) -Current home glucose readings: downloaded CGM data fasting glucose: overnight high blood sugars post prandial glucose: after meals high blood  sugars -Denies hypoglycemic/hyperglycemic symptoms -Current meal patterns:  breakfast: 2 eggs, 2 strips of bacon, small bowl of grits or hash browns & gravy lunch: chicken nuggets or hot dog or cheeseburger or fish sandwich or nothing dinner: biggest meal: meat, vegetable, creamed potatoes or mac n'cheese snacks: did not discuss drinks: diet drinks -Current exercise: yardwork and lawnmowing -Educated on A1c and blood sugar goals; Exercise goal of 150 minutes per week; Continuous glucose monitoring; Carbohydrate counting and/or plate method -Counseled to check feet daily and get yearly eye exams -Counseled on diet and exercise extensively Recommended following sliding scale with the addition of 5 units  GERD (Goal: minimize symptoms of heartburn) -Controlled -Current treatment  Omeprazole 40 mg 1 capsule daily -Medications previously tried: none  -Counseled on non-pharmacologic management of symptoms such as elevating the head of your bed,  avoiding eating 2-3 hours before bed, avoiding triggering foods such as acidic, spicy, or fatty foods, eating smaller meals, and wearing clothes that are loose around the waist  Overactive bladder (Goal: minimize symptoms) -Controlled -Current treatment  Oxybutynin XL 5 mg 1 tablet daily at bedtime as needed (taking in the morning sometimes) -Medications previously tried: none  - Patient only takes as needed  Pain (Goal: minimize symptoms) -Controlled -Current treatment  Diclofenac 50 mg 1 tablet three times daily - was taking twice a day Gabapentin 100 mg 1 capsule at bedtime as needed  Methocarbamol 500 mg 1 tablet every 8 hours as needed - takes it in AM or sometimes in PM -Medications previously tried: none  -Recommended to continue current medication Patient just started physical therapy this week.   Health Maintenance -Vaccine gaps: shingrix, COVID booster -Current therapy:  Vitamin C 1000 mg 1 tablet daily Aspirin 81 mg 1 tablet daily  Betamethasone 0.05% ointment - poison oak Clotrimazole-betamethasone - yeast, during the summer Magnesium 400 mg 1 capsule daily -Educated on Cost vs benefit of each product must be carefully weighed by individual consumer -Patient is satisfied with current therapy and denies issues -Recommended to continue current medication  Patient Goals/Self-Care Activities Patient will:  - take medications as prescribed check glucose with Dexcom, document, and provide at future appointments check blood pressure weekly, document, and provide at future appointments target a minimum of 150 minutes of moderate intensity exercise weekly  Follow Up Plan: The care management team will reach out to the patient again over the next 30 days.       Medication Assistance:  Tyler Aas and Novolog obtained through [employer] medication assistance program.  Enrollment ends 01/25/21  Compliance/Adherence/Medication fill history: Care Gaps: Shingrix, COVID  booster, eye exam, colonoscopy   Star-Rating Drugs: Lisinopril-HCTZ 10-12.5 mg - last filled 07/01/20 for 90 ds at [facility] Rosuvastatin 20 mg - last filled 05/30/20 for 90 ds at [facility]  Patient's preferred pharmacy is:  [facility], [place] [address] Phone: [phone] Fax: [phone]  [facility]  ([address], [address] [place] [address] Phone: [phone] Fax: [phone]  Uses pill box? Yes - once a week Pt endorses 100% compliance  Can move rosuvastatin to morning time  We discussed: Current pharmacy is preferred with insurance plan and patient is satisfied with pharmacy services Patient decided to: Continue current medication management strategy  Care Plan and Follow Up Patient Decision:  Patient agrees to Care  Plan and Follow-up.  Plan: The care management team will reach out to the patient again over the next 30 days.  [name], PharmD [facility] Clinical Pharmacist [facility] [place]

## 2020-08-28 ENCOUNTER — Telehealth: Payer: Self-pay | Admitting: Pharmacist

## 2020-08-28 NOTE — Chronic Care Management (AMB) (Signed)
    Chronic Care Management Pharmacy Assistant   Name: Katherine Costa  MRN: [id number] DOB: [birth year]  08/28/20-  Called patient to remind of appointment with [name]) on (08/29/20 at 2:30pm via telephone.)   No answer, left message of appointment date, time and type of appointment (either telephone or in person). Left message to have all medications, supplements, blood pressure and/or blood sugar logs available during appointment and to return call if need to reschedule.   Care Gaps:  AWV - message sent to [name] to schedule Zoster vaccines -  never done Covid-19 vaccine booster 4 - overdue since 04/03/20 Ophthalmology exam - overdue since 07/26/20 Colonoscopy - overdue since 07/31/20 Influenza vaccine - overdue since 08/26/20  Star Rating Drug:  Lisinopril-hydrochlorothiazide 10-12.'5mg'$  - last filled on 07/01/20 90DS at [facility] Rosuvastatin '20mg'$  - last filled on 05/30/20 90DS at [facility]  Any gaps in medications fill history? No.  [place]  Clinical Pharmacist Assistant [phone]

## 2020-08-29 ENCOUNTER — Ambulatory Visit (INDEPENDENT_AMBULATORY_CARE_PROVIDER_SITE_OTHER): Payer: Medicare Other | Admitting: Pharmacist

## 2020-08-29 DIAGNOSIS — E109 Type 1 diabetes mellitus without complications: Secondary | ICD-10-CM | POA: Diagnosis not present

## 2020-08-29 DIAGNOSIS — I10 Essential (primary) hypertension: Secondary | ICD-10-CM

## 2020-08-29 NOTE — Progress Notes (Signed)
Chronic Care Management Pharmacy Note  09/10/2020 Name:  Katherine Costa MRN:  [id number] DOB:  November 17, 1950  Summary: BGs elevated after meals per Dexcom CGM report   Recommendations/Changes made from today's visit: -Recommended using sliding scale with the addition of base of 7 units -Consider switching Novolog to Fiasp to avoid short acting injections to soon before meals -Counseled on importance of injecting Novolog at least 15 minutes before eating   Plan: -Follow up after PCP visit  Subjective: Katherine Costa is an 70 y.o. year old female who is a primary patient of Koberlein, Steele Berg, MD.  The CCM team was consulted for assistance with disease management and care coordination needs.    Engaged with patient by telephone for follow up visit in response to provider referral for pharmacy case management and/or care coordination services.   Consent to Services:  The patient was given information about Chronic Care Management services, agreed to services, and gave verbal consent prior to initiation of services.  Please see initial visit note for detailed documentation.   Patient Care Team: [name], MD as PCP - General (Family Medicine) Maisie Fus, MD as Attending Physician (Obstetrics and Gynecology) Lorelle Gibbs, MD (Radiology) Viona Gilmore, [facility] as Pharmacist (Pharmacist)  Recent office visits: 06/26/20 Micheline Rough MD (PCP) - Presented for chronic conditions follow up. LDL now at goal of < 100. Patient increased her Tyler Aas to 80 units on her own. Follow up in 3 months.  03/18/20 Micheline Rough MD (PCP) - Presented to clinic for complete physical. Flu vaccine administered. Discontinued cinnamon, cyclobenzaprine, insulin aspart, insulin NPH and omega 3 fish oil. Return for pending blood work results.   Recent consult visits: 05/13/20 Jerel Shepherd MD (General Surgery) - presented for post op exam. Lesions removed from face and scapula. No  medication changes and follow up as needed.    05/09/20 Basil Dess MD (Orthopedic Surgery) - presented to clinic for spondylosis and neck pain. Patient started on gabapentin 147m daily at bedtime, diclofenac potassium 544mthree times daily. Changed methocarbamol 50049mrom every 6 hours to every 8 hours as needed. Placed referral to physical therapy. Follow up in 4 weeks.   04/23/20 MicJerel ShepherdD (surgical specialists): Presented for excision removal of multiple lesions on face.  04/11/20 JamBenjiman Core-C (Orthopedic Surgery) - presented to clinic for neck pain. Ketorolac 90m84mjection administered. Patient started on methocarbamol 500mg47mry 6 hours as needed. MRI of cervical spine ordered. Follow up in 3 weeks to review cervical MRI.   Hospital visits: None in previous 6 months  Objective:  Lab Results  Component Value Date   CREATININE 0.81 06/19/2020   BUN 16 06/19/2020   GFR 74.91 06/19/2020   GFRNONAA >60 09/14/2015   GFRAA >60 09/14/2015   NA 138 06/19/2020   K 4.4 06/19/2020   CALCIUM 9.0 06/19/2020   CO2 27 06/19/2020   GLUCOSE 223 (H) 06/19/2020    Lab Results  Component Value Date/Time   HGBA1C 9.2 (H) 06/19/2020 07:17 AM   HGBA1C 10.6 (H) 03/18/2020 09:49 AM   FRUCTOSAMINE 337 (H) 09/29/2017 10:05 AM   FRUCTOSAMINE 339 (H) 06/29/2017 10:57 AM   GFR 74.91 06/19/2020 07:17 AM   GFR 70.82 03/18/2020 09:49 AM   MICROALBUR <0.7 03/18/2020 09:49 AM   MICROALBUR 0.6 09/28/2019 07:21 AM    Last diabetic Eye exam: No results found for: HMDIABEYEEXA  Last diabetic Foot exam: No results found for: HMDIABFOOTEX   Lab Results  Component Value Date   CHOL 169 06/19/2020   HDL 72.20 06/19/2020   LDLCALC 78 06/19/2020   LDLDIRECT 110.6 09/02/2011   TRIG 95.0 06/19/2020   CHOLHDL 2 06/19/2020    Hepatic Function Latest Ref Rng & Units 06/19/2020 03/18/2020 09/28/2019  Total Protein 6.0 - 8.3 g/dL 6.1 7.0 6.7  Albumin 3.5 - 5.2 g/dL 3.7 3.8 -  AST 0 - 37 U/L 16  16 33  ALT 0 - 35 U/L _0 Alk Phosphatase 39 - 117 U/L 101 123(H) -  Total Bilirubin 0.2 - 1.2 mg/dL 0.6 0.6 0.5  Bilirubin, Direct 0.0 - 0.3 mg/dL - - -    Lab Results  Component Value Date/Time   TSH 1.90 09/28/2019 07:21 AM   TSH 1.31 06/29/2018 08:01 AM    CBC Latest Ref Rng & Units 04/11/2020 03/18/2020 09/28/2019  WBC 3.8 - 10.8 Thousand/uL 5.8 5.4 7.5  Hemoglobin 11.7 - 15.5 g/dL 14.2 14.5 15.2  Hematocrit 35.0 - 45.0 % 43.7 43.4 46.5(H)  Platelets 140 - 400 Thousand/uL 282 307.0 288    No results found for: VD25OH  Clinical ASCVD: No  The 10-year ASCVD risk score Mikey Bussing DC Jr., et al., 2013) is: 17.5%   Values used to calculate the score:     Age: 70 years     Sex: Female     Is Non-Hispanic African American: No     Diabetic: Yes     Tobacco smoker: No     Systolic Blood Pressure: 161 mmHg     Is BP treated: Yes     HDL Cholesterol: 72.2 mg/dL     Total Cholesterol: 169 mg/dL    Depression screen [facility] 2/9 03/18/2020 02/04/2018  Decreased Interest 0 0  Down, Depressed, Hopeless 0 0  PHQ - 2 Score 0 0      Social History   Tobacco Use  Smoking Status Never  Smokeless Tobacco Never   BP Readings from Last 3 Encounters:  06/26/20 132/68  05/09/20 (!) 142/79  03/18/20 132/78   Pulse Readings from Last 3 Encounters:  06/26/20 75  05/09/20 81  03/18/20 82   Wt Readings from Last 3 Encounters:  06/26/20 249 lb (112.9 kg)  05/09/20 250 lb (113.4 kg)  04/11/20 250 lb (113.4 kg)   BMI Readings from Last 3 Encounters:  06/26/20 41.44 kg/m  05/09/20 41.60 kg/m  04/11/20 41.60 kg/m    Assessment/Interventions: Review of patient past medical history, allergies, medications, health status, including review of consultants reports, laboratory and other test data, was performed as part of comprehensive evaluation and provision of chronic care management services.   SDOH:  (Social Determinants of Health) assessments and interventions performed:  Yes   SDOH Screenings   Alcohol Screen: Not on file  Depression (PHQ2-9): Low Risk    PHQ-2 Score: 0  Financial Resource Strain: Medium Risk   Difficulty of Paying Living Expenses: Somewhat hard  Food Insecurity: Not on file  Housing: Not on file  Physical Activity: Not on file  Social Connections: Not on file  Stress: Not on file  Tobacco Use: Low Risk    Smoking Tobacco Use: Never   Smokeless Tobacco Use: Never  Transportation Needs: No Transportation Needs   Lack of Transportation (Medical): No   Lack of Transportation (Non-Medical): No   CCM Care Plan  Allergies  Allergen Reactions   Pioglitazone     REACTION: Edema    Medications Reviewed Today     Reviewed by  Agnes Lawrence, CMA (Certified Psychologist, sport and exercise) on 06/26/20 at [phone]  Med List Status: <None>   Medication Order Taking? Sig Documenting Provider Last Dose Status Informant  Ascorbic Acid (VITAMIN C) 1000 MG tablet [id number] Yes Take 1,000 mg by mouth daily. [provider] Taking Active   aspirin 81 MG EC tablet [id number] Yes Take 81 mg by mouth daily. Swallow whole. [provider] Taking Active   augmented betamethasone dipropionate (DIPROLENE) 0.05 % ointment [id number] Yes Apply topically 3 (three) times daily as needed. rash Renato Shin, MD Taking Active   clotrimazole-betamethasone Donalynn Furlong) cream [id number] Yes APPLY TOPICALLY THREE TIMES A DAY AS NEEDED FOR RASH. Renato Shin, MD Taking Active Self  Continuous Blood Gluc Receiver ([place]) DEVI [id number] Yes 1 Device by Does not apply route as needed. [name], MD Taking Active   Continuous Blood Gluc Sensor (DEXCOM G6 SENSOR) MISC [id number] Yes 1 Device by Does not apply route as needed. [name], MD Taking Active   Continuous Blood Gluc Transmit (DEXCOM G6 TRANSMITTER) MISC [id number] Yes 1 transmitter every 10 days [name], MD Taking Active   insulin degludec (TRESIBA FLEXTOUCH) 100  UNIT/ML FlexTouch Pen [id number] Yes Sample  exp: 03/2021 and lot [id number]. [name], MD Taking Active   insulin lispro (HUMALOG) 100 UNIT/ML injection [id number] Yes Inject into the skin. Inject 5-7 units as needed [provider] Taking Active   lisinopril-hydrochlorothiazide (ZESTORETIC) 10-12.5 MG tablet [id number] Yes Take 1 tablet by mouth daily. [name], MD Taking Active   Magnesium 400 MG CAPS [id number] Yes Take by mouth daily. [provider] Taking Active   methocarbamol (ROBAXIN) 500 MG tablet [id number] Yes Take 1 tablet (500 mg total) by mouth every 8 (eight) hours as needed for muscle spasms. Jessy Oto, MD Taking Active   omeprazole (PRILOSEC) 40 MG capsule [id number] Yes Take 1 capsule (40 mg total) by mouth daily. [name], MD Taking Active   oxybutynin (DITROPAN-XL) 5 MG 24 hr tablet [id number] Yes TAKE ONE TABLET BY MOUTH AT BEDTIME Koberlein, Junell C, MD Taking Active   rosuvastatin (CRESTOR) 20 MG tablet [id number] Yes Take 1 tablet (20 mg total) by mouth daily. [name], MD Taking Active             Patient Active Problem List   Diagnosis Date Noted   Urinary incontinence 06/24/2018   Gastroesophageal reflux disease 02/11/2018   Primary localized osteoarthritis of left knee 09/13/2015   Pain in joint, shoulder region 06/01/2014   Morbid obesity ([place]) 04/21/2013   Encounter for long-term (current) use of other medications 09/02/2011   LIVER DISORDER 03/17/2010   ASYMPTOMATIC POSTMENOPAUSAL STATUS 05/21/2009   HERPES LABIALIS 03/13/2009   HYPERCHOLESTEROLEMIA 02/09/2008   Type 1 diabetes mellitus ([place]) 08/24/2006   Essential hypertension 08/24/2006    Immunization History  Administered Date(s) Administered   Fluad Quad(high Dose 65+) 10/14/2018, 03/18/2020   Influenza Whole 10/26/2008, 12/06/2009, 10/02/2010   Influenza, High Dose Seasonal PF 10/29/2017   Influenza,inj,Quad PF,6+ Mos 10/31/2013    Influenza-Unspecified 11/16/2015, 11/17/2016   PFIZER(Purple Top)SARS-COV-2 Vaccination 02/26/2019, 03/19/2019, 01/04/2020   Pneumococcal Conjugate-13 09/13/2019   Pneumococcal Polysaccharide-23 05/21/2009   Tdap 06/29/2017   Zoster, Live 12/11/2014    Conditions to be addressed/monitored:  Hypertension, Hyperlipidemia, Diabetes, GERD, Overactive Bladder and Pain  Conditions addressed this visit: Diabetes, hypertension  Care Plan : [place]  Updates made by Viona Gilmore, [place] since 09/10/2020 12:00 AM  Problem: Problem: Hypertension, Hyperlipidemia, Diabetes, GERD, Overactive Bladder and Pain      Long-Range Goal: Patient-Specific Goal   Start Date: 05/31/2020  Expected End Date: 05/31/2021  Recent Progress: On track  Priority: High  Note:   Current Barriers:  Unable to independently afford treatment regimen Unable to independently monitor therapeutic efficacy Unable to achieve control of diabetes   Pharmacist Clinical Goal(s):  Patient will verbalize ability to afford treatment regimen achieve adherence to monitoring guidelines and medication adherence to achieve therapeutic efficacy achieve control of diabetes as evidenced by A1c  through collaboration with PharmD and provider.   Interventions: 1:1 collaboration with [name], MD regarding development and update of comprehensive plan of care as evidenced by provider attestation and co-signature Inter-disciplinary care team collaboration (see longitudinal plan of care) Comprehensive medication review performed; medication list updated in electronic medical record  Hypertension (BP goal <130/80) -Uncontrolled -Current treatment: Lisinopril - HCTZ 10-12.5 mg 1 tablet daily -Medications previously tried: none  -Current home readings: does not check -Current dietary habits: does add salt to food -Current exercise habits: yardwork but no formal exercsie -Denies hypotensive/hypertensive  symptoms -Educated on Daily salt intake goal < 2300 mg; Exercise goal of 150 minutes per week; Importance of home blood pressure monitoring; Proper BP monitoring technique; -Counseled to monitor BP at home weekly, document, and provide log at future appointments -Counseled on diet and exercise extensively Recommended to continue current medication Recommended lower sodium salt substitute  Hyperlipidemia: (LDL goal < 100) -Controlled -Current treatment: Rosuvastatin 20 mg 1 tablet daily -Medications previously tried: none  -Current dietary patterns: eating take out fast food which includes fried foods; uses canola oil when cooking -Current exercise habits: yardwork but no formal exercise -Educated on Cholesterol goals;  Benefits of statin for ASCVD risk reduction; Importance of limiting foods high in cholesterol; Exercise goal of 150 minutes per week; -Counseled on diet and exercise extensively Recommended to continue current medication  Diabetes (A1c goal <7%) -Uncontrolled -Current medications: Novolog 100 unit/mL inject 5 units plus sliding scale Tresiba 70 units daily -Medications previously tried: pioglitazone (edema) -Current home glucose readings: downloaded CGM data fasting glucose: overnight high blood sugars post prandial glucose: after meals high blood sugars -Denies hypoglycemic/hyperglycemic symptoms -Current meal patterns:  breakfast: 2 eggs, 2 strips of bacon, small bowl of grits or hash browns & gravy lunch: chicken nuggets or hot dog or cheeseburger or fish sandwich or nothing dinner: biggest meal: meat, vegetable, creamed potatoes or mac n'cheese snacks: did not discuss drinks: diet drinks -Current exercise: yardwork and lawnmowing -Educated on A1c and blood sugar goals; Exercise goal of 150 minutes per week; Continuous glucose monitoring; Carbohydrate counting and/or plate method -Counseled to check feet daily and get yearly eye exams -Counseled on diet  and exercise extensively Recommended increasing base Novolog by 2 units to 7 units plus sliding scale  GERD (Goal: minimize symptoms of heartburn) -Controlled -Current treatment  Omeprazole 40 mg 1 capsule daily -Medications previously tried: none  -Counseled on non-pharmacologic management of symptoms such as elevating the head of your bed, avoiding eating 2-3 hours before bed, avoiding triggering foods such as acidic, spicy, or fatty foods, eating smaller meals, and wearing clothes that are loose around the waist  Overactive bladder (Goal: minimize symptoms) -Controlled -Current treatment  Oxybutynin XL 5 mg 1 tablet daily at bedtime as needed (taking in the morning sometimes) -Medications previously tried: none  - Patient only takes as needed  Pain (Goal: minimize symptoms) -Controlled -Current treatment  Diclofenac 50 mg 1 tablet three times daily - was taking twice a day Gabapentin 100 mg 1 capsule at bedtime as needed  Methocarbamol 500 mg 1 tablet every 8 hours as needed - takes it in AM or sometimes in PM -Medications previously tried: none  -Recommended to continue current medication Patient just started physical therapy this week.   Health Maintenance -Vaccine gaps: shingrix, COVID booster -Current therapy:  Vitamin C 1000 mg 1 tablet daily Aspirin 81 mg 1 tablet daily  Betamethasone 0.05% ointment - poison oak Clotrimazole-betamethasone - yeast, during the summer Magnesium 400 mg 1 capsule daily -Educated on Cost vs benefit of each product must be carefully weighed by individual consumer -Patient is satisfied with current therapy and denies issues -Recommended to continue current medication  Patient Goals/Self-Care Activities Patient will:  - take medications as prescribed check glucose with Dexcom, document, and provide at future appointments check blood pressure weekly, document, and provide at future appointments target a minimum of 150 minutes of moderate  intensity exercise weekly  Follow Up Plan: The care management team will reach out to the patient again over the next 30 days.           Medication Assistance:  Tyler Aas and Novolog obtained through [employer] medication assistance program.  Enrollment ends 01/25/21  Compliance/Adherence/Medication fill history: Care Gaps: Shingrix, COVID booster, eye exam, colonoscopy   Star-Rating Drugs: Lisinopril-HCTZ 10-12.5 mg - last filled 07/01/20 for 90 ds at [facility] Rosuvastatin 20 mg - last filled 05/30/20 for 90 ds at [facility]  Patient's preferred pharmacy is:  [facility], [place] [address] Phone: [phone] Fax: [phone]  [facility]  ([place], [address] [place] [address] Phone: [phone] Fax: [phone]  Uses pill box? Yes - once a week Pt endorses 100% compliance  Can move rosuvastatin to morning time  We discussed: Current pharmacy is preferred with insurance plan and patient is satisfied with pharmacy services Patient decided to: Continue current medication management strategy  Care Plan and Follow Up Patient Decision:  Patient agrees to Care Plan and Follow-up.  Plan: The care management team will reach out to the patient again over the next 30 days.  [name], PharmD [facility] Clinical Pharmacist [place] [facility] [place]

## 2020-09-04 ENCOUNTER — Encounter: Payer: Self-pay | Admitting: Gastroenterology

## 2020-09-10 NOTE — Patient Instructions (Signed)
Hi Azora,  It was great speaking with you again! Please add 2 units to your base for the Novolog to the sliding scale. You should now be injecting 7 units plus the additional units from the sliding scale.  Please reach out to me if you have any questions or need anything before our follow up!  Best, Maddie  [name], PharmD, [place] [facility] [place] Visit Information   Goals Addressed   None    Patient Care Plan: CCM Pharmacy Care Plan     Problem Identified: Problem: Hypertension, Hyperlipidemia, Diabetes, GERD, Overactive Bladder and Pain      Long-Range Goal: Patient-Specific Goal   Start Date: 05/31/2020  Expected End Date: 05/31/2021  Recent Progress: On track  Priority: High  Note:   Current Barriers:  Unable to independently afford treatment regimen Unable to independently monitor therapeutic efficacy Unable to achieve control of diabetes   Pharmacist Clinical Goal(s):  Patient will verbalize ability to afford treatment regimen achieve adherence to monitoring guidelines and medication adherence to achieve therapeutic efficacy achieve control of diabetes as evidenced by A1c  through collaboration with PharmD and provider.   Interventions: 1:1 collaboration with [name], MD regarding development and update of comprehensive plan of care as evidenced by provider attestation and co-signature Inter-disciplinary care team collaboration (see longitudinal plan of care) Comprehensive medication review performed; medication list updated in electronic medical record  Hypertension (BP goal <130/80) -Uncontrolled -Current treatment: Lisinopril - HCTZ 10-12.5 mg 1 tablet daily -Medications previously tried: none  -Current home readings: does not check -Current dietary habits: does add salt to food -Current exercise habits: yardwork but no formal exercsie -Denies hypotensive/hypertensive symptoms -Educated on  Daily salt intake goal < 2300 mg; Exercise goal of 150 minutes per week; Importance of home blood pressure monitoring; Proper BP monitoring technique; -Counseled to monitor BP at home weekly, document, and provide log at future appointments -Counseled on diet and exercise extensively Recommended to continue current medication Recommended lower sodium salt substitute  Hyperlipidemia: (LDL goal < 100) -Controlled -Current treatment: Rosuvastatin 20 mg 1 tablet daily -Medications previously tried: none  -Current dietary patterns: eating take out fast food which includes fried foods; uses canola oil when cooking -Current exercise habits: yardwork but no formal exercise -Educated on Cholesterol goals;  Benefits of statin for ASCVD risk reduction; Importance of limiting foods high in cholesterol; Exercise goal of 150 minutes per week; -Counseled on diet and exercise extensively Recommended to continue current medication  Diabetes (A1c goal <7%) -Uncontrolled -Current medications: Novolog 100 unit/mL inject 5 units plus sliding scale Tresiba 70 units daily -Medications previously tried: pioglitazone (edema) -Current home glucose readings: downloaded CGM data fasting glucose: overnight high blood sugars post prandial glucose: after meals high blood sugars -Denies hypoglycemic/hyperglycemic symptoms -Current meal patterns:  breakfast: 2 eggs, 2 strips of bacon, small bowl of grits or hash browns & gravy lunch: chicken nuggets or hot dog or cheeseburger or fish sandwich or nothing dinner: biggest meal: meat, vegetable, creamed potatoes or mac n'cheese snacks: did not discuss drinks: diet drinks -Current exercise: yardwork and lawnmowing -Educated on A1c and blood sugar goals; Exercise goal of 150 minutes per week; Continuous glucose monitoring; Carbohydrate counting and/or plate method -Counseled to check feet daily and get yearly eye exams -Counseled on diet and exercise  extensively Recommended increasing base Novolog by 2 units to 7 units plus sliding scale  GERD (Goal: minimize symptoms of heartburn) -Controlled -Current treatment  Omeprazole 40  mg 1 capsule daily -Medications previously tried: none  -Counseled on non-pharmacologic management of symptoms such as elevating the head of your bed, avoiding eating 2-3 hours before bed, avoiding triggering foods such as acidic, spicy, or fatty foods, eating smaller meals, and wearing clothes that are loose around the waist  Overactive bladder (Goal: minimize symptoms) -Controlled -Current treatment  Oxybutynin XL 5 mg 1 tablet daily at bedtime as needed (taking in the morning sometimes) -Medications previously tried: none  - Patient only takes as needed  Pain (Goal: minimize symptoms) -Controlled -Current treatment  Diclofenac 50 mg 1 tablet three times daily - was taking twice a day Gabapentin 100 mg 1 capsule at bedtime as needed  Methocarbamol 500 mg 1 tablet every 8 hours as needed - takes it in AM or sometimes in PM -Medications previously tried: none  -Recommended to continue current medication Patient just started physical therapy this week.   Health Maintenance -Vaccine gaps: shingrix, COVID booster -Current therapy:  Vitamin C 1000 mg 1 tablet daily Aspirin 81 mg 1 tablet daily  Betamethasone 0.05% ointment - poison oak Clotrimazole-betamethasone - yeast, during the summer Magnesium 400 mg 1 capsule daily -Educated on Cost vs benefit of each product must be carefully weighed by individual consumer -Patient is satisfied with current therapy and denies issues -Recommended to continue current medication  Patient Goals/Self-Care Activities Patient will:  - take medications as prescribed check glucose with Dexcom, document, and provide at future appointments check blood pressure weekly, document, and provide at future appointments target a minimum of 150 minutes of moderate intensity  exercise weekly  Follow Up Plan: The care management team will reach out to the patient again over the next 30 days.        Patient verbalizes understanding of instructions provided today and agrees to view in [place].  The pharmacy team will reach out to the patient again over the next 30 days.   Viona Gilmore, [facility]

## 2020-09-16 ENCOUNTER — Encounter: Payer: Self-pay | Admitting: Family Medicine

## 2020-09-16 ENCOUNTER — Other Ambulatory Visit: Payer: Self-pay

## 2020-09-16 ENCOUNTER — Ambulatory Visit (INDEPENDENT_AMBULATORY_CARE_PROVIDER_SITE_OTHER): Payer: Medicare Other | Admitting: Family Medicine

## 2020-09-16 VITALS — BP 124/80 | HR 71 | Temp 97.7°F | Ht 65.0 in | Wt 249.5 lb

## 2020-09-16 DIAGNOSIS — I10 Essential (primary) hypertension: Secondary | ICD-10-CM

## 2020-09-16 DIAGNOSIS — E109 Type 1 diabetes mellitus without complications: Secondary | ICD-10-CM | POA: Diagnosis not present

## 2020-09-16 DIAGNOSIS — E7800 Pure hypercholesterolemia, unspecified: Secondary | ICD-10-CM

## 2020-09-16 LAB — COMPREHENSIVE METABOLIC PANEL
ALT: 17 U/L (ref 0–35)
AST: 17 U/L (ref 0–37)
Albumin: 3.6 g/dL (ref 3.5–5.2)
Alkaline Phosphatase: 112 U/L (ref 39–117)
BUN: 16 mg/dL (ref 6–23)
CO2: 28 mEq/L (ref 19–32)
Calcium: 9.2 mg/dL (ref 8.4–10.5)
Chloride: 103 mEq/L (ref 96–112)
Creatinine, Ser: 0.81 mg/dL (ref 0.40–1.20)
GFR: 74.78 mL/min (ref >60.00)
Glucose, Bld: 207 mg/dL — ABNORMAL HIGH (ref 70–99)
Potassium: 4.9 mEq/L (ref 3.5–5.1)
Sodium: 137 mEq/L (ref 135–145)
Total Bilirubin: 0.6 mg/dL (ref 0.2–1.2)
Total Protein: 6.3 g/dL (ref 6.0–8.3)

## 2020-09-16 LAB — POCT GLYCOSYLATED HEMOGLOBIN (HGB A1C): Hemoglobin A1C: 8.1 % — AB (ref 4.0–5.6)

## 2020-09-16 LAB — LIPID PANEL
Cholesterol: 167 mg/dL (ref 0–200)
HDL: 76.1 mg/dL (ref >39.00)
LDL Cholesterol: 74 mg/dL (ref 0–99)
NonHDL: 90.72
Total CHOL/HDL Ratio: 2
Triglycerides: 83 mg/dL (ref 0.0–149.0)
VLDL: 16.6 mg/dL (ref 0.0–40.0)

## 2020-09-16 LAB — CBC WITH DIFFERENTIAL/PLATELET
Basophils Absolute: 0.1 10*3/uL (ref 0.0–0.1)
Basophils Relative: 0.9 % (ref 0.0–3.0)
Eosinophils Absolute: 0.2 10*3/uL (ref 0.0–0.7)
Eosinophils Relative: 3.5 % (ref 0.0–5.0)
HCT: 41.6 % (ref 36.0–46.0)
Hemoglobin: 14 g/dL (ref 12.0–15.0)
Lymphocytes Relative: 18.5 % (ref 12.0–46.0)
Lymphs Abs: 1.2 10*3/uL (ref 0.7–4.0)
MCHC: 33.8 g/dL (ref 30.0–36.0)
MCV: 89.7 fl (ref 78.0–100.0)
Monocytes Absolute: 0.6 10*3/uL (ref 0.1–1.0)
Monocytes Relative: 9.5 % (ref 3.0–12.0)
Neutro Abs: 4.2 10*3/uL (ref 1.4–7.7)
Neutrophils Relative %: 67.6 % (ref 43.0–77.0)
Platelets: 259 10*3/uL (ref 150.0–400.0)
RBC: 4.63 Mil/uL (ref 3.87–5.11)
RDW: 13.7 % (ref 11.5–15.5)
WBC: 6.3 10*3/uL (ref 4.0–10.5)

## 2020-09-16 NOTE — Progress Notes (Signed)
Katherine Costa DOB: Feb 12, 1950 Encounter date: 09/16/2020  This is a 70 y.o. female who presents with Chief Complaint  Patient presents with   Follow-up    History of present illness: Has  been working with in house pharmacy - last visit 08/29/20 to achieve better glucose control. Made sure to add base of 7 units to sliding scale. Last visit with me was 06/26/20. Per Dexcom report: Average blood sugar in the last 2 weeks has been 185.  She has a pattern of high evening sugars between 9:40pm and  3:30 AM.  38% of blood sugars are high, 13% very high, 48% are in range, less than 1% low, 0% very low.  She cut out her evening snack both nights which has helped with morning blood sugars.  Last colonoscopy completed 08/01/15; repeat due 07/2020  Hypertension: Lisinopril-hydrochlorothiazide 10-12 0.5. didn't take it yet today. Hasn't been checking at home.  GERD: Prilosec 40 mg daily Hyperlipidemia: rosuvastatin '20mg'$  (just switched from zocor), colestipol  Sugars in morning without snack are down to 100 or a little lower;   Allergies  Allergen Reactions   Pioglitazone     REACTION: Edema   Current Meds  Medication Sig   Ascorbic Acid (VITAMIN C) 1000 MG tablet Take 1,000 mg by mouth daily.   aspirin 81 MG EC tablet Take 81 mg by mouth daily. Swallow whole.   augmented betamethasone dipropionate (DIPROLENE) 0.05 % ointment Apply topically 3 (three) times daily as needed. rash   clotrimazole-betamethasone (LOTRISONE) cream APPLY TOPICALLY THREE TIMES A DAY AS NEEDED FOR RASH.   Continuous Blood Gluc Receiver (DEXCOM G6 RECEIVER) DEVI 1 Device by Does not apply route as needed.   Continuous Blood Gluc Sensor (DEXCOM G6 SENSOR) MISC 1 Device by Does not apply route as needed.   Continuous Blood Gluc Transmit (DEXCOM G6 TRANSMITTER) MISC 1 transmitter every 10 days   insulin degludec (TRESIBA FLEXTOUCH) 100 UNIT/ML FlexTouch Pen Sample  exp: 03/2021 and lot [id number].   insulin lispro (HUMALOG) 100  UNIT/ML injection Inject into the skin. Inject 5-7 units as needed   lisinopril-hydrochlorothiazide (ZESTORETIC) 10-12.5 MG tablet Take 1 tablet by mouth daily.   Magnesium 400 MG CAPS Take by mouth daily.   methocarbamol (ROBAXIN) 500 MG tablet Take 1 tablet (500 mg total) by mouth every 8 (eight) hours as needed for muscle spasms.   omeprazole (PRILOSEC) 40 MG capsule Take 1 capsule (40 mg total) by mouth daily.   oxybutynin (DITROPAN-XL) 5 MG 24 hr tablet TAKE ONE TABLET BY MOUTH AT BEDTIME   rosuvastatin (CRESTOR) 20 MG tablet Take 1 tablet (20 mg total) by mouth daily.    Review of Systems  Constitutional:  Negative for chills, fatigue and fever.  Respiratory:  Negative for cough, chest tightness, shortness of breath and wheezing.   Cardiovascular:  Negative for chest pain, palpitations and leg swelling.   Objective:  BP 124/80 (BP Location: Left Arm, Patient Position: Sitting, Cuff Size: Large)   Pulse 71   Temp 97.7 F (36.5 C) (Oral)   Ht '5\' 5"'$  (1.651 m)   Wt 249 lb 8 oz (113.2 kg)   BMI 41.52 kg/m   Weight: 249 lb 8 oz (113.2 kg)   BP Readings from Last 3 Encounters:  09/16/20 124/80  06/26/20 132/68  05/09/20 (!) 142/79   Wt Readings from Last 3 Encounters:  09/16/20 249 lb 8 oz (113.2 kg)  06/26/20 249 lb (112.9 kg)  05/09/20 250 lb (113.4 kg)  Physical Exam Constitutional:      General: She is not in acute distress.    Appearance: She is well-developed.  HENT:     Head: Normocephalic and atraumatic.  Cardiovascular:     Rate and Rhythm: Normal rate and regular rhythm.     Heart sounds: Normal heart sounds. No murmur heard. Pulmonary:     Effort: Pulmonary effort is normal.     Breath sounds: Normal breath sounds.  Abdominal:     General: Bowel sounds are normal. There is no distension.     Palpations: Abdomen is soft.     Tenderness: There is no abdominal tenderness. There is no guarding.  Skin:    General: Skin is warm and dry.     Comments:  Sensory exam of the foot is normal, tested with the monofilament. Good pulses, no lesions or ulcers, good peripheral pulses.  Psychiatric:        Judgment: Judgment normal.    Assessment/Plan  1. Essential hypertension Blood pressures been well controlled.  Continue lisinopril hydrochlorothiazide.  She is aware of benefit of weight loss for blood pressure, overall health, blood sugar control.  They are working on healthier eating and portion control.  She is just started cutting out bedtime snack, which should also help. - CBC with Differential/Platelet; Future - Comprehensive metabolic panel; Future - Comprehensive metabolic panel - CBC with Differential/Platelet  2. Type 1 diabetes mellitus without complication (HCC) Blood sugars with improved control.  She has recently started using basal insulin in addition to sliding scale.  She had not been covering with insulin for mealtime alcohol if sugars were less than 100.  I have instructed her to still use 5 units for breakfast coverage for sugars are between 80 and 100.  She does always carry something with her in case of low sugars, although this has not been an issue for her. - POC HgB A1c  3. HYPERCHOLESTEROLEMIA Continue with Crestor 20 mg. - Comprehensive metabolic panel; Future - Lipid panel; Future - Lipid panel - Comprehensive metabolic panel    Return in about 6 months (around 03/19/2021) for physical exam.     Micheline Rough, MD

## 2020-09-16 NOTE — Patient Instructions (Signed)
If sugars are between 80 and 100 in the morning and you are about to eat breakfast; ok to still use 5 units to cover for breakfast.

## 2020-09-20 DIAGNOSIS — E109 Type 1 diabetes mellitus without complications: Secondary | ICD-10-CM | POA: Diagnosis not present

## 2020-09-20 DIAGNOSIS — Z794 Long term (current) use of insulin: Secondary | ICD-10-CM | POA: Diagnosis not present

## 2020-10-06 ENCOUNTER — Other Ambulatory Visit: Payer: Self-pay | Admitting: Family Medicine

## 2020-10-24 ENCOUNTER — Ambulatory Visit: Payer: Medicare Other

## 2020-11-01 ENCOUNTER — Telehealth: Payer: Self-pay | Admitting: Pharmacist

## 2020-11-01 NOTE — Chronic Care Management (AMB) (Signed)
Chronic Care Management Pharmacy Assistant   Name: Katherine Costa  MRN: [id number] DOB: 07-Mar-1950   Reason for Encounter: Disease State/ General Assessment Call.    Conditions to be addressed/monitored: HTN and DMII   Recent office visits:  09/16/20 Micheline Rough MD (PCP) - seen for essential hypertension and other issues. No medication changes. Follow up in 6 months.   Recent consult visits:  None.  Hospital visits:  None in previous 6 months  Medications: Outpatient Encounter Medications as of 11/01/2020  Medication Sig   Ascorbic Acid (VITAMIN C) 1000 MG tablet Take 1,000 mg by mouth daily.   aspirin 81 MG EC tablet Take 81 mg by mouth daily. Swallow whole.   augmented betamethasone dipropionate (DIPROLENE) 0.05 % ointment Apply topically 3 (three) times daily as needed. rash   clotrimazole-betamethasone (LOTRISONE) cream APPLY TOPICALLY THREE TIMES A DAY AS NEEDED FOR RASH.   Continuous Blood Gluc Receiver (DEXCOM G6 RECEIVER) DEVI 1 Device by Does not apply route as needed.   Continuous Blood Gluc Sensor (DEXCOM G6 SENSOR) MISC 1 Device by Does not apply route as needed.   Continuous Blood Gluc Transmit (DEXCOM G6 TRANSMITTER) MISC 1 transmitter every 10 days   insulin degludec (TRESIBA FLEXTOUCH) 100 UNIT/ML FlexTouch Pen Sample  exp: 03/2021 and lot [id number].   insulin lispro (HUMALOG) 100 UNIT/ML injection Inject into the skin. Inject 5-7 units as needed   lisinopril-hydrochlorothiazide (ZESTORETIC) 10-12.5 MG tablet TAKE 1 TABLET BY MOUTH  DAILY   Magnesium 400 MG CAPS Take by mouth daily.   methocarbamol (ROBAXIN) 500 MG tablet Take 1 tablet (500 mg total) by mouth every 8 (eight) hours as needed for muscle spasms.   omeprazole (PRILOSEC) 40 MG capsule Take 1 capsule (40 mg total) by mouth daily.   oxybutynin (DITROPAN-XL) 5 MG 24 hr tablet TAKE ONE TABLET BY MOUTH AT BEDTIME   rosuvastatin (CRESTOR) 20 MG tablet Take 1 tablet (20 mg total) by mouth daily.   No  facility-administered encounter medications on file as of 11/01/2020.   Fill History: LISINOPRIL/HYDROCHLOROTHIAZIDE  10-12.5 MG TABS 09/24/2020 90   METHOCARBAMOL 500 MG TAB 05/09/2020 13   OMEPRAZOLE  40 MG CPDR 07/01/2020 90   ROSUVASTATIN CALCIUM  20 MG TABS 08/23/2020 90   Recent Relevant Labs: Lab Results  Component Value Date/Time   HGBA1C 8.1 (A) 09/16/2020 08:45 AM   HGBA1C 9.2 (H) 06/19/2020 07:17 AM   HGBA1C 10.6 (H) 03/18/2020 09:49 AM   MICROALBUR <0.7 03/18/2020 09:49 AM   MICROALBUR 0.6 09/28/2019 07:21 AM    Kidney Function Lab Results  Component Value Date/Time   CREATININE 0.81 09/16/2020 09:45 AM   CREATININE 0.81 06/19/2020 07:17 AM   CREATININE 0.79 09/28/2019 07:21 AM   CREATININE 0.77 04/21/2013 05:23 PM   GFR 74.78 09/16/2020 09:45 AM   GFRNONAA >60 09/14/2015 04:29 AM   GFRAA >60 09/14/2015 04:29 AM    Current antihyperglycemic regimen:  Humalog 100 unit/mL inject 5 - 7 units as needed.  Tresiba 70 units daily What recent interventions/DTPs have been made to improve glycemic control:  Novolog changed to Humalog. Have there been any recent hospitalizations or ED visits since last visit with CPP? No Patient hypoglycemic symptoms, including  Patient hyperglycemic symptoms, including  How often are you checking your blood sugar?  What are your blood sugars ranging?  Fasting:  Before meals:  After meals:  Bedtime:  During the week, how often does your blood glucose drop below 70?  Are you  checking your feet daily/regularly?   Adherence Review: Is the patient currently on a STATIN medication? Yes Is the patient currently on ACE/ARB medication? Yes Does the patient have >5 day gap between last estimated fill dates? No  Reviewed chart prior to disease state call. Spoke with patient regarding BP  Recent Office Vitals: BP Readings from Last 3 Encounters:  09/16/20 124/80  06/26/20 132/68  05/09/20 (!) 142/79   Pulse Readings from Last 3  Encounters:  09/16/20 71  06/26/20 75  05/09/20 81    Wt Readings from Last 3 Encounters:  09/16/20 249 lb 8 oz (113.2 kg)  06/26/20 249 lb (112.9 kg)  05/09/20 250 lb (113.4 kg)     Kidney Function Lab Results  Component Value Date/Time   CREATININE 0.81 09/16/2020 09:45 AM   CREATININE 0.81 06/19/2020 07:17 AM   CREATININE 0.79 09/28/2019 07:21 AM   CREATININE 0.77 04/21/2013 05:23 PM   GFR 74.78 09/16/2020 09:45 AM   GFRNONAA >60 09/14/2015 04:29 AM   GFRAA >60 09/14/2015 04:29 AM    BMP Latest Ref Rng & Units 09/16/2020 06/19/2020 03/18/2020  Glucose 70 - 99 mg/dL 207(H) 223(H) 312(H)  BUN 6 - 23 mg/dL 16 16 17   Creatinine 0.40 - 1.20 mg/dL 0.81 0.81 0.85  BUN/Creat Ratio 6 - 22 (calc) - - -  Sodium 135 - 145 mEq/L 137 138 132(L)  Potassium 3.5 - 5.1 mEq/L 4.9 4.4 4.9  Chloride 96 - 112 mEq/L 103 104 97  CO2 19 - 32 mEq/L 28 27 28   Calcium 8.4 - 10.5 mg/dL 9.2 9.0 9.8    Current antihypertensive regimen:  Lisinopril - HCTZ 10-12.5 mg 1 tablet daily How often are you checking your Blood Pressure?  Current home BP readings:  What recent interventions/DTPs have been made by any provider to improve Blood Pressure control since last CPP Visit: None. Any recent hospitalizations or ED visits since last visit with CPP? No What diet changes have been made to improve Blood Pressure Control?   What exercise is being done to improve your Blood Pressure Control?    Adherence Review: Is the patient currently on ACE/ARB medication? Yes Does the patient have >5 day gap between last estimated fill dates? No  Unsuccessful at reaching patient by phone for assessment call.  Care Gaps:  AWV - canceled 10/24/20 Covid-19 vaccine booster 4 - overdue since 03/28/20 Colonoscopy - overdue since 07/31/20 Flu vaccine - due  Star Rating Drugs:  Lisinopril-hydrochlorothiazide 10-12.5mg  - last filled on 09/24/20 90DS at [facility] Rosuvastatin 20mg  - last filled on 08/23/20 90DS at  [place] [phone]

## 2020-11-04 NOTE — Telephone Encounter (Cosign Needed)
2nd attempt

## 2020-11-05 NOTE — Telephone Encounter (Cosign Needed)
3rd attempt

## 2020-11-28 ENCOUNTER — Telehealth: Payer: Self-pay | Admitting: Pharmacist

## 2020-11-28 NOTE — Chronic Care Management (AMB) (Signed)
Chronic Care Management Pharmacy Assistant   Name: Katherine Costa  MRN: [id number] DOB: [birth year]   Reason for Encounter: Disease State/ General Assessment Call.    Conditions to be addressed/monitored: HTN and DMII   Recent office visits:  None.  Recent consult visits:  None.  Hospital visits:  None in previous 6 months  Medications: Outpatient Encounter Medications as of 11/28/2020  Medication Sig   Ascorbic Acid (VITAMIN C) 1000 MG tablet Take 1,000 mg by mouth daily.   aspirin 81 MG EC tablet Take 81 mg by mouth daily. Swallow whole.   augmented betamethasone dipropionate (DIPROLENE) 0.05 % ointment Apply topically 3 (three) times daily as needed. rash   clotrimazole-betamethasone (LOTRISONE) cream APPLY TOPICALLY THREE TIMES A DAY AS NEEDED FOR RASH.   Continuous Blood Gluc Receiver (DEXCOM G6 RECEIVER) DEVI 1 Device by Does not apply route as needed.   Continuous Blood Gluc Sensor (DEXCOM G6 SENSOR) MISC 1 Device by Does not apply route as needed.   Continuous Blood Gluc Transmit (DEXCOM G6 TRANSMITTER) MISC 1 transmitter every 10 days   insulin degludec (TRESIBA FLEXTOUCH) 100 UNIT/ML FlexTouch Pen Sample  exp: 03/2021 and lot [id number].   insulin lispro (HUMALOG) 100 UNIT/ML injection Inject into the skin. Inject 5-7 units as needed   lisinopril-hydrochlorothiazide (ZESTORETIC) 10-12.5 MG tablet TAKE 1 TABLET BY MOUTH  DAILY   Magnesium 400 MG CAPS Take by mouth daily.   methocarbamol (ROBAXIN) 500 MG tablet Take 1 tablet (500 mg total) by mouth every 8 (eight) hours as needed for muscle spasms.   omeprazole (PRILOSEC) 40 MG capsule Take 1 capsule (40 mg total) by mouth daily.   oxybutynin (DITROPAN-XL) 5 MG 24 hr tablet TAKE ONE TABLET BY MOUTH AT BEDTIME   rosuvastatin (CRESTOR) 20 MG tablet Take 1 tablet (20 mg total) by mouth daily.   No facility-administered encounter medications on file as of 11/28/2020.   Fill History: LISINOPRIL/HYDROCHLOROTHIAZIDE   10-12.5 MG TABS 11/26/2020 90   OMEPRAZOLE  40 MG CPDR 07/01/2020 90   ROSUVASTATIN CALCIUM  20 MG TABS 11/13/2020 90   Recent Relevant Labs: Lab Results  Component Value Date/Time   HGBA1C 8.1 (A) 09/16/2020 08:45 AM   HGBA1C 9.2 (H) 06/19/2020 07:17 AM   HGBA1C 10.6 (H) 03/18/2020 09:49 AM   MICROALBUR <0.7 03/18/2020 09:49 AM   MICROALBUR 0.6 09/28/2019 07:21 AM    Kidney Function Lab Results  Component Value Date/Time   CREATININE 0.81 09/16/2020 09:45 AM   CREATININE 0.81 06/19/2020 07:17 AM   CREATININE 0.79 09/28/2019 07:21 AM   CREATININE 0.77 04/21/2013 05:23 PM   GFR 74.78 09/16/2020 09:45 AM   GFRNONAA >60 09/14/2015 04:29 AM   GFRAA >60 09/14/2015 04:29 AM    Current antihyperglycemic regimen:  Humalog 100 unit/mL inject 5 - 7 units as needed.  Tresiba 70 units daily What recent interventions/DTPs have been made to improve glycemic control:  None. Have there been any recent hospitalizations or ED visits since last visit with CPP? No Patient  hypoglycemic symptoms, including  Patient hyperglycemic symptoms, including  How often are you checking your blood sugar?  What are your blood sugars ranging?  Fasting:  Before meals:  After meals:  Bedtime:  During the week, how often does your blood glucose drop below 70?  Are you checking your feet daily/regularly?   Adherence Review: Is the patient currently on a STATIN medication? Yes Is the patient currently on ACE/ARB medication? Yes Does the patient have >5 day  gap between last estimated fill dates? No   Reviewed chart prior to disease state call. Spoke with patient regarding BP  Recent Office Vitals: BP Readings from Last 3 Encounters:  09/16/20 124/80  06/26/20 132/68  05/09/20 (!) 142/79   Pulse Readings from Last 3 Encounters:  09/16/20 71  06/26/20 75  05/09/20 81    Wt Readings from Last 3 Encounters:  09/16/20 249 lb 8 oz (113.2 kg)  06/26/20 249 lb (112.9 kg)  05/09/20 250 lb (113.4 kg)      Kidney Function Lab Results  Component Value Date/Time   CREATININE 0.81 09/16/2020 09:45 AM   CREATININE 0.81 06/19/2020 07:17 AM   CREATININE 0.79 09/28/2019 07:21 AM   CREATININE 0.77 04/21/2013 05:23 PM   GFR 74.78 09/16/2020 09:45 AM   GFRNONAA >60 09/14/2015 04:29 AM   GFRAA >60 09/14/2015 04:29 AM    BMP Latest Ref Rng & Units 09/16/2020 06/19/2020 03/18/2020  Glucose 70 - 99 mg/dL 207(H) 223(H) 312(H)  BUN 6 - 23 mg/dL 16 16 17   Creatinine 0.40 - 1.20 mg/dL 0.81 0.81 0.85  BUN/Creat Ratio 6 - 22 (calc) - - -  Sodium 135 - 145 mEq/L 137 138 132(L)  Potassium 3.5 - 5.1 mEq/L 4.9 4.4 4.9  Chloride 96 - 112 mEq/L 103 104 97  CO2 19 - 32 mEq/L 28 27 28   Calcium 8.4 - 10.5 mg/dL 9.2 9.0 9.8    Current antihypertensive regimen:  Lisinopril - HCTZ 10-12.5 mg 1 tablet daily How often are you checking your Blood Pressure?  Current home BP readings:  What recent interventions/DTPs have been made by any provider to improve Blood Pressure control since last CPP Visit: None. Any recent hospitalizations or ED visits since last visit with CPP? No  Adherence Review: Is the patient currently on ACE/ARB medication? Yes Does the patient have >5 day gap between last estimated fill dates? No  Notes: Unable to reach patient to complete assessment call.  Care Gaps:  AWV - canceled 10/24/20 Covid-19 vaccine booster 4 - overdue since 03/28/20 Colonoscopy - overdue since 07/31/20 Flu vaccine - due Last PCP BP: 124/80 P: 71 on 09/16/20 Last A1C: Lab Results  Component Value Date   HGBA1C 8.1 (A) 09/16/2020    Star Rating Drugs:  Lisinopril-hydrochlorothiazide 10-12.5mg  - last filled on 11/26/20 90DS at [facility] Rosuvastatin 20mg  - last filled on 11/13/20 90DS at [place] [phone]

## 2020-12-02 NOTE — Telephone Encounter (Signed)
2nd attempt

## 2020-12-04 NOTE — Telephone Encounter (Signed)
3rd attempt

## 2020-12-06 ENCOUNTER — Telehealth: Payer: Self-pay | Admitting: Pharmacist

## 2020-12-06 NOTE — Chronic Care Management (AMB) (Signed)
    Chronic Care Management Pharmacy Assistant   Name: Katherine Costa  MRN: [id number] DOB: 04/19/1950  12/09/2020 APPOINTMENT REMINDER  Katherine Costa was reminded to have all medications, supplements and any blood glucose and blood pressure readings available for review with [name], Pharm. D, at her office visit on 12/09/2020 at 2:15.   Questions: Have you had any recent office visit or specialist visit outside of [place]? No  Are there any concerns you would like to discuss during your office visit? No  Are you having any problems obtaining your medications? (Whether it pharmacy issues or cost) No  If patient has any PAP medications ask if they are having any problems getting their PAP medication or refill? Needing to renew.  Care Gaps: AWV - canceled 10/24/20 Covid-19 vaccine - overdue Colonoscopy - overdue  Flu vaccine - due Foot exam - overdue Pneumovax - overdue Last PCP BP: 124/80 P: 71 on 09/16/20 Last A1C: 8.1 on 09/16/2020  Star Rating Drug: Lisinopril-hydrochlorothiazide 10-12.5mg  - last filled on 11/26/2020 90DS at [facility] Rosuvastatin 20mg  - last filled on 11/13/2020 90DS at [facility]  Any gaps in medications fill history? No  Spencer  Catering manager [phone]

## 2020-12-09 ENCOUNTER — Ambulatory Visit (INDEPENDENT_AMBULATORY_CARE_PROVIDER_SITE_OTHER): Payer: Medicare Other | Admitting: Pharmacist

## 2020-12-09 DIAGNOSIS — I10 Essential (primary) hypertension: Secondary | ICD-10-CM

## 2020-12-09 DIAGNOSIS — E109 Type 1 diabetes mellitus without complications: Secondary | ICD-10-CM

## 2020-12-09 NOTE — Progress Notes (Signed)
Chronic Care Management Pharmacy Note  12/25/2020 Name:  BENTLIE WITHEM MRN:  [id number] DOB:  17-Nov-1950  Summary: BGs elevated after meals per Dexcom CGM report   Recommendations/Changes made from today's visit: -Recommended using sliding scale with the addition of base of 9 units -Plan to switch Novolog to Fiasp to avoid short acting injections to soon before meals  Plan: -Follow up in 1 month   Subjective: Katherine Costa is an 70 y.o. year old female who is a primary patient of Koberlein, Steele Berg, MD.  The CCM team was consulted for assistance with disease management and care coordination needs.    Engaged with patient by telephone for follow up visit in response to provider referral for pharmacy case management and/or care coordination services.   Consent to Services:  The patient was given information about Chronic Care Management services, agreed to services, and gave verbal consent prior to initiation of services.  Please see initial visit note for detailed documentation.   Patient Care Team: [name], MD as PCP - General (Family Medicine) Maisie Fus, MD as Attending Physician (Obstetrics and Gynecology) Lorelle Gibbs, MD (Radiology) Viona Gilmore, [facility] as Pharmacist (Pharmacist)  Recent office visits: 09/16/20 Micheline Rough MD-  Presented for chronic conditions follow up. A1c decreased to 8.1%. Recommended base of 5 units for meal times even if blood sugars are between 80-100.  06/26/20 Micheline Rough MD-  Presented for chronic conditions follow up. LDL now at goal of < 100. Patient increased her Tyler Aas to 80 units on her own. Follow up in 3 months.  Recent consult visits: 05/13/20 Jerel Shepherd MD (General Surgery) - presented for post op exam. Lesions removed from face and scapula. No medication changes and follow up as needed.    05/09/20 Basil Dess MD (Orthopedic Surgery) - presented to clinic for spondylosis and neck pain.  Patient started on gabapentin 128m daily at bedtime, diclofenac potassium 521mthree times daily. Changed methocarbamol 50019mrom every 6 hours to every 8 hours as needed. Placed referral to physical therapy. Follow up in 4 weeks.   04/23/20 MicJerel ShepherdD (surgical specialists): Presented for excision removal of multiple lesions on face.  Hospital visits: None in previous 6 months  Objective:  Lab Results  Component Value Date   CREATININE 0.81 09/16/2020   BUN 16 09/16/2020   GFR 74.78 09/16/2020   GFRNONAA >60 09/14/2015   GFRAA >60 09/14/2015   NA 137 09/16/2020   K 4.9 09/16/2020   CALCIUM 9.2 09/16/2020   CO2 28 09/16/2020   GLUCOSE 207 (H) 09/16/2020    Lab Results  Component Value Date/Time   HGBA1C 8.1 (A) 09/16/2020 08:45 AM   HGBA1C 9.2 (H) 06/19/2020 07:17 AM   HGBA1C 10.6 (H) 03/18/2020 09:49 AM   FRUCTOSAMINE 337 (H) 09/29/2017 10:05 AM   FRUCTOSAMINE 339 (H) 06/29/2017 10:57 AM   GFR 74.78 09/16/2020 09:45 AM   GFR 74.91 06/19/2020 07:17 AM   MICROALBUR <0.7 03/18/2020 09:49 AM   MICROALBUR 0.6 09/28/2019 07:21 AM    Last diabetic Eye exam: No results found for: HMDIABEYEEXA  Last diabetic Foot exam: No results found for: HMDIABFOOTEX   Lab Results  Component Value Date   CHOL 167 09/16/2020   HDL 76.10 09/16/2020   LDLCALC 74 09/16/2020   LDLDIRECT 110.6 09/02/2011   TRIG 83.0 09/16/2020   CHOLHDL 2 09/16/2020    Hepatic Function Latest Ref Rng & Units 09/16/2020 06/19/2020 03/18/2020  Total Protein 6.0 -  8.3 g/dL 6.3 6.1 7.0  Albumin 3.5 - 5.2 g/dL 3.6 3.7 3.8  AST 0 - 37 U/L _0 ALT 0 - 35 U/L _1 Alk Phosphatase 39 - 117 U/L 112 101 123(H)  Total Bilirubin 0.2 - 1.2 mg/dL 0.6 0.6 0.6  Bilirubin, Direct 0.0 - 0.3 mg/dL - - -    Lab Results  Component Value Date/Time   TSH 1.90 09/28/2019 07:21 AM   TSH 1.31 06/29/2018 08:01 AM    CBC Latest Ref Rng & Units 09/16/2020 04/11/2020 03/18/2020  WBC 4.0 - 10.5 K/uL 6.3 5.8 5.4   Hemoglobin 12.0 - 15.0 g/dL 14.0 14.2 14.5  Hematocrit 36.0 - 46.0 % 41.6 43.7 43.4  Platelets 150.0 - 400.0 K/uL 259.0 282 307.0    No results found for: VD25OH  Clinical ASCVD: No  The 10-year ASCVD risk score (Arnett DK, et al., 2019) is: 15.3%   Values used to calculate the score:     Age: 8 years     Sex: Female     Is Non-Hispanic African American: No     Diabetic: Yes     Tobacco smoker: No     Systolic Blood Pressure: 338 mmHg     Is BP treated: Yes     HDL Cholesterol: 76.1 mg/dL     Total Cholesterol: 167 mg/dL    Depression screen [facility] 2/9 03/18/2020 02/04/2018  Decreased Interest 0 0  Down, Depressed, Hopeless 0 0  PHQ - 2 Score 0 0      Social History   Tobacco Use  Smoking Status Never  Smokeless Tobacco Never   BP Readings from Last 3 Encounters:  09/16/20 124/80  06/26/20 132/68  05/09/20 (!) 142/79   Pulse Readings from Last 3 Encounters:  09/16/20 71  06/26/20 75  05/09/20 81   Wt Readings from Last 3 Encounters:  09/16/20 249 lb 8 oz (113.2 kg)  06/26/20 249 lb (112.9 kg)  05/09/20 250 lb (113.4 kg)   BMI Readings from Last 3 Encounters:  09/16/20 41.52 kg/m  06/26/20 41.44 kg/m  05/09/20 41.60 kg/m    Assessment/Interventions: Review of patient past medical history, allergies, medications, health status, including review of consultants reports, laboratory and other test data, was performed as part of comprehensive evaluation and provision of chronic care management services.   SDOH:  (Social Determinants of Health) assessments and interventions performed: Yes   SDOH Screenings   Alcohol Screen: Not on file  Depression (PHQ2-9): Low Risk    PHQ-2 Score: 0  Financial Resource Strain: Medium Risk   Difficulty of Paying Living Expenses: Somewhat hard  Food Insecurity: Not on file  Housing: Not on file  Physical Activity: Not on file  Social Connections: Not on file  Stress: Not on file  Tobacco Use: Low Risk    Smoking Tobacco  Use: Never   Smokeless Tobacco Use: Never   Passive Exposure: Not on file  Transportation Needs: No Transportation Needs   Lack of Transportation (Medical): No   Lack of Transportation (Non-Medical): No   CCM Care Plan  Allergies  Allergen Reactions   Pioglitazone     REACTION: Edema    Medications Reviewed Today     Reviewed by Agnes Lawrence, CMA (Certified Medical Assistant) on 09/16/20 at [phone]  Med List Status: <None>   Medication Order Taking? Sig Documenting Provider Last Dose Status Informant  Ascorbic Acid (VITAMIN C) 1000 MG tablet [id number] Yes Take 1,000 mg by mouth  daily. [provider] Taking Active   aspirin 81 MG EC tablet [id number] Yes Take 81 mg by mouth daily. Swallow whole. [provider] Taking Active   augmented betamethasone dipropionate (DIPROLENE) 0.05 % ointment [id number] Yes Apply topically 3 (three) times daily as needed. rash Renato Shin, MD Taking Active   clotrimazole-betamethasone Donalynn Furlong) cream [id number] Yes APPLY TOPICALLY THREE TIMES A DAY AS NEEDED FOR RASH. [name], MD Taking Active   Continuous Blood Gluc Receiver ([place]) DEVI [id number] Yes 1 Device by Does not apply route as needed. [name], MD Taking Active   Continuous Blood Gluc Sensor (DEXCOM G6 SENSOR) MISC [id number] Yes 1 Device by Does not apply route as needed. [name], MD Taking Active   Continuous Blood Gluc Transmit (DEXCOM G6 TRANSMITTER) MISC [id number] Yes 1 transmitter every 10 days [name], MD Taking Active   insulin degludec (TRESIBA FLEXTOUCH) 100 UNIT/ML FlexTouch Pen [id number] Yes Sample  exp: 03/2021 and lot [id number]. [name], MD Taking Active   insulin lispro (HUMALOG) 100 UNIT/ML injection [id number] Yes Inject into the skin. Inject 5-7 units as needed [provider] Taking Active   lisinopril-hydrochlorothiazide (ZESTORETIC) 10-12.5 MG tablet [id number] Yes Take 1 tablet  by mouth daily. [name], MD Taking Active   Magnesium 400 MG CAPS [id number] Yes Take by mouth daily. [provider] Taking Active   methocarbamol (ROBAXIN) 500 MG tablet [id number] Yes Take 1 tablet (500 mg total) by mouth every 8 (eight) hours as needed for muscle spasms. Jessy Oto, MD Taking Active   omeprazole (PRILOSEC) 40 MG capsule [id number] Yes Take 1 capsule (40 mg total) by mouth daily. [name], MD Taking Active   oxybutynin (DITROPAN-XL) 5 MG 24 hr tablet [id number] Yes TAKE ONE TABLET BY MOUTH AT BEDTIME Koberlein, Junell C, MD Taking Active   rosuvastatin (CRESTOR) 20 MG tablet [id number] Yes Take 1 tablet (20 mg total) by mouth daily. [name], MD Taking Active             Patient Active Problem List   Diagnosis Date Noted   Urinary incontinence 06/24/2018   Gastroesophageal reflux disease 02/11/2018   Primary localized osteoarthritis of left knee 09/13/2015   Pain in joint, shoulder region 06/01/2014   Morbid obesity ([place]) 04/21/2013   Encounter for long-term (current) use of other medications 09/02/2011   LIVER DISORDER 03/17/2010   ASYMPTOMATIC POSTMENOPAUSAL STATUS 05/21/2009   HERPES LABIALIS 03/13/2009   HYPERCHOLESTEROLEMIA 02/09/2008   Type 1 diabetes mellitus ([place]) 08/24/2006   Essential hypertension 08/24/2006    Immunization History  Administered Date(s) Administered   Fluad Quad(high Dose 65+) 10/14/2018, 03/18/2020   Influenza Whole 10/26/2008, 12/06/2009, 10/02/2010   Influenza, High Dose Seasonal PF 10/29/2017   Influenza,inj,Quad PF,6+ Mos 10/31/2013   Influenza-Unspecified 11/16/2015, 11/17/2016   PFIZER(Purple Top)SARS-COV-2 Vaccination 02/26/2019, 03/19/2019, 01/04/2020   Pneumococcal Conjugate-13 09/13/2019   Pneumococcal Polysaccharide-23 05/21/2009   Tdap 06/29/2017   Zoster, Live 12/11/2014   Patient and her husband have been eating later in the day than usual, around 7-8pm for dinner and this  is the biggest meal of the day. Work has slowed down for both of them so they plan to focus on paying attention to meals and carb intake. Discussed trying to eat earlier in the day to lower evening blood sugars. She has noticed that rice and potatoes affect her blood sugars more. Patient reports her and her husband have been for more walks  lately and will try to do this more frequently.  Conditions to be addressed/monitored:  Hypertension, Hyperlipidemia, Diabetes, GERD, Overactive Bladder and Pain  Conditions addressed this visit: Diabetes, hypertension   Care Plan : CCM Pharmacy Care Plan  Updates made by Viona Gilmore, [place] since 12/25/2020 12:00 AM     Problem: Problem: Hypertension, Hyperlipidemia, Diabetes, GERD, Overactive Bladder and Pain      Long-Range Goal: Patient-Specific Goal   Start Date: 05/31/2020  Expected End Date: 05/31/2021  Recent Progress: On track  Priority: High  Note:   Current Barriers:  Unable to independently afford treatment regimen Unable to independently monitor therapeutic efficacy Unable to achieve control of diabetes   Pharmacist Clinical Goal(s):  Patient will verbalize ability to afford treatment regimen achieve adherence to monitoring guidelines and medication adherence to achieve therapeutic efficacy achieve control of diabetes as evidenced by A1c  through collaboration with PharmD and provider.   Interventions: 1:1 collaboration with [name], MD regarding development and update of comprehensive plan of care as evidenced by provider attestation and co-signature Inter-disciplinary care team collaboration (see longitudinal plan of care) Comprehensive medication review performed; medication list updated in electronic medical record  Hypertension (BP goal <130/80) -Uncontrolled -Current treatment: Lisinopril - HCTZ 10-12.5 mg 1 tablet daily -Medications previously tried: none  -Current home readings: does not check -Current  dietary habits: does add salt to food -Current exercise habits: yardwork but no formal exercsie -Denies hypotensive/hypertensive symptoms -Educated on Daily salt intake goal < 2300 mg; Exercise goal of 150 minutes per week; Importance of home blood pressure monitoring; Proper BP monitoring technique; -Counseled to monitor BP at home weekly, document, and provide log at future appointments -Counseled on diet and exercise extensively Recommended to continue current medication Recommended lower sodium salt substitute  Hyperlipidemia: (LDL goal < 100) -Controlled -Current treatment: Rosuvastatin 20 mg 1 tablet daily -Medications previously tried: none  -Current dietary patterns: eating take out fast food which includes fried foods; uses canola oil when cooking -Current exercise habits: yardwork but no formal exercise -Educated on Cholesterol goals;  Benefits of statin for ASCVD risk reduction; Importance of limiting foods high in cholesterol; Exercise goal of 150 minutes per week; -Counseled on diet and exercise extensively Recommended to continue current medication  Diabetes (A1c goal <7%) -Uncontrolled -Current medications: Novolog 100 unit/mL inject 7 units plus sliding scale Tresiba 70 units daily -Medications previously tried: pioglitazone (edema) -Current home glucose readings: downloaded CGM data fasting glucose: overnight high blood sugars post prandial glucose: after meals high blood sugars -Denies hypoglycemic/hyperglycemic symptoms -Current meal patterns:  breakfast: 2 eggs, 2 strips of bacon, small bowl of grits or hash browns & gravy lunch: chicken nuggets or hot dog or cheeseburger or fish sandwich or nothing dinner: biggest meal: meat, vegetable, creamed potatoes or mac n'cheese snacks: did not discuss drinks: diet drinks -Current exercise: yardwork and lawnmowing -Educated on A1c and blood sugar goals; Exercise goal of 150 minutes per week; Continuous glucose  monitoring; Carbohydrate counting and/or plate method -Counseled to check feet daily and get yearly eye exams -Counseled on diet and exercise extensively Recommended increasing base Novolog by 2 units to 9 units plus sliding scale  GERD (Goal: minimize symptoms of heartburn) -Controlled -Current treatment  Omeprazole 40 mg 1 capsule daily -Medications previously tried: none  -Counseled on non-pharmacologic management of symptoms such as elevating the head of your bed, avoiding eating 2-3 hours before bed, avoiding triggering foods such as acidic, spicy, or fatty foods, eating smaller  meals, and wearing clothes that are loose around the waist  Overactive bladder (Goal: minimize symptoms) -Controlled -Current treatment  Oxybutynin XL 5 mg 1 tablet daily at bedtime as needed (taking in the morning sometimes) -Medications previously tried: none  - Patient only takes as needed  Pain (Goal: minimize symptoms) -Controlled -Current treatment  Diclofenac 50 mg 1 tablet three times daily - was taking twice a day Gabapentin 100 mg 1 capsule at bedtime as needed  Methocarbamol 500 mg 1 tablet every 8 hours as needed - takes it in AM or sometimes in PM -Medications previously tried: none  -Recommended to continue current medication Patient just started physical therapy this week.   Health Maintenance -Vaccine gaps: shingrix, COVID booster -Current therapy:  Vitamin C 1000 mg 1 tablet daily Aspirin 81 mg 1 tablet daily  Betamethasone 0.05% ointment - poison oak Clotrimazole-betamethasone - yeast, during the summer Magnesium 400 mg 1 capsule daily -Educated on Cost vs benefit of each product must be carefully weighed by individual consumer -Patient is satisfied with current therapy and denies issues -Recommended to continue current medication  Patient Goals/Self-Care Activities Patient will:  - take medications as prescribed check glucose with Dexcom, document, and provide at future  appointments check blood pressure weekly, document, and provide at future appointments target a minimum of 150 minutes of moderate intensity exercise weekly  Follow Up Plan: The care management team will reach out to the patient again over the next 30 days.        Medication Assistance:  Tyler Aas and Novolog obtained through [employer] medication assistance program.  Enrollment ends 01/25/21  Compliance/Adherence/Medication fill history: Care Gaps: Shingrix, COVID booster, foot exam, colonoscopy, influenza vaccine Last PCP BP: 124/80 P: 71 on 09/16/20 Last A1C: 8.1 on 09/16/2020   Star-Rating Drugs: Lisinopril-hydrochlorothiazide 10-12.33m - last filled on 11/26/2020 90[facility] at [facility] Rosuvastatin 242m- last filled on 11/13/2020 [facility]  [facility] ([facility]all[address]hone: [phone]: [phone] ([facility]) - [place] [id number]: [phone]: [phone] pill box? Yes - once a week Pt endorses 100% compliance   We discussed: Current pharmacy is preferred with insurance plan and patient is satisfied with pharmacy services Patient decided to: Continue current medication management strategy  Care Plan and Follow Up Patient Decision:  Patient agrees to Care Plan and Follow-up.  Plan: Telephone follow up appointment with care management team member scheduled for:  1-2 months  MaJeni SallesPharmD BCSt Joseph Mercy Oaklandlinical Pharmacist LeHartwellt BrWet Camp [phone]

## 2020-12-11 ENCOUNTER — Telehealth: Payer: Self-pay | Admitting: Pharmacist

## 2020-12-11 NOTE — Chronic Care Management (AMB) (Signed)
    Chronic Care Management Pharmacy Assistant   Name: LAWRENCIA MAUNEY  MRN: [id number] DOB: 1950/08/30  Reason for Encounter: Patient assistance renewal of Tresiba and Novolog. Spoke with patient, she states she delivered all her income information to renewal of [place] and Novolog to [employer] at the office this past week.  Care Gaps: AWV - canceled 10/24/20 Covid-19 vaccine - overdue Colonoscopy - overdue  Flu vaccine - due Foot exam - overdue Pneumovax - overdue Last PCP BP: 124/80 P: 71 on 09/16/20 Last A1C: 8.1 on 09/16/2020  Star Rating Drugs: Lisinopril-hydrochlorothiazide 10-12.5mg  - last filled on 11/26/2020 90DS at [facility] Rosuvastatin 20mg  - last filled on 11/13/2020 90DS at [place] [phone]

## 2020-12-12 DIAGNOSIS — Z794 Long term (current) use of insulin: Secondary | ICD-10-CM | POA: Diagnosis not present

## 2020-12-12 DIAGNOSIS — E109 Type 1 diabetes mellitus without complications: Secondary | ICD-10-CM | POA: Diagnosis not present

## 2020-12-25 DIAGNOSIS — I10 Essential (primary) hypertension: Secondary | ICD-10-CM | POA: Diagnosis not present

## 2020-12-25 DIAGNOSIS — Z794 Long term (current) use of insulin: Secondary | ICD-10-CM | POA: Diagnosis not present

## 2020-12-25 DIAGNOSIS — E109 Type 1 diabetes mellitus without complications: Secondary | ICD-10-CM | POA: Diagnosis not present

## 2020-12-25 NOTE — Patient Instructions (Addendum)
Hi Najae,  It was great to get to catch up with you!  Here is the diabetes food website we talked about that has a lot of recipes:  GoldStates.com.pt  Let's go up on your sliding scale to a base of 9 units and check back in one month.  Please reach out to me if you have any questions or need anything before our follow up!  Best, Maddie  [name], PharmD, [place] Pharmacist [facility] [place]

## 2020-12-31 ENCOUNTER — Telehealth: Payer: Self-pay | Admitting: Pharmacist

## 2020-12-31 NOTE — Chronic Care Management (AMB) (Signed)
Chronic Care Management Pharmacy Assistant   Name: Katherine Costa  MRN: [id number] DOB: 12-Nov-1950  Reason for Encounter: Disease State   Conditions to be addressed/monitored: HLD and DMII   Recent office visits:  None  Recent consult visits:  None  Hospital visits:  None  Medications: Outpatient Encounter Medications as of 12/31/2020  Medication Sig   Ascorbic Acid (VITAMIN C) 1000 MG tablet Take 1,000 mg by mouth daily.   aspirin 81 MG EC tablet Take 81 mg by mouth daily. Swallow whole.   augmented betamethasone dipropionate (DIPROLENE) 0.05 % ointment Apply topically 3 (three) times daily as needed. rash   clotrimazole-betamethasone (LOTRISONE) cream APPLY TOPICALLY THREE TIMES A DAY AS NEEDED FOR RASH.   Continuous Blood Gluc Receiver (DEXCOM G6 RECEIVER) DEVI 1 Device by Does not apply route as needed.   Continuous Blood Gluc Sensor (DEXCOM G6 SENSOR) MISC 1 Device by Does not apply route as needed.   Continuous Blood Gluc Transmit (DEXCOM G6 TRANSMITTER) MISC 1 transmitter every 10 days   insulin degludec (TRESIBA FLEXTOUCH) 100 UNIT/ML FlexTouch Pen Sample  exp: 03/2021 and lot [id number].   insulin lispro (HUMALOG) 100 UNIT/ML injection Inject into the skin. Inject 5-7 units as needed   lisinopril-hydrochlorothiazide (ZESTORETIC) 10-12.5 MG tablet TAKE 1 TABLET BY MOUTH  DAILY   Magnesium 400 MG CAPS Take by mouth daily.   methocarbamol (ROBAXIN) 500 MG tablet Take 1 tablet (500 mg total) by mouth every 8 (eight) hours as needed for muscle spasms.   omeprazole (PRILOSEC) 40 MG capsule Take 1 capsule (40 mg total) by mouth daily.   oxybutynin (DITROPAN-XL) 5 MG 24 hr tablet TAKE ONE TABLET BY MOUTH AT BEDTIME   rosuvastatin (CRESTOR) 20 MG tablet Take 1 tablet (20 mg total) by mouth daily.   No facility-administered encounter medications on file as of 12/31/2020.  Fill History: LISINOPRIL/HYDROCHLOROTHIAZIDE  10-12.5 MG TABS 11/26/2020 90   OMEPRAZOLE  40 MG CPDR  12/18/2020 90   ROSUVASTATIN CALCIUM  20 MG TABS 11/13/2020 90   12/31/2020 Name: Katherine Costa MRN: [id number] DOB: 1950/01/31 Katherine Costa is a 70 y.o. year old female who is a primary care patient of Katherine Macadam, MD.  Comprehensive medication review performed; Spoke to patient regarding cholesterol  Lipid Panel    Component Value Date/Time   CHOL 167 09/16/2020 0945   TRIG 83.0 09/16/2020 0945   TRIG 61 02/09/2006 0727   HDL 76.10 09/16/2020 0945   LDLCALC 74 09/16/2020 0945   LDLCALC 164 (H) 09/28/2019 0721   LDLDIRECT 110.6 09/02/2011 1144    10-year ASCVD risk score: The 10-year ASCVD risk score (Arnett DK, et al., 2019) is: 15.3%   Values used to calculate the score:     Age: 33 years     Sex: Female     Is Non-Hispanic African American: No     Diabetic: Yes     Tobacco smoker: No     Systolic Blood Pressure: 321 mmHg     Is BP treated: Yes     HDL Cholesterol: 76.1 mg/dL     Total Cholesterol: 167 mg/dL  Current antihyperlipidemic regimen:  Rosuvastatin 20 mg daily  Previous antihyperlipidemic medications tried: Simvastatin  ASCVD risk enhancing conditions: age >21, DM, and HTN  What recent interventions/DTPs have been made by any provider to improve Cholesterol control since last CPP Visit: None  Any recent hospitalizations or ED visits since last visit with CPP? No  What diet changes have  been made to improve Cholesterol?  Patient is following a low carb diet, for breakfast she will have an egg and bacon, for lunch she will have lunch meat (bologna) and cheese and for dinner she will have a meat, vegetable and a starch.   What exercise is being done to improve Cholesterol?  Patient is very active running and playing with 7 great grandchildren and yard work.   Adherence Review: Does the patient have >5 day gap between last estimated fill dates? No   Recent Relevant Labs: Lab Results  Component Value Date/Time   HGBA1C 8.1 (A) 09/16/2020  08:45 AM   HGBA1C 9.2 (H) 06/19/2020 07:17 AM   HGBA1C 10.6 (H) 03/18/2020 09:49 AM   MICROALBUR <0.7 03/18/2020 09:49 AM   MICROALBUR 0.6 09/28/2019 07:21 AM    Kidney Function Lab Results  Component Value Date/Time   CREATININE 0.81 09/16/2020 09:45 AM   CREATININE 0.81 06/19/2020 07:17 AM   CREATININE 0.79 09/28/2019 07:21 AM   CREATININE 0.77 04/21/2013 05:23 PM   GFR 74.78 09/16/2020 09:45 AM   GFRNONAA >60 09/14/2015 04:29 AM   GFRAA >60 09/14/2015 04:29 AM    Current antihyperglycemic regimen:  Tyler Aas Flextouch100 mg/ml Humalog 100 um/ml 5-7 unit as needed  What recent interventions/DTPs have been made to improve glycemic control:  None  Have there been any recent hospitalizations or ED visits since last visit with CPP? No  Patient denies hypoglycemic symptoms, including None  Patient denies hyperglycemic symptoms, including none  How often are you checking your blood sugar? Often, she has a Dexcon 6  What are your blood sugars ranging?  After meals: Last few readings have been 122, 95, 123   During the week, how often does your blood glucose drop below 70?  Dexcon 6  Are you checking your feet daily/regularly? Yes  Adherence Review: Is the patient currently on a STATIN medication? Yes Is the patient currently on ACE/ARB medication? Yes Does the patient have >5 day gap between last estimated fill dates? No   Care Gaps: AWV - canceled 10/24/20 Shingrix - never done Covid-19 vaccine - overdue Colonoscopy - overdue  Eye exam - overdue Flu vaccine- overdue Pneumovax - overdue Last PCP BP: 124/80 P: 71 on 09/16/20 Last A1C: 8.1 on 09/16/2020, per patient A1C was checked yesterday with home nurse and it was 7.0  Star Rating Drugs: Lisinopril-hydrochlorothiazide 10-12.5mg  - last filled on 11/26/2020 90DS at [facility] Rosuvastatin 20mg  - last filled on 11/13/2020 90DS at [place] [phone]

## 2021-01-07 ENCOUNTER — Other Ambulatory Visit: Payer: Self-pay | Admitting: Family Medicine

## 2021-01-22 ENCOUNTER — Telehealth: Payer: Self-pay | Admitting: Pharmacist

## 2021-01-22 ENCOUNTER — Encounter: Payer: Self-pay | Admitting: Family Medicine

## 2021-01-22 NOTE — Chronic Care Management (AMB) (Addendum)
° ° °  Chronic Care Management Pharmacy Assistant   Name: Katherine Costa  MRN: [id number] DOB: Nov 29, 1950  01/24/2021 APPOINTMENT REMINDER  Katherine Costa was reminded to have all medications, supplements and any blood glucose and blood pressure readings available for review with [name], Pharm. D, at her telephone visit on 01/24/2021 at 11:30.   Questions: Have you had any recent office visit or specialist visit outside of [facility]? Patient has not been seen outside of [facility]  Are there any concerns you would like to discuss during your office visit? Patient denies any concerns.  Are you having any problems obtaining your medications? (Whether it pharmacy issues or cost) Patient denies any issues getting medications.   If patient has any PAP medications ask if they are having any problems getting their PAP medication or refill? Patient is getting [place] via PAP without any issues.  Care Gaps: AWV - canceled 10/24/20 Shingrix - never done Covid-19 vaccine - overdue Colonoscopy - overdue  Eye exam - overdue Flu vaccine- overdue Pneumovax - overdue Last PCP BP: 124/80 P: 71 on 09/16/20 Last A1C: 8.1 on 09/16/2020  Star Rating Drug: Lisinopril-hydrochlorothiazide 10-12.5mg  - last filled on 11/26/2020 90DS at [facility] Rosuvastatin 20mg  - last filled on 11/13/2020 [facility] at [facility]  Any gaps in medications fill history?  [place] Pharmacist Assistant [phone]

## 2021-01-24 ENCOUNTER — Ambulatory Visit (INDEPENDENT_AMBULATORY_CARE_PROVIDER_SITE_OTHER): Payer: Medicare Other | Admitting: Pharmacist

## 2021-01-24 DIAGNOSIS — E109 Type 1 diabetes mellitus without complications: Secondary | ICD-10-CM

## 2021-01-24 DIAGNOSIS — I10 Essential (primary) hypertension: Secondary | ICD-10-CM

## 2021-01-24 NOTE — Patient Instructions (Addendum)
Hi Beulah!  I'm glad you have dodged the flu so far! Like we discussed, decrease to a base of 7 units at breakfast with the sliding scale and keep 9 units base for the other meals. If you are going to snack in the evening, you may want to inject 3-4 units of Novolog as well to keep those readings lower.  And go ahead and get that flu shot as soon as you can as well.  Happy New Year!  Best, Maddie

## 2021-01-24 NOTE — Progress Notes (Signed)
Chronic Care Management Pharmacy Note  01/24/2021 Name:  Katherine Costa MRN:  [id number] DOB:  [birth year]  Summary: BGs are low after breakfast per Dexcom CGM report and pt is overcorrecting some   Recommendations/Changes made from today's visit: -Recommended using sliding scale with the decreasing to a base of 7 units with breakfast and 9 with lunch and dinner -Recommended against overcorrection for low blood sugars -Recommended injecting 3-4 units for bedtime snack  Plan: -Follow up in 1 month -Scheduled CPE for March  Subjective: Katherine Costa is an 70 y.o. year old female who is a primary patient of Koberlein, Steele Berg, MD.  The CCM team was consulted for assistance with disease management and care coordination needs.    Engaged with patient by telephone for follow up visit in response to provider referral for pharmacy case management and/or care coordination services.   Consent to Services:  The patient was given information about Chronic Care Management services, agreed to services, and gave verbal consent prior to initiation of services.  Please see initial visit note for detailed documentation.   Patient Care Team: [name], MD as PCP - General (Family Medicine) Maisie Fus, MD as Attending Physician (Obstetrics and Gynecology) Lorelle Gibbs, MD (Radiology) Viona Gilmore, [facility] as Pharmacist (Pharmacist)  Recent office visits: 09/16/20 Micheline Rough MD-  Presented for chronic conditions follow up. A1c decreased to 8.1%. Recommended base of 5 units for meal times even if blood sugars are between 80-100.  06/26/20 Micheline Rough MD-  Presented for chronic conditions follow up. LDL now at goal of < 100. Patient increased her Tyler Aas to 80 units on her own. Follow up in 3 months.  Recent consult visits: 05/13/20 Jerel Shepherd MD (General Surgery) - presented for post op exam. Lesions removed from face and scapula. No medication changes and  follow up as needed.    05/09/20 Basil Dess MD (Orthopedic Surgery) - presented to clinic for spondylosis and neck pain. Patient started on gabapentin 112m daily at bedtime, diclofenac potassium 518mthree times daily. Changed methocarbamol 50026mrom every 6 hours to every 8 hours as needed. Placed referral to physical therapy. Follow up in 4 weeks.   04/23/20 MicJerel ShepherdD (surgical specialists): Presented for excision removal of multiple lesions on face.  Hospital visits: None in previous 6 months  Objective:  Lab Results  Component Value Date   CREATININE 0.81 09/16/2020   BUN 16 09/16/2020   GFR 74.78 09/16/2020   GFRNONAA >60 09/14/2015   GFRAA >60 09/14/2015   NA 137 09/16/2020   K 4.9 09/16/2020   CALCIUM 9.2 09/16/2020   CO2 28 09/16/2020   GLUCOSE 207 (H) 09/16/2020    Lab Results  Component Value Date/Time   HGBA1C 8.1 (A) 09/16/2020 08:45 AM   HGBA1C 9.2 (H) 06/19/2020 07:17 AM   HGBA1C 10.6 (H) 03/18/2020 09:49 AM   FRUCTOSAMINE 337 (H) 09/29/2017 10:05 AM   FRUCTOSAMINE 339 (H) 06/29/2017 10:57 AM   GFR 74.78 09/16/2020 09:45 AM   GFR 74.91 06/19/2020 07:17 AM   MICROALBUR <0.7 03/18/2020 09:49 AM   MICROALBUR 0.6 09/28/2019 07:21 AM    Last diabetic Eye exam: No results found for: HMDIABEYEEXA  Last diabetic Foot exam: No results found for: HMDIABFOOTEX   Lab Results  Component Value Date   CHOL 167 09/16/2020   HDL 76.10 09/16/2020   LDLCALC 74 09/16/2020   LDLDIRECT 110.6 09/02/2011   TRIG 83.0 09/16/2020   CHOLHDL 2 09/16/2020  Hepatic Function Latest Ref Rng & Units 09/16/2020 06/19/2020 03/18/2020  Total Protein 6.0 - 8.3 g/dL 6.3 6.1 7.0  Albumin 3.5 - 5.2 g/dL 3.6 3.7 3.8  AST 0 - 37 U/L _0 ALT 0 - 35 U/L _1 Alk Phosphatase 39 - 117 U/L 112 101 123(H)  Total Bilirubin 0.2 - 1.2 mg/dL 0.6 0.6 0.6  Bilirubin, Direct 0.0 - 0.3 mg/dL - - -    Lab Results  Component Value Date/Time   TSH 1.90 09/28/2019 07:21 AM   TSH  1.31 06/29/2018 08:01 AM    CBC Latest Ref Rng & Units 09/16/2020 04/11/2020 03/18/2020  WBC 4.0 - 10.5 K/uL 6.3 5.8 5.4  Hemoglobin 12.0 - 15.0 g/dL 14.0 14.2 14.5  Hematocrit 36.0 - 46.0 % 41.6 43.7 43.4  Platelets 150.0 - 400.0 K/uL 259.0 282 307.0    No results found for: VD25OH  Clinical ASCVD: No  The 10-year ASCVD risk score (Arnett DK, et al., 2019) is: 15.3%   Values used to calculate the score:     Age: 39 years     Sex: Female     Is Non-Hispanic African American: No     Diabetic: Yes     Tobacco smoker: No     Systolic Blood Pressure: 834 mmHg     Is BP treated: Yes     HDL Cholesterol: 76.1 mg/dL     Total Cholesterol: 167 mg/dL    Depression screen [facility] 2/9 03/18/2020 02/04/2018  Decreased Interest 0 0  Down, Depressed, Hopeless 0 0  PHQ - 2 Score 0 0      Social History   Tobacco Use  Smoking Status Never  Smokeless Tobacco Never   BP Readings from Last 3 Encounters:  09/16/20 124/80  06/26/20 132/68  05/09/20 (!) 142/79   Pulse Readings from Last 3 Encounters:  09/16/20 71  06/26/20 75  05/09/20 81   Wt Readings from Last 3 Encounters:  09/16/20 249 lb 8 oz (113.2 kg)  06/26/20 249 lb (112.9 kg)  05/09/20 250 lb (113.4 kg)   BMI Readings from Last 3 Encounters:  09/16/20 41.52 kg/m  06/26/20 41.44 kg/m  05/09/20 41.60 kg/m    Assessment/Interventions: Review of patient past medical history, allergies, medications, health status, including review of consultants reports, laboratory and other test data, was performed as part of comprehensive evaluation and provision of chronic care management services.   SDOH:  (Social Determinants of Health) assessments and interventions performed: Yes   SDOH Screenings   Alcohol Screen: Not on file  Depression (PHQ2-9): Low Risk    PHQ-2 Score: 0  Financial Resource Strain: Medium Risk   Difficulty of Paying Living Expenses: Somewhat hard  Food Insecurity: Not on file  Housing: Not on file  Physical  Activity: Not on file  Social Connections: Not on file  Stress: Not on file  Tobacco Use: Low Risk    Smoking Tobacco Use: Never   Smokeless Tobacco Use: Never   Passive Exposure: Not on file  Transportation Needs: No Transportation Needs   Lack of Transportation (Medical): No   Lack of Transportation (Non-Medical): No   CCM Care Plan  Allergies  Allergen Reactions   Pioglitazone     REACTION: Edema    Medications Reviewed Today     Reviewed by Agnes Lawrence, CMA (Certified Medical Assistant) on 09/16/20 at [phone]  Med List Status: <None>   Medication Order Taking? Sig Documenting Provider Last Dose Status Informant  Ascorbic Acid (VITAMIN C) 1000 MG tablet [id number] Yes Take 1,000 mg by mouth daily. [provider] Taking Active   aspirin 81 MG EC tablet [id number] Yes Take 81 mg by mouth daily. Swallow whole. [provider] Taking Active   augmented betamethasone dipropionate (DIPROLENE) 0.05 % ointment [id number] Yes Apply topically 3 (three) times daily as needed. rash Renato Shin, MD Taking Active   clotrimazole-betamethasone Donalynn Furlong) cream [id number] Yes APPLY TOPICALLY THREE TIMES A DAY AS NEEDED FOR RASH. [name], MD Taking Active   Continuous Blood Gluc Receiver (Ricardo) DEVI [id number] Yes 1 Device by Does not apply route as needed. [name], MD Taking Active   Continuous Blood Gluc Sensor (DEXCOM G6 SENSOR) MISC [id number] Yes 1 Device by Does not apply route as needed. [name], MD Taking Active   Continuous Blood Gluc Transmit (DEXCOM G6 TRANSMITTER) MISC [id number] Yes 1 transmitter every 10 days [name], MD Taking Active   insulin degludec (TRESIBA FLEXTOUCH) 100 UNIT/ML FlexTouch Pen [id number] Yes Sample  exp: 03/2021 and lot [id number]. [name], MD Taking Active   insulin lispro (HUMALOG) 100 UNIT/ML injection [id number] Yes Inject into the skin. Inject 5-7 units as needed [provider] Taking Active   lisinopril-hydrochlorothiazide (ZESTORETIC) 10-12.5 MG tablet [id number] Yes Take 1 tablet by mouth daily. [name], MD Taking Active   Magnesium 400 MG CAPS [id number] Yes Take by mouth daily. [provider] Taking Active   methocarbamol (ROBAXIN) 500 MG tablet [id number] Yes Take 1 tablet (500 mg total) by mouth every 8 (eight) hours as needed for muscle spasms. Jessy Oto, MD Taking Active   omeprazole (PRILOSEC) 40 MG capsule [id number] Yes Take 1 capsule (40 mg total) by mouth daily. [name], MD Taking Active   oxybutynin (DITROPAN-XL) 5 MG 24 hr tablet [id number] Yes TAKE ONE TABLET BY MOUTH AT BEDTIME Koberlein, Junell C, MD Taking Active   rosuvastatin (CRESTOR) 20 MG tablet [id number] Yes Take 1 tablet (20 mg total) by mouth daily. [name], MD Taking Active             Patient Active Problem List   Diagnosis Date Noted   Urinary incontinence 06/24/2018   Gastroesophageal reflux disease 02/11/2018   Primary localized osteoarthritis of left knee 09/13/2015   Pain in joint, shoulder region 06/01/2014   Morbid obesity ([place]) 04/21/2013   Encounter for long-term (current) use of other medications 09/02/2011   LIVER DISORDER 03/17/2010   ASYMPTOMATIC POSTMENOPAUSAL STATUS 05/21/2009   HERPES LABIALIS 03/13/2009   HYPERCHOLESTEROLEMIA 02/09/2008   Type 1 diabetes mellitus ([place]) 08/24/2006   Essential hypertension 08/24/2006    Immunization History  Administered Date(s) Administered   Fluad Quad(high Dose 65+) 10/14/2018, 03/18/2020   Influenza Whole 10/26/2008, 12/06/2009, 10/02/2010   Influenza, High Dose Seasonal PF 10/29/2017   Influenza,inj,Quad PF,6+ Mos 10/31/2013   Influenza-Unspecified 11/16/2015, 11/17/2016   PFIZER(Purple Top)SARS-COV-2 Vaccination 02/26/2019, 03/19/2019, 01/04/2020   Pneumococcal Conjugate-13 09/13/2019   Pneumococcal Polysaccharide-23 05/21/2009   Tdap 06/29/2017   Zoster,  Live 12/11/2014   Patient reports she had been very busy over the last few weeks because she had a death in the family and has been busy with that. She reports she has been consistent with meals but has been more active lately which could be why her readings are bouncing around a lot more.  Dexcom CGM report below:     Conditions to be addressed/monitored:  Hypertension, Hyperlipidemia, Diabetes,  GERD, Overactive Bladder and Pain  Conditions addressed this visit: Diabetes, hypertension   Care Plan : [place]  Updates made by Viona Gilmore, [place] since 01/24/2021 12:00 AM     Problem: Problem: Hypertension, Hyperlipidemia, Diabetes, GERD, Overactive Bladder and Pain      Long-Range Goal: Patient-Specific Goal   Start Date: 05/31/2020  Expected End Date: 05/31/2021  Recent Progress: On track  Priority: High  Note:   Current Barriers:  Unable to independently afford treatment regimen Unable to independently monitor therapeutic efficacy Unable to achieve control of diabetes   Pharmacist Clinical Goal(s):  Patient will verbalize ability to afford treatment regimen achieve adherence to monitoring guidelines and medication adherence to achieve therapeutic efficacy achieve control of diabetes as evidenced by A1c  through collaboration with PharmD and provider.   Interventions: 1:1 collaboration with [name], MD regarding development and update of comprehensive plan of care as evidenced by provider attestation and co-signature Inter-disciplinary care team collaboration (see longitudinal plan of care) Comprehensive medication review performed; medication list updated in electronic medical record  Hypertension (BP goal <130/80) -Uncontrolled -Current treatment: Lisinopril - HCTZ 10-12.5 mg 1 tablet daily -Medications previously tried: none  -Current home readings: does not check -Current dietary habits: does add salt to food -Current exercise habits:  yardwork but no formal exercsie -Denies hypotensive/hypertensive symptoms -Educated on Daily salt intake goal < 2300 mg; Exercise goal of 150 minutes per week; Importance of home blood pressure monitoring; Proper BP monitoring technique; -Counseled to monitor BP at home weekly, document, and provide log at future appointments -Counseled on diet and exercise extensively Recommended to continue current medication Recommended lower sodium salt substitute  Hyperlipidemia: (LDL goal < 100) -Controlled -Current treatment: Rosuvastatin 20 mg 1 tablet daily -Medications previously tried: none  -Current dietary patterns: eating take out fast food which includes fried foods; uses canola oil when cooking -Current exercise habits: yardwork but no formal exercise -Educated on Cholesterol goals;  Benefits of statin for ASCVD risk reduction; Importance of limiting foods high in cholesterol; Exercise goal of 150 minutes per week; -Counseled on diet and exercise extensively Recommended to continue current medication  Diabetes (A1c goal <7%) -Uncontrolled -Current medications: Novolog 100 unit/mL inject 9 units plus sliding scale Tresiba 70 units daily -Medications previously tried: pioglitazone (edema) -Current home glucose readings: downloaded CGM data fasting glucose: some overnight high blood sugars post prandial glucose: after meals high blood sugars -Denies hypoglycemic/hyperglycemic symptoms -Current meal patterns:  breakfast: 2 eggs, 2 strips of bacon, small bowl of grits or hash browns & gravy lunch: chicken nuggets or hot dog or cheeseburger or fish sandwich or nothing dinner: biggest meal: meat, vegetable, creamed potatoes or mac n'cheese snacks: some late night snacking drinks: diet drinks -Current exercise: busy with death in the family -Educated on A1c and blood sugar goals; Exercise goal of 150 minutes per week; Continuous glucose monitoring; Carbohydrate counting and/or  plate method -Counseled to check feet daily and get yearly eye exams -Counseled on diet and exercise extensively Recommended decreasing base Novolog to 7 units plus sliding scale at breakfast and keeping 9 units plus sliding scale for other meals.  GERD (Goal: minimize symptoms of heartburn) -Controlled -Current treatment  Omeprazole 40 mg 1 capsule daily -Medications previously tried: none  -Counseled on non-pharmacologic management of symptoms such as elevating the head of your bed, avoiding eating 2-3 hours before bed, avoiding triggering foods such as acidic, spicy, or fatty foods, eating smaller meals, and wearing clothes  that are loose around the waist  Overactive bladder (Goal: minimize symptoms) -Controlled -Current treatment  Oxybutynin XL 5 mg 1 tablet daily at bedtime as needed (taking in the morning sometimes) -Medications previously tried: none  - Patient only takes as needed  Pain (Goal: minimize symptoms) -Controlled -Current treatment  Diclofenac 50 mg 1 tablet three times daily - was taking twice a day Gabapentin 100 mg 1 capsule at bedtime as needed  Methocarbamol 500 mg 1 tablet every 8 hours as needed - takes it in AM or sometimes in PM -Medications previously tried: none  -Recommended to continue current medication Patient just started physical therapy this week.   Health Maintenance -Vaccine gaps: shingrix, COVID booster -Current therapy:  Vitamin C 1000 mg 1 tablet daily Aspirin 81 mg 1 tablet daily  Betamethasone 0.05% ointment - poison oak Clotrimazole-betamethasone - yeast, during the summer Magnesium 400 mg 1 capsule daily -Educated on Cost vs benefit of each product must be carefully weighed by individual consumer -Patient is satisfied with current therapy and denies issues -Recommended to continue current medication  Patient Goals/Self-Care Activities Patient will:  - take medications as prescribed check glucose with Dexcom, document, and  provide at future appointments check blood pressure weekly, document, and provide at future appointments target a minimum of 150 minutes of moderate intensity exercise weekly  Follow Up Plan: The care management team will reach out to the patient again over the next 30 days.         Medication Assistance:  Tyler Aas and Novolog obtained through [employer] medication assistance program.  Enrollment ends 01/25/21  Compliance/Adherence/Medication fill history: Care Gaps: Shingrix, COVID booster, foot exam, colonoscopy, influenza vaccine Last PCP BP: 124/80 P: 71 on 09/16/20 Last A1C: 8.1 on 09/16/2020   Star-Rating Drugs: Lisinopril-hydrochlorothiazide 10-12.34m - last filled on 11/26/2020 [facility]DS [facility] Rosuvastatin 228m- [facility]  [facility] (OpWanetteCACarthage[id number]: [phone]: [phone] ([facility] ) - [address] [id number]: [phone]: [phone] Uses pill box? Yes - once a week Pt endorses 100% compliance   We discussed: Current pharmacy is preferred with insurance plan and patient is satisfied with pharmacy services Patient decided to: Continue current medication management strategy  Care Plan and Follow Up Patient Decision:  Patient agrees to Care Plan and Follow-up.  Plan: Telephone follow up appointment with care management team member scheduled for:  1-2 months  MaJeni SallesPharmD BCRiver Vista Health And Wellness LLClinical Pharmacist LeLa Luzt [phone]

## 2021-01-25 DIAGNOSIS — E109 Type 1 diabetes mellitus without complications: Secondary | ICD-10-CM | POA: Diagnosis not present

## 2021-01-25 DIAGNOSIS — I10 Essential (primary) hypertension: Secondary | ICD-10-CM

## 2021-02-04 ENCOUNTER — Telehealth: Payer: Self-pay | Admitting: Pharmacist

## 2021-02-04 NOTE — Chronic Care Management (AMB) (Addendum)
° ° °  Chronic Care Management Pharmacy Assistant   Name: Katherine Costa  MRN: [id number] DOB: 24-May-1950   Reason for Encounter: Follow up patient assistance for [name] and Novolog.   Claiborne Billings, [place] and Novolog have been approved through 12/25/2021. They are behind with shipping and the patient should receive medications the end of Jan or beginning of Feb.  Patient notified voiced clear understanding.   03/04/2021 Spoke with Dan Europe at [employer], she states [place] (6 boxes) and Futures trader (5 boxes) are in fulfillment and due to ship soon.  Novolog was not included on the renewal application and was not approved for 2023, this will need to be added to the renewal application and faxed back to [employer] to go through the approval process. [name], clinical pharmacist notified.  Per Mickie Bail is replacing Novolog. Patient notified.   Care Gaps: AWV - canceled 10/24/20 Shingrix - never done Covid-19 vaccine - overdue Colonoscopy - overdue  Eye exam - overdue Flu vaccine- overdue Pneumovax - overdue Last PCP BP: 124/80 P: 71 on 09/16/20 Last A1C: 8.1 on 09/16/2020  Star Rating Drugs: Lisinopril-hydrochlorothiazide 10-12.5mg  - last filled on 11/26/2020 90DS at [facility] Rosuvastatin 20mg  - last filled on 11/13/2020 90DS at [place] [phone]

## 2021-02-11 ENCOUNTER — Other Ambulatory Visit: Payer: Self-pay | Admitting: Family Medicine

## 2021-02-14 ENCOUNTER — Other Ambulatory Visit: Payer: Self-pay

## 2021-02-14 ENCOUNTER — Other Ambulatory Visit: Payer: Self-pay | Admitting: Sports Medicine

## 2021-02-14 ENCOUNTER — Ambulatory Visit
Admission: RE | Admit: 2021-02-14 | Discharge: 2021-02-14 | Disposition: A | Discharge status: Home / Self Care | Payer: Medicare Other | Source: Ambulatory Visit | Attending: Sports Medicine | Admitting: Sports Medicine

## 2021-02-14 DIAGNOSIS — S8002XA Contusion of left knee, initial encounter: Secondary | ICD-10-CM | POA: Diagnosis not present

## 2021-02-14 DIAGNOSIS — M25552 Pain in left hip: Secondary | ICD-10-CM

## 2021-02-14 DIAGNOSIS — S72115A Nondisplaced fracture of greater trochanter of left femur, initial encounter for closed fracture: Secondary | ICD-10-CM | POA: Diagnosis not present

## 2021-02-14 DIAGNOSIS — M545 Low back pain, unspecified: Secondary | ICD-10-CM | POA: Diagnosis not present

## 2021-02-14 DIAGNOSIS — M1612 Unilateral primary osteoarthritis, left hip: Secondary | ICD-10-CM | POA: Diagnosis not present

## 2021-02-14 DIAGNOSIS — S93492A Sprain of other ligament of left ankle, initial encounter: Secondary | ICD-10-CM | POA: Diagnosis not present

## 2021-02-14 DIAGNOSIS — S72112A Displaced fracture of greater trochanter of left femur, initial encounter for closed fracture: Secondary | ICD-10-CM | POA: Diagnosis not present

## 2021-02-17 DIAGNOSIS — S72102A Unspecified trochanteric fracture of left femur, initial encounter for closed fracture: Secondary | ICD-10-CM | POA: Diagnosis not present

## 2021-02-25 ENCOUNTER — Telehealth: Payer: Self-pay | Admitting: Pharmacist

## 2021-02-25 NOTE — Chronic Care Management (AMB) (Signed)
° ° °  Chronic Care Management Pharmacy Assistant   Name: Katherine Costa  MRN: [id number] DOB: Feb 23, 1950  02/26/2021 APPOINTMENT REMINDER  Katherine Costa was reminded to have all medications, supplements and any blood glucose and blood pressure readings available for review with [name], Pharm. D, at her telephone visit on 02/26/2021 at 3:00.   Questions: Have you had any recent office visit or specialist visit outside of [place]? Patient fell on 02/13/2021 and broke her hip, she [facility]  Are there any concerns you would like to discuss during your office visit? Patient denies any concerns  Are you having any problems obtaining your medications? (Whether it pharmacy issues or cost) Patient denies any issues getting medications.   If patient has any PAP medications ask if they are having any problems getting their PAP medication or refill? Patient states she has received a letter and her insulin should be delivered any day.   Care Gaps: AWV - scheduled 02/28/2021 Last  BP - 124/80  on 09/16/20 Last A1C - 8.1 on 09/16/2020 Shingrix - never done  Covid booster - overdue  Colonoscopy - overdue  Eye exam - overdue  Flu vaccine- overdue  Pneumovax - overdue  Star Rating Drug: Lisinopril-HCTZ10-12.5mg  - last filled on 02/20/2021 90DS at [facility] Rosuvastatin 20mg  - last filled on 02/06/2021 90DS at [place] with [facility]  Any gaps in medications fill history? No  [name] [facility]  Catering manager [phone]

## 2021-02-26 ENCOUNTER — Ambulatory Visit (INDEPENDENT_AMBULATORY_CARE_PROVIDER_SITE_OTHER): Payer: Medicare Other | Admitting: Pharmacist

## 2021-02-26 DIAGNOSIS — E109 Type 1 diabetes mellitus without complications: Secondary | ICD-10-CM

## 2021-02-26 DIAGNOSIS — I10 Essential (primary) hypertension: Secondary | ICD-10-CM

## 2021-02-26 NOTE — Progress Notes (Signed)
Chronic Care Management Pharmacy Note  02/27/2021 Name:  Katherine Costa MRN:  [id number] DOB:  [birth year]  Summary: BGs are lower in the morning and elevated overnight per Dexcom CGM report   Recommendations/Changes made from today's visit: -Recommended using sliding scale with the decreasing to a base of 7 units with breakfast and 9 with lunch and 11 with dinner -Recommended decreasing Tresiba to 65 units to avoid morning lows -Recommended injecting 3-4 units for bedtime snack  Plan: -Follow up after CPE   Subjective: Katherine Costa is an 71 y.o. year old female who is a primary patient of Koberlein, Steele Berg, MD.  The CCM team was consulted for assistance with disease management and care coordination needs.    Engaged with patient by telephone for follow up visit in response to provider referral for pharmacy case management and/or care coordination services.   Consent to Services:  The patient was given information about Chronic Care Management services, agreed to services, and gave verbal consent prior to initiation of services.  Please see initial visit note for detailed documentation.   Patient Care Team: [name], MD as PCP - General (Family Medicine) Maisie Fus, MD as Attending Physician (Obstetrics and Gynecology) Lorelle Gibbs, MD (Radiology) Viona Gilmore, [facility] as Pharmacist (Pharmacist)  Recent office visits: 09/16/20 Micheline Rough MD-  Presented for chronic conditions follow up. A1c decreased to 8.1%. Recommended base of 5 units for meal times even if blood sugars are between 80-100.  06/26/20 Micheline Rough MD-  Presented for chronic conditions follow up. LDL now at goal of < 100. Patient increased her Tyler Aas to 80 units on her own. Follow up in 3 months.  Recent consult visits: 05/13/20 Jerel Shepherd MD (General Surgery) - presented for post op exam. Lesions removed from face and scapula. No medication changes and follow up as  needed.    05/09/20 Basil Dess MD (Orthopedic Surgery) - presented to clinic for spondylosis and neck pain. Patient started on gabapentin 121m daily at bedtime, diclofenac potassium 563mthree times daily. Changed methocarbamol 50018mrom every 6 hours to every 8 hours as needed. Placed referral to physical therapy. Follow up in 4 weeks.   04/23/20 MicJerel ShepherdD (surgical specialists): Presented for excision removal of multiple lesions on face.  Hospital visits: None in previous 6 months  Objective:  Lab Results  Component Value Date   CREATININE 0.81 09/16/2020   BUN 16 09/16/2020   GFR 74.78 09/16/2020   GFRNONAA >60 09/14/2015   GFRAA >60 09/14/2015   NA 137 09/16/2020   K 4.9 09/16/2020   CALCIUM 9.2 09/16/2020   CO2 28 09/16/2020   GLUCOSE 207 (H) 09/16/2020    Lab Results  Component Value Date/Time   HGBA1C 8.1 (A) 09/16/2020 08:45 AM   HGBA1C 9.2 (H) 06/19/2020 07:17 AM   HGBA1C 10.6 (H) 03/18/2020 09:49 AM   FRUCTOSAMINE 337 (H) 09/29/2017 10:05 AM   FRUCTOSAMINE 339 (H) 06/29/2017 10:57 AM   GFR 74.78 09/16/2020 09:45 AM   GFR 74.91 06/19/2020 07:17 AM   MICROALBUR <0.7 03/18/2020 09:49 AM   MICROALBUR 0.6 09/28/2019 07:21 AM    Last diabetic Eye exam: No results found for: HMDIABEYEEXA  Last diabetic Foot exam: No results found for: HMDIABFOOTEX   Lab Results  Component Value Date   CHOL 167 09/16/2020   HDL 76.10 09/16/2020   LDLCALC 74 09/16/2020   LDLDIRECT 110.6 09/02/2011   TRIG 83.0 09/16/2020   CHOLHDL 2 09/16/2020  Hepatic Function Latest Ref Rng & Units 09/16/2020 06/19/2020 03/18/2020  Total Protein 6.0 - 8.3 g/dL 6.3 6.1 7.0  Albumin 3.5 - 5.2 g/dL 3.6 3.7 3.8  AST 0 - 37 U/L _0 ALT 0 - 35 U/L _1 Alk Phosphatase 39 - 117 U/L 112 101 123(H)  Total Bilirubin 0.2 - 1.2 mg/dL 0.6 0.6 0.6  Bilirubin, Direct 0.0 - 0.3 mg/dL - - -    Lab Results  Component Value Date/Time   TSH 1.90 09/28/2019 07:21 AM   TSH 1.31  06/29/2018 08:01 AM    CBC Latest Ref Rng & Units 09/16/2020 04/11/2020 03/18/2020  WBC 4.0 - 10.5 K/uL 6.3 5.8 5.4  Hemoglobin 12.0 - 15.0 g/dL 14.0 14.2 14.5  Hematocrit 36.0 - 46.0 % 41.6 43.7 43.4  Platelets 150.0 - 400.0 K/uL 259.0 282 307.0    No results found for: VD25OH  Clinical ASCVD: No  The 10-year ASCVD risk score (Arnett DK, et al., 2019) is: 15.3%   Values used to calculate the score:     Age: 71 years     Sex: Female     Is Non-Hispanic African American: No     Diabetic: Yes     Tobacco smoker: No     Systolic Blood Pressure: 053 mmHg     Is BP treated: Yes     HDL Cholesterol: 76.1 mg/dL     Total Cholesterol: 167 mg/dL    Depression screen [facility] 2/9 03/18/2020 02/04/2018  Decreased Interest 0 0  Down, Depressed, Hopeless 0 0  PHQ - 2 Score 0 0      Social History   Tobacco Use  Smoking Status Never  Smokeless Tobacco Never   BP Readings from Last 3 Encounters:  09/16/20 124/80  06/26/20 132/68  05/09/20 (!) 142/79   Pulse Readings from Last 3 Encounters:  09/16/20 71  06/26/20 75  05/09/20 81   Wt Readings from Last 3 Encounters:  09/16/20 249 lb 8 oz (113.2 kg)  06/26/20 249 lb (112.9 kg)  05/09/20 250 lb (113.4 kg)   BMI Readings from Last 3 Encounters:  09/16/20 41.52 kg/m  06/26/20 41.44 kg/m  05/09/20 41.60 kg/m    Assessment/Interventions: Review of patient past medical history, allergies, medications, health status, including review of consultants reports, laboratory and other test data, was performed as part of comprehensive evaluation and provision of chronic care management services.   SDOH:  (Social Determinants of Health) assessments and interventions performed: Yes   SDOH Screenings   Alcohol Screen: Not on file  Depression (PHQ2-9): Low Risk    PHQ-2 Score: 0  Financial Resource Strain: Medium Risk   Difficulty of Paying Living Expenses: Somewhat hard  Food Insecurity: Not on file  Housing: Not on file  Physical  Activity: Not on file  Social Connections: Not on file  Stress: Not on file  Tobacco Use: Low Risk    Smoking Tobacco Use: Never   Smokeless Tobacco Use: Never   Passive Exposure: Not on file  Transportation Needs: No Transportation Needs   Lack of Transportation (Medical): No   Lack of Transportation (Non-Medical): No   CCM Care Plan  Allergies  Allergen Reactions   Pioglitazone     REACTION: Edema    Medications Reviewed Today     Reviewed by Viona Gilmore, [facility] (Pharmacist) on 02/27/21 at [place] List Status: <None>   Medication Order Taking? Sig Documenting Provider Last Dose Status Informant  Ascorbic  Acid (VITAMIN C) 1000 MG tablet [id number]  Take 1,000 mg by mouth daily. [provider]  Active   aspirin 81 MG EC tablet [id number]  Take 81 mg by mouth daily. Swallow whole. [provider]  Active   augmented betamethasone dipropionate (DIPROLENE) 0.05 % ointment [id number]  Apply topically 3 (three) times daily as needed. rash Renato Shin, MD  Active   clotrimazole-betamethasone Donalynn Furlong) cream [id number]  APPLY TOPICALLY THREE TIMES A DAY AS NEEDED FOR RASH. [name], MD  Active   Continuous Blood Gluc Receiver ([place]) DEVI [id number]  1 Device by Does not apply route as needed. [name], MD  Active   Continuous Blood Gluc Sensor (DEXCOM G6 SENSOR) MISC [id number]  1 Device by Does not apply route as needed. [name], MD  Active   Continuous Blood Gluc Transmit (DEXCOM G6 TRANSMITTER) MISC [id number]  1 transmitter every 10 days Koberlein, Junell C, MD  Active   insulin aspart (NOVOLOG) 100 UNIT/ML injection [id number] Yes Inject into the skin 3 (three) times daily before meals. Base of 7 units with breakfast, 9 units with lunch and 11 units with dinner plus the sliding scale. [provider] Taking Active   insulin degludec [employer] FLEXTOUCH) 100 UNIT/ML FlexTouch Pen [id number] Yes Sample  exp:  03/2021 and lot [id number].  Patient taking differently: Inject 65 Units into the skin daily.   [name], MD Taking Active   insulin lispro (HUMALOG) 100 UNIT/ML injection [id number]  Inject into the skin. Inject 5-7 units as needed [provider]  Active   lisinopril-hydrochlorothiazide (ZESTORETIC) 10-12.5 MG tablet [id number]  TAKE 1 TABLET BY MOUTH  DAILY Koberlein, Junell C, MD  Active   Magnesium 400 MG CAPS [id number]  Take by mouth daily. [provider]  Active   methocarbamol (ROBAXIN) 500 MG tablet [id number]  Take 1 tablet (500 mg total) by mouth every 8 (eight) hours as needed for muscle spasms. Jessy Oto, MD  Active   omeprazole (PRILOSEC) 40 MG capsule [id number]  Take 1 capsule (40 mg total) by mouth daily. [name], MD  Active   oxybutynin (DITROPAN-XL) 5 MG 24 hr tablet [id number]  TAKE ONE TABLET BY MOUTH AT BEDTIME Koberlein, Junell C, MD  Active   rosuvastatin (CRESTOR) 20 MG tablet [id number]  TAKE 1 TABLET BY MOUTH  DAILY [name], MD  Active             Patient Active Problem List   Diagnosis Date Noted   Urinary incontinence 06/24/2018   Gastroesophageal reflux disease 02/11/2018   Primary localized osteoarthritis of left knee 09/13/2015   Pain in joint, shoulder region 06/01/2014   Morbid obesity ([place]) 04/21/2013   Encounter for long-term (current) use of other medications 09/02/2011   LIVER DISORDER 03/17/2010   ASYMPTOMATIC POSTMENOPAUSAL STATUS 05/21/2009   HERPES LABIALIS 03/13/2009   HYPERCHOLESTEROLEMIA 02/09/2008   Type 1 diabetes mellitus ([place]) 08/24/2006   Essential hypertension 08/24/2006    Immunization History  Administered Date(s) Administered   Fluad Quad(high Dose 65+) 10/14/2018, 03/18/2020   Influenza Whole 10/26/2008, 12/06/2009, 10/02/2010   Influenza, High Dose Seasonal PF 10/29/2017   Influenza,inj,Quad PF,6+ Mos 10/31/2013   Influenza-Unspecified 11/16/2015, 11/17/2016    PFIZER(Purple Top)SARS-COV-2 Vaccination 02/26/2019, 03/19/2019, 01/04/2020   Pneumococcal Conjugate-13 09/13/2019   Pneumococcal Polysaccharide-23 05/21/2009   Tdap 06/29/2017   Zoster, Live 12/11/2014   Patient reports she had been very busy over the last few weeks because she  had a death in the family and has been busy with that. She reports she has been consistent with meals but has been more active lately which could be why her readings are bouncing around a lot more.  Patient slipped and fell and her left foot knocked her right foot down and she was covered in mud. She fell and broke her hip. Patient is getting around a lot better and isn't supposed to be putting a lot of pressure on her leg. She can't lay on that side. Patient goes back on Monday for repeat x-rays.  Dexcom CGM report below:     Conditions to be addressed/monitored:  Hypertension, Hyperlipidemia, Diabetes, GERD, Overactive Bladder and Pain  Conditions addressed this visit: Diabetes, hypertension   Care Plan : [place]  Updates made by Viona Gilmore, [place] since 02/27/2021 12:00 AM     Problem: Problem: Hypertension, Hyperlipidemia, Diabetes, GERD, Overactive Bladder and Pain      Long-Range Goal: Patient-Specific Goal   Start Date: 05/31/2020  Expected End Date: 05/31/2021  Recent Progress: On track  Priority: High  Note:   Current Barriers:  Unable to independently afford treatment regimen Unable to independently monitor therapeutic efficacy Unable to achieve control of diabetes   Pharmacist Clinical Goal(s):  Patient will verbalize ability to afford treatment regimen achieve adherence to monitoring guidelines and medication adherence to achieve therapeutic efficacy achieve control of diabetes as evidenced by A1c  through collaboration with PharmD and provider.   Interventions: 1:1 collaboration with [name], MD regarding development and update of comprehensive plan of care as  evidenced by provider attestation and co-signature Inter-disciplinary care team collaboration (see longitudinal plan of care) Comprehensive medication review performed; medication list updated in electronic medical record  Hypertension (BP goal <130/80) -Uncontrolled -Current treatment: Lisinopril - HCTZ 10-12.5 mg 1 tablet daily - Appropriate, Query effective, Safe, Accessible -Medications previously tried: none  -Current home readings: does not check -Current dietary habits: does add salt to food -Current exercise habits: yardwork but no formal exercsie -Denies hypotensive/hypertensive symptoms -Educated on Daily salt intake goal < 2300 mg; Exercise goal of 150 minutes per week; Importance of home blood pressure monitoring; Proper BP monitoring technique; -Counseled to monitor BP at home weekly, document, and provide log at future appointments -Counseled on diet and exercise extensively Recommended to continue current medication Recommended lower sodium salt substitute  Hyperlipidemia: (LDL goal < 70) -Not ideally controlled -Current treatment: Rosuvastatin 20 mg 1 tablet daily - Appropriate, Query effective, Safe, Accessible -Medications previously tried: none  -Current dietary patterns: eating take out fast food which includes fried foods; uses canola oil when cooking -Current exercise habits: yardwork but no formal exercise -Educated on Cholesterol goals;  Benefits of statin for ASCVD risk reduction; Importance of limiting foods high in cholesterol; Exercise goal of 150 minutes per week; -Counseled on diet and exercise extensively Recommended to continue current medication  Diabetes (A1c goal <7%) -Uncontrolled -Current medications: Novolog 100 unit/mL inject 7 units plus sliding scale with breakfast, 9 base units with lunch and 9 with dinner- Appropriate, Query effective, Safe, Accessible Tresiba 70 units daily - Appropriate, Query effective, Safe,  Accessible -Medications previously tried: pioglitazone (edema) -Current home glucose readings: downloaded CGM data fasting glucose: some overnight high blood sugars post prandial glucose: after meals high blood sugars -Denies hypoglycemic/hyperglycemic symptoms -Current meal patterns:  breakfast: 2 eggs, 2 strips of bacon, small bowl of grits or hash browns & gravy lunch: chicken nuggets or hot dog  or cheeseburger or fish sandwich or nothing dinner: biggest meal: meat, vegetable, creamed potatoes or mac n'cheese snacks: some late night snacking drinks: diet drinks -Current exercise: busy with death in the family -Educated on A1c and blood sugar goals; Exercise goal of 150 minutes per week; Continuous glucose monitoring; Carbohydrate counting and/or plate method -Counseled to check feet daily and get yearly eye exams -Counseled on diet and exercise extensively Recommended using sliding scale with the decreasing to a base of 7 units with breakfast and 9 with lunch and 11 with dinner. Recommended decreasing to 65 units of Tresiba in the evening.  GERD (Goal: minimize symptoms of heartburn) -Controlled -Current treatment  Omeprazole 40 mg 1 capsule daily - Appropriate, Effective, Query Safe, Accessible -Medications previously tried: none  -Counseled on non-pharmacologic management of symptoms such as elevating the head of your bed, avoiding eating 2-3 hours before bed, avoiding triggering foods such as acidic, spicy, or fatty foods, eating smaller meals, and wearing clothes that are loose around the waist  Overactive bladder (Goal: minimize symptoms) -Controlled -Current treatment  Oxybutynin XL 5 mg 1 tablet daily at bedtime as needed (taking in the morning sometimes) - Appropriate, Effective, Query Safe, Accessible -Medications previously tried: none  - Patient only takes as needed  Pain (Goal: minimize symptoms) -Controlled -Current treatment  Diclofenac 50 mg 1 tablet three  times daily - was taking twice a day - Appropriate, Effective, Safe, Accessible Gabapentin 100 mg 1 capsule at bedtime as needed  - Appropriate, Effective, Safe, Accessible Methocarbamol 500 mg 1 tablet every 8 hours as needed - takes it in AM or sometimes in PM - Appropriate, Effective, Safe, Accessible -Medications previously tried: none  -Recommended to continue current medication   Health Maintenance -Vaccine gaps: shingrix, COVID booster -Current therapy:  Vitamin C 1000 mg 1 tablet daily Aspirin 81 mg 1 tablet daily  Betamethasone 0.05% ointment - poison oak Clotrimazole-betamethasone - yeast, during the summer Magnesium 400 mg 1 capsule daily -Educated on Cost vs benefit of each product must be carefully weighed by individual consumer -Patient is satisfied with current therapy and denies issues -Recommended to continue current medication  Patient Goals/Self-Care Activities Patient will:  - take medications as prescribed check glucose with Dexcom, document, and provide at future appointments check blood pressure weekly, document, and provide at future appointments target a minimum of 150 minutes of moderate intensity exercise weekly  Follow Up Plan: The care management team will reach out to the patient again over the next 30 days.        Medication Assistance:  Tyler Aas and Novolog obtained through [employer] medication assistance program.  Enrollment ends 01/25/22  Compliance/Adherence/Medication fill history: Care Gaps: Shingrix, COVID booster, foot exam, colonoscopy, influenza vaccine Last PCP BP: 124/80 P: 71 on 09/16/20 Last A1C: 8.1 on 09/16/2020   Star-Rating Drugs: Lisinopril-HCTZ10-12.64m - last filled on 02/20/2021 90DS at [facility] Rosuvastatin 229m- last [facility] on [facility]/2021 [facility]:  [facility] ([id number]: [phone]: [phone] ) - [place] [id number]: [phone]: [phone] Uses pill box? Yes - once a week Pt endorses 100% compliance   We discussed: Current pharmacy is preferred with insurance plan and patient is satisfied with pharmacy  services Patient decided to: Continue current medication management strategy  Care Plan and Follow Up Patient Decision:  Patient agrees to Care Plan and Follow-up.  Plan: The care management team will reach out to the patient again over the next 30 days.  [name], PharmD [facility] Clinical Pharmacist [place] at [place]

## 2021-02-27 NOTE — Patient Instructions (Signed)
Hi Katherine Costa,  I am so sorry to hear about your fall! I hope that you feel better soon.  As we discussed, go ahead and decrease your Tresiba to 65 units and increase your Novolog to a base of 7 units with breakfast, 9 units with lunch and 11 units with dinner plus the sliding scale.  Don't forget to inject 3-4 units if you are going to have a late night snack as well.  Please reach out to me if you have any questions or need anything before our follow up!  Best, Maddie  [name], PharmD, [place] Pharmacist [place] at [place]

## 2021-02-28 ENCOUNTER — Ambulatory Visit: Payer: Medicare Other

## 2021-03-03 DIAGNOSIS — S72102D Unspecified trochanteric fracture of left femur, subsequent encounter for closed fracture with routine healing: Secondary | ICD-10-CM | POA: Diagnosis not present

## 2021-03-03 DIAGNOSIS — M25562 Pain in left knee: Secondary | ICD-10-CM | POA: Diagnosis not present

## 2021-03-03 DIAGNOSIS — S72115D Nondisplaced fracture of greater trochanter of left femur, subsequent encounter for closed fracture with routine healing: Secondary | ICD-10-CM | POA: Diagnosis not present

## 2021-03-07 DIAGNOSIS — Z794 Long term (current) use of insulin: Secondary | ICD-10-CM | POA: Diagnosis not present

## 2021-03-07 DIAGNOSIS — E109 Type 1 diabetes mellitus without complications: Secondary | ICD-10-CM | POA: Diagnosis not present

## 2021-03-14 ENCOUNTER — Telehealth: Payer: Self-pay | Admitting: Family Medicine

## 2021-03-14 NOTE — Telephone Encounter (Signed)
Order faxed to [facility] at [phone] and sent to be scanned.

## 2021-03-14 NOTE — Telephone Encounter (Signed)
Ivin Booty with [facility] called in requesting for a referral to be sent over today because the patient have an appointment on Monday.  Ivin Booty could be contacted at [phone].  Please advise.

## 2021-03-14 NOTE — Telephone Encounter (Signed)
Spoke with Ivin Booty and she requested an order for a 61-month follow up diagnostic mammogram with [place] if needed-last one performed after biopsy March 2022.  Message sent to PCP.

## 2021-03-14 NOTE — Telephone Encounter (Signed)
Fax signed.

## 2021-03-23 DIAGNOSIS — Z20822 Contact with and (suspected) exposure to covid-19: Secondary | ICD-10-CM | POA: Diagnosis not present

## 2021-03-23 DIAGNOSIS — B349 Viral infection, unspecified: Secondary | ICD-10-CM | POA: Diagnosis not present

## 2021-03-23 DIAGNOSIS — R051 Acute cough: Secondary | ICD-10-CM | POA: Diagnosis not present

## 2021-03-25 DIAGNOSIS — R0602 Shortness of breath: Secondary | ICD-10-CM | POA: Diagnosis not present

## 2021-03-25 DIAGNOSIS — I10 Essential (primary) hypertension: Secondary | ICD-10-CM | POA: Diagnosis not present

## 2021-03-25 DIAGNOSIS — R778 Other specified abnormalities of plasma proteins: Secondary | ICD-10-CM | POA: Diagnosis not present

## 2021-03-25 DIAGNOSIS — G9341 Metabolic encephalopathy: Secondary | ICD-10-CM | POA: Diagnosis not present

## 2021-03-25 DIAGNOSIS — Z792 Long term (current) use of antibiotics: Secondary | ICD-10-CM | POA: Diagnosis not present

## 2021-03-25 DIAGNOSIS — E1165 Type 2 diabetes mellitus with hyperglycemia: Secondary | ICD-10-CM | POA: Diagnosis not present

## 2021-03-25 DIAGNOSIS — E109 Type 1 diabetes mellitus without complications: Secondary | ICD-10-CM | POA: Diagnosis not present

## 2021-03-25 DIAGNOSIS — R000 Tachycardia, unspecified: Secondary | ICD-10-CM | POA: Diagnosis not present

## 2021-03-25 DIAGNOSIS — Z794 Long term (current) use of insulin: Secondary | ICD-10-CM | POA: Diagnosis not present

## 2021-03-25 DIAGNOSIS — R739 Hyperglycemia, unspecified: Secondary | ICD-10-CM | POA: Diagnosis not present

## 2021-03-25 DIAGNOSIS — Z23 Encounter for immunization: Secondary | ICD-10-CM | POA: Diagnosis not present

## 2021-03-25 DIAGNOSIS — Z20822 Contact with and (suspected) exposure to covid-19: Secondary | ICD-10-CM | POA: Diagnosis not present

## 2021-03-25 DIAGNOSIS — R0902 Hypoxemia: Secondary | ICD-10-CM | POA: Diagnosis not present

## 2021-03-25 DIAGNOSIS — E871 Hypo-osmolality and hyponatremia: Secondary | ICD-10-CM | POA: Diagnosis not present

## 2021-03-25 DIAGNOSIS — A419 Sepsis, unspecified organism: Secondary | ICD-10-CM | POA: Diagnosis not present

## 2021-03-25 DIAGNOSIS — Z79899 Other long term (current) drug therapy: Secondary | ICD-10-CM | POA: Diagnosis not present

## 2021-03-25 DIAGNOSIS — Z7982 Long term (current) use of aspirin: Secondary | ICD-10-CM | POA: Diagnosis not present

## 2021-03-25 DIAGNOSIS — R059 Cough, unspecified: Secondary | ICD-10-CM | POA: Diagnosis not present

## 2021-03-26 DIAGNOSIS — G9341 Metabolic encephalopathy: Secondary | ICD-10-CM | POA: Diagnosis not present

## 2021-03-31 DIAGNOSIS — S72115A Nondisplaced fracture of greater trochanter of left femur, initial encounter for closed fracture: Secondary | ICD-10-CM | POA: Diagnosis not present

## 2021-04-04 ENCOUNTER — Encounter: Payer: Medicare Other | Admitting: Family Medicine

## 2021-04-09 ENCOUNTER — Encounter: Payer: Self-pay | Admitting: Family Medicine

## 2021-04-09 ENCOUNTER — Ambulatory Visit (INDEPENDENT_AMBULATORY_CARE_PROVIDER_SITE_OTHER): Payer: Medicare Other | Admitting: Family Medicine

## 2021-04-09 VITALS — BP 132/68 | HR 85 | Temp 97.3°F | Ht 65.0 in | Wt 250.7 lb

## 2021-04-09 DIAGNOSIS — I10 Essential (primary) hypertension: Secondary | ICD-10-CM | POA: Diagnosis not present

## 2021-04-09 DIAGNOSIS — R109 Unspecified abdominal pain: Secondary | ICD-10-CM | POA: Diagnosis not present

## 2021-04-09 DIAGNOSIS — E109 Type 1 diabetes mellitus without complications: Secondary | ICD-10-CM | POA: Diagnosis not present

## 2021-04-09 DIAGNOSIS — E7800 Pure hypercholesterolemia, unspecified: Secondary | ICD-10-CM | POA: Diagnosis not present

## 2021-04-09 LAB — POC URINALSYSI DIPSTICK (AUTOMATED)
Bilirubin, UA: NEGATIVE
Blood, UA: NEGATIVE
Glucose, UA: POSITIVE — AB
Ketones, UA: NEGATIVE
Leukocytes, UA: NEGATIVE
Nitrite, UA: NEGATIVE
Protein, UA: NEGATIVE
Spec Grav, UA: 1.025 (ref 1.010–1.025)
Urobilinogen, UA: 0.2 E.U./dL
pH, UA: 6 (ref 5.0–8.0)

## 2021-04-09 NOTE — Progress Notes (Addendum)
?Katherine Costa ?DOB: 1950/10/20 ?Encounter date: 04/09/2021 ? ?This is a 71 y.o. female who presents with hospital follow up; ?Chief Complaint  ?Patient presents with  ? Hospitalization Follow-up  ? ? ?History of present illness: ?Patient was seen in urgent care on 03/23/21. Husband had been sick with both flu and covid. Patient had cough and nasal congestion x 3 days at that point. On Tuesday morning husband had to call ambulance for her. She was admitted with sepsis, RLL pneumonia. She states she was on heart floor. Sent home on ceftin 553m BID , zithromax 5049m She was incontinent of stool and urine and delerious at home. Patient states that she was also told she had UTI and treated for this. Multiple bags of antibiotics. She was vomiting green vomit at home prior to going to hospital.discharged 03/25/21. ? ?She has been eating yogurt, cranberry juice, water, tea. Taking probiotic.  ? ?Last two nights she has had right flank pain. Hurts late in evening.  ? ?No fevers since being home.  ? ?Feels like sugars have been better overall. She was surprised by A1C for last 2 weeks 8.2 A1C. Drinking cranberry juice in last week for kidneys/bladder as well as low sugar. She still regularly eats mac and cheese and creamed potatoes.  ? ?No UTI sx.  ? ?Abd feels ok. Normal stools. Bp has been good at home. Was elevated when ambulance came to get her.  ? ? ?Bruising right arm from prior IV. After getting home when she took off bandage she started bleeding from IV site. Arm bubbled up. Put pressure and ice on arm. Whole arm was balck and blue. Still bruising today and this was 2 weeks ago.  ?Allergies  ?Allergen Reactions  ? Pioglitazone   ?  REACTION: Edema  ? ?Current Meds  ?Medication Sig  ? Ascorbic Acid (VITAMIN C) 1000 MG tablet Take 1,000 mg by mouth daily.  ? aspirin 81 MG EC tablet Take 81 mg by mouth daily. Swallow whole.  ? augmented betamethasone dipropionate (DIPROLENE) 0.05 % ointment Apply topically 3 (three)  times daily as needed. rash  ? clotrimazole-betamethasone (LOTRISONE) cream APPLY TOPICALLY THREE TIMES A DAY AS NEEDED FOR RASH.  ? Continuous Blood Gluc Receiver (DEXCOM G6 RECEIVER) DEVI 1 Device by Does not apply route as needed.  ? Continuous Blood Gluc Sensor (DEXCOM G6 SENSOR) MISC 1 Device by Does not apply route as needed.  ? Continuous Blood Gluc Transmit (DEXCOM G6 TRANSMITTER) MISC 1 transmitter every 10 days  ? insulin aspart (NOVOLOG) 100 UNIT/ML injection Inject into the skin 3 (three) times daily before meals. Base of 7 units with breakfast, 9 units with lunch and 11 units with dinner plus the sliding scale.  ? insulin degludec (TRESIBA FLEXTOUCH) 100 UNIT/ML FlexTouch Pen Sample  exp: 03/2021 and lot [id number](Patient taking differently: Inject 65 Units into the skin daily.)  ? lisinopril-hydrochlorothiazide (ZESTORETIC) 10-12.5 MG tablet TAKE 1 TABLET BY MOUTH  DAILY  ? Magnesium 400 MG CAPS Take by mouth daily.  ? methocarbamol (ROBAXIN) 500 MG tablet Take 1 tablet (500 mg total) by mouth every 8 (eight) hours as needed for muscle spasms.  ? omeprazole (PRILOSEC) 40 MG capsule Take 1 capsule (40 mg total) by mouth daily.  ? oxybutynin (DITROPAN-XL) 5 MG 24 hr tablet TAKE ONE TABLET BY MOUTH AT BEDTIME  ? rosuvastatin (CRESTOR) 20 MG tablet TAKE 1 TABLET BY MOUTH  DAILY  ? [DISCONTINUED] insulin lispro (HUMALOG) 100 UNIT/ML injection Inject into the  skin. Inject 5-7 units as needed  ? ? ?Review of Systems  ?Constitutional:  Negative for chills, fatigue and fever.  ?Respiratory:  Negative for cough, chest tightness, shortness of breath and wheezing.   ?Cardiovascular:  Negative for chest pain, palpitations and leg swelling.  ? ?Objective: ? ?BP 132/68 (BP Location: Left Arm, Patient Position: Sitting, Cuff Size: Large)   Pulse 85   Temp (!) 97.3 ?F (36.3 ?C) (Oral)   Ht _0  (1.651 m)   Wt 250 lb 11.2 oz (113.7 kg)   SpO2 100%   BMI 41.72 kg/m?   Weight: 250 lb 11.2 oz (113.7 kg)  ? ?BP  Readings from Last 3 Encounters:  ?04/09/21 132/68  ?09/16/20 124/80  ?06/26/20 132/68  ? ?Wt Readings from Last 3 Encounters:  ?04/09/21 250 lb 11.2 oz (113.7 kg)  ?09/16/20 249 lb 8 oz (113.2 kg)  ?06/26/20 249 lb (112.9 kg)  ? ? ?Physical Exam ?Constitutional:   ?   General: She is not in acute distress. ?   Appearance: She is well-developed.  ?HENT:  ?   Head: Normocephalic and atraumatic.  ?Cardiovascular:  ?   Rate and Rhythm: Normal rate and regular rhythm.  ?   Heart sounds: Normal heart sounds. No murmur heard. ?Pulmonary:  ?   Effort: Pulmonary effort is normal.  ?   Breath sounds: Normal breath sounds.  ?Abdominal:  ?   General: Bowel sounds are normal. There is no distension.  ?   Palpations: Abdomen is soft.  ?   Tenderness: There is no abdominal tenderness. There is right CVA tenderness. There is no guarding.  ?Feet:  ?   Comments: Feet are warm, well perfused. 2+ pedal pulses. ?Skin: ?   General: Skin is warm and dry.  ?   Comments: Sensory exam of the foot is normal, tested with the monofilament. Good pulses, no lesions or ulcers, good peripheral pulses.  ?Psychiatric:     ?   Judgment: Judgment normal.  ? ? ?Assessment/Plan ? ?1. Flank pain ?UA normal; will send for culture. I have requested records from hospital to get prior urine culture/sensitivities for comparison. ?- POCT Urinalysis Dipstick (Automated) ?- Urine Culture; Future ?- Urine Culture ? ?2. Essential hypertension ?Well controlled. Continue with lisinopril-hctz 10-12.5. ?- CBC with Differential/Platelet; Future ?- Comprehensive metabolic panel; Future ?- Comprehensive metabolic panel ?- CBC with Differential/Platelet ? ?3. Type 1 diabetes mellitus without complication (HCC) ?She is eating better, but still most meals are eating out. Still eating higher carb foods like mac and cheese and creamed potatoes regularly. She has cut back on some of excess carbs, but I think that more attention to carb servings/meal would help. She felt that  sugars were improving prior to hospital visit. I have asked her to stop her cranberry juice.  I feel this is only adding to carbohydrates and increasing sugars.  Continue with the Tresiba 65 units daily, continue with insulin NovoLog sliding scale.  I will recheck numbers in a couple weeks to see how her trends are doing at that time.  We discussed adding GLP-1 to help with weight loss and possibly sugar control. ?- Hemoglobin A1c; Future ?- HM DIABETES FOOT EXAM ?- Microalbumin / creatinine urine ratio; Future ?- Microalbumin / creatinine urine ratio ?- Hemoglobin A1c ? ?4. HYPERCHOLESTEROLEMIA ?Continue with Crestor 20 mg daily. ?- Lipid panel; Future ?- Lipid panel ? ? ?45 minutes spent with patient, chart review, exam, charting.  ?Return for pending lab or imaging results. ? ? ? ? ? ? ? ?  Micheline Rough, MD ?

## 2021-04-10 LAB — CBC WITH DIFFERENTIAL/PLATELET
Basophils Absolute: 0.1 10*3/uL (ref 0.0–0.1)
Basophils Relative: 1 % (ref 0.0–3.0)
Eosinophils Absolute: 0.2 10*3/uL (ref 0.0–0.7)
Eosinophils Relative: 2.8 % (ref 0.0–5.0)
HCT: 41.5 % (ref 36.0–46.0)
Hemoglobin: 13.8 g/dL (ref 12.0–15.0)
Lymphocytes Relative: 20.4 % (ref 12.0–46.0)
Lymphs Abs: 1.7 10*3/uL (ref 0.7–4.0)
MCHC: 33.3 g/dL (ref 30.0–36.0)
MCV: 90.7 fl (ref 78.0–100.0)
Monocytes Absolute: 0.6 10*3/uL (ref 0.1–1.0)
Monocytes Relative: 7.1 % (ref 3.0–12.0)
Neutro Abs: 5.7 10*3/uL (ref 1.4–7.7)
Neutrophils Relative %: 68.7 % (ref 43.0–77.0)
Platelets: 349 10*3/uL (ref 150.0–400.0)
RBC: 4.58 Mil/uL (ref 3.87–5.11)
RDW: 14.1 % (ref 11.5–15.5)
WBC: 8.2 10*3/uL (ref 4.0–10.5)

## 2021-04-10 LAB — COMPREHENSIVE METABOLIC PANEL
ALT: 18 U/L (ref 0–35)
AST: 18 U/L (ref 0–37)
Albumin: 3.9 g/dL (ref 3.5–5.2)
Alkaline Phosphatase: 124 U/L — ABNORMAL HIGH (ref 39–117)
BUN: 22 mg/dL (ref 6–23)
CO2: 26 mEq/L (ref 19–32)
Calcium: 9.7 mg/dL (ref 8.4–10.5)
Chloride: 99 mEq/L (ref 96–112)
Creatinine, Ser: 0.9 mg/dL (ref 0.40–1.20)
GFR: 65.64 mL/min (ref >60.00)
Glucose, Bld: 163 mg/dL — ABNORMAL HIGH (ref 70–99)
Potassium: 3.8 mEq/L (ref 3.5–5.1)
Sodium: 135 mEq/L (ref 135–145)
Total Bilirubin: 0.5 mg/dL (ref 0.2–1.2)
Total Protein: 7.2 g/dL (ref 6.0–8.3)

## 2021-04-10 LAB — LIPID PANEL
Cholesterol: 217 mg/dL — ABNORMAL HIGH (ref 0–200)
HDL: 82.9 mg/dL (ref >39.00)
LDL Cholesterol: 112 mg/dL — ABNORMAL HIGH (ref 0–99)
NonHDL: 134.41
Total CHOL/HDL Ratio: 3
Triglycerides: 112 mg/dL (ref 0.0–149.0)
VLDL: 22.4 mg/dL (ref 0.0–40.0)

## 2021-04-10 LAB — MICROALBUMIN / CREATININE URINE RATIO
Creatinine,U: 72.2 mg/dL
Microalb Creat Ratio: 1 mg/g (ref 0.0–30.0)
Microalb, Ur: <0.7 mg/dL (ref 0.0–1.9)

## 2021-04-10 LAB — HEMOGLOBIN A1C: Hgb A1c MFr Bld: 8 % — ABNORMAL HIGH (ref 4.6–6.5)

## 2021-04-11 ENCOUNTER — Other Ambulatory Visit: Payer: Self-pay | Admitting: Family Medicine

## 2021-04-11 MED ORDER — CIPROFLOXACIN HCL 500 MG PO TABS: 500.0000 mg | ORAL_TABLET | Freq: Two times a day (BID) | ORAL | 0 refills | Status: AC

## 2021-04-12 LAB — URINE CULTURE
MICRO NUMBER:: 13134501
SPECIMEN QUALITY:: ADEQUATE

## 2021-04-28 ENCOUNTER — Encounter: Payer: Self-pay | Admitting: Family Medicine

## 2021-04-29 ENCOUNTER — Telehealth: Payer: Self-pay | Admitting: Pharmacist

## 2021-04-29 NOTE — Telephone Encounter (Signed)
Called patient to discuss results of Dexcom report for the past 2 weeks. Per discussion with PCP, recommended food diary for 1 week to see how this affects blood sugars. Recommended injecting Novolog 15 minutes prior to eating to avoid playing catch up with lows. ?

## 2021-05-09 NOTE — Telephone Encounter (Signed)
Called patient to follow up on food diary results. Patient was out of town and left her food diary at home. Plan to follow up with her in 2 weeks on it.  ?

## 2021-05-18 DIAGNOSIS — R109 Unspecified abdominal pain: Secondary | ICD-10-CM | POA: Diagnosis not present

## 2021-05-18 DIAGNOSIS — K591 Functional diarrhea: Secondary | ICD-10-CM | POA: Diagnosis not present

## 2021-05-19 DIAGNOSIS — R109 Unspecified abdominal pain: Secondary | ICD-10-CM | POA: Diagnosis not present

## 2021-05-19 DIAGNOSIS — R197 Diarrhea, unspecified: Secondary | ICD-10-CM | POA: Diagnosis not present

## 2021-05-21 DIAGNOSIS — M25562 Pain in left knee: Secondary | ICD-10-CM | POA: Diagnosis not present

## 2021-05-21 DIAGNOSIS — S72115D Nondisplaced fracture of greater trochanter of left femur, subsequent encounter for closed fracture with routine healing: Secondary | ICD-10-CM | POA: Diagnosis not present

## 2021-05-23 ENCOUNTER — Encounter: Payer: Self-pay | Admitting: Family Medicine

## 2021-05-23 ENCOUNTER — Ambulatory Visit (INDEPENDENT_AMBULATORY_CARE_PROVIDER_SITE_OTHER): Payer: Medicare Other | Admitting: Family Medicine

## 2021-05-23 VITALS — BP 122/62 | HR 73 | Temp 98.3°F | Ht 63.5 in | Wt 251.4 lb

## 2021-05-23 DIAGNOSIS — E7800 Pure hypercholesterolemia, unspecified: Secondary | ICD-10-CM

## 2021-05-23 DIAGNOSIS — E109 Type 1 diabetes mellitus without complications: Secondary | ICD-10-CM | POA: Diagnosis not present

## 2021-05-23 DIAGNOSIS — Z0000 Encounter for general adult medical examination without abnormal findings: Secondary | ICD-10-CM | POA: Diagnosis not present

## 2021-05-23 DIAGNOSIS — K219 Gastro-esophageal reflux disease without esophagitis: Secondary | ICD-10-CM

## 2021-05-23 DIAGNOSIS — I10 Essential (primary) hypertension: Secondary | ICD-10-CM | POA: Diagnosis not present

## 2021-05-23 MED ORDER — SHINGRIX 50 MCG/0.5ML IM SUSR: 0.5000 mL | Freq: Once | INTRAMUSCULAR | 0 refills | Status: AC

## 2021-05-23 NOTE — Progress Notes (Signed)
Katherine Costa ?DOB: 24-Sep-1950 ?Encounter date: 05/23/2021 ? ?This is a 71 y.o. female who presents for complete physical  ? ?History of present illness/Additional concerns: ?Had discussion with pharmacist on 4/4 to discuss results of Dexcom report.  We did encourage patient to keep food diary for weeks so that we can follow-up on how this is affecting blood sugars.  We felt that sugar elevations may be secondary to food selection.  Also recommended making sure to inject short acting insulin prior to eating for better postmeal sugar control. ? ?Last visit with me was 3/15.  At that time she was recovering hospitalization/sepsis/pneumonia/UTI. ? ?She was sick last week- had diarrhea and didn't eat for 2-3 days. She was worried about UTI because she couldn't control stools. By Sunday she went to urgent care and was tested for UTI - told she didn't have one but they just called her on her way here and told her they were sending in [place]. She also gave stool sample but stools are now solid. She still hasn't been eating a lot this week. She is feeling better now. Currently she is really feeling better. She did have some right sided flank pain last week.  ? ?Sugars have been crazy with her being sick - high and bottoming out; just all over the place.  ? ?Released with hip last weds; healing well. She has to reschedule mammogram. She hasn't scheduled colonoscopy yet, but knows she needs to.  ? ? ?Past Medical History:  ?Diagnosis Date  ? Arthritis   ? ASYMPTOMATIC POSTMENOPAUSAL STATUS 05/21/2009  ? DIABETES MELLITUS, TYPE I 08/24/2006  ? Family history of colon cancer   ? HERPES LABIALIS 03/13/2009  ? HYPERCHOLESTEROLEMIA 02/09/2008  ? Hyperlipidemia   ? HYPERTENSION 08/24/2006  ? Hypertension   ? LIVER DISORDER 03/17/2010  ? Tubulovillous adenoma of colon   ? 2010   ? ?Past Surgical History:  ?Procedure Laterality Date  ? ABDOMINAL HYSTERECTOMY    ? endometriosis; menorrhagia  ? ACHILLES TENDON REPAIR  2010  ? right heel  ?  CHOLECYSTECTOMY  2001  ? TOTAL KNEE ARTHROPLASTY Left 09/13/2015  ? Procedure: TOTAL KNEE ARTHROPLASTY;  Surgeon: Earlie Server, MD;  Location: [place];  Service: Orthopedics;  Laterality: Left;  ? ?Allergies  ?Allergen Reactions  ? Pioglitazone   ?  REACTION: Edema  ? ?Current Meds  ?Medication Sig  ? Ascorbic Acid (VITAMIN C) 1000 MG tablet Take 1,000 mg by mouth daily.  ? aspirin 81 MG EC tablet Take 81 mg by mouth daily. Swallow whole.  ? augmented betamethasone dipropionate (DIPROLENE) 0.05 % ointment Apply topically 3 (three) times daily as needed. rash  ? clotrimazole-betamethasone (LOTRISONE) cream APPLY TOPICALLY THREE TIMES A DAY AS NEEDED FOR RASH.  ? Continuous Blood Gluc Receiver (DEXCOM G6 RECEIVER) DEVI 1 Device by Does not apply route as needed.  ? Continuous Blood Gluc Sensor (DEXCOM G6 SENSOR) MISC 1 Device by Does not apply route as needed.  ? Continuous Blood Gluc Transmit (DEXCOM G6 TRANSMITTER) MISC 1 transmitter every 10 days  ? insulin aspart (NOVOLOG) 100 UNIT/ML injection Inject into the skin 3 (three) times daily before meals. Base of 7 units with breakfast, 9 units with lunch and 11 units with dinner plus the sliding scale.  ? insulin degludec (TRESIBA FLEXTOUCH) 100 UNIT/ML FlexTouch Pen Sample  exp: 03/2021 and lot [id number]. (Patient taking differently: Inject 65 Units into the skin daily.)  ? lisinopril-hydrochlorothiazide (ZESTORETIC) 10-12.5 MG tablet TAKE 1 TABLET BY MOUTH  DAILY  ? Magnesium 400 MG CAPS Take by mouth daily.  ? methocarbamol (ROBAXIN) 500 MG tablet Take 1 tablet (500 mg total) by mouth every 8 (eight) hours as needed for muscle spasms.  ? omeprazole (PRILOSEC) 40 MG capsule Take 1 capsule (40 mg total) by mouth daily.  ? oxybutynin (DITROPAN-XL) 5 MG 24 hr tablet TAKE ONE TABLET BY MOUTH AT BEDTIME  ? rosuvastatin (CRESTOR) 20 MG tablet TAKE 1 TABLET BY MOUTH  DAILY  ? [EXPIRED] Zoster Vaccine Adjuvanted [facility]) injection Inject 0.5 mLs into the muscle once for 1  dose. Repeat in 2-6 months  ? ?Social History  ? ?Tobacco Use  ? Smoking status: Never  ? Smokeless tobacco: Never  ?Substance Use Topics  ? Alcohol use: No  ?  Alcohol/week: 0.0 standard drinks  ? ?Family History  ?Problem Relation Age of Onset  ? Cancer Mother   ?     Colon Cancer, Pancreatic Cancer  ? Colon cancer Mother   ? Diabetes Sister   ? Colon cancer Sister   ? Cancer Sister   ?     Breast Cancer  ? Heart attack Father 49  ? Diabetes Brother   ? Heart disease Brother   ?     related to agent orange  ? Prostate cancer Maternal Grandfather   ? Heart disease Paternal Grandmother   ? Diabetes Brother   ? Diabetes Daughter   ? Kidney failure Daughter   ? Heart disease Daughter 30  ? ? ? ?Review of Systems  ?Constitutional:  Negative for activity change, appetite change, chills, fatigue, fever and unexpected weight change.  ?HENT:  Negative for congestion, ear pain, hearing loss, sinus pressure, sinus pain, sore throat and trouble swallowing.   ?Eyes:  Negative for pain and visual disturbance.  ?Respiratory:  Negative for cough, chest tightness, shortness of breath and wheezing.   ?Cardiovascular:  Negative for chest pain, palpitations and leg swelling.  ?Gastrointestinal:  Negative for abdominal pain, blood in stool, constipation, diarrhea, nausea and vomiting.  ?Genitourinary:  Negative for difficulty urinating and menstrual problem.  ?Musculoskeletal:  Positive for arthralgias. Negative for back pain.  ?Skin:  Negative for rash.  ?Neurological:  Negative for dizziness, weakness, numbness and headaches.  ?Hematological:  Negative for adenopathy. Does not bruise/bleed easily.  ?Psychiatric/Behavioral:  Negative for sleep disturbance and suicidal ideas. The patient is not nervous/anxious.   ? ?CBC:  ?Lab Results  ?Component Value Date  ? WBC 8.2 04/09/2021  ? HGB 13.8 04/09/2021  ? HCT 41.5 04/09/2021  ? MCH 29.2 04/11/2020  ? MCHC 33.3 04/09/2021  ? RDW 14.1 04/09/2021  ? PLT 349.0 04/09/2021  ? MPV 10.3  04/11/2020  ? ?CMP: ?Lab Results  ?Component Value Date  ? NA 135 04/09/2021  ? K 3.8 04/09/2021  ? CL 99 04/09/2021  ? CO2 26 04/09/2021  ? ANIONGAP 9 09/14/2015  ? GLUCOSE 163 (H) 04/09/2021  ? GLUCOSE 235 (H) 02/09/2006  ? BUN 22 04/09/2021  ? CREATININE 0.90 04/09/2021  ? CREATININE 0.79 09/28/2019  ? GFRAA >60 09/14/2015  ? CALCIUM 9.7 04/09/2021  ? PROT 7.2 04/09/2021  ? BILITOT 0.5 04/09/2021  ? ALKPHOS 124 (H) 04/09/2021  ? ALT 18 04/09/2021  ? AST 18 04/09/2021  ? ?LIPID: ?Lab Results  ?Component Value Date  ? CHOL 217 (H) 04/09/2021  ? TRIG 112.0 04/09/2021  ? TRIG 61 02/09/2006  ? HDL 82.90 04/09/2021  ? LDLCALC 112 (H) 04/09/2021  ? LDLCALC 164 (H) 09/28/2019  ? ? ?  Objective: ? ?BP 122/62 (BP Location: Left Arm, Patient Position: Sitting, Cuff Size: Large)   Pulse 73   Temp 98.3 ?F (36.8 ?C) (Oral)   Ht 5' 3.5" (1.613 m)   Wt 251 lb 6.4 oz (114 kg)   SpO2 99%   BMI 43.83 kg/m?   Weight: 251 lb 6.4 oz (114 kg)  ? ?BP Readings from Last 3 Encounters:  ?05/23/21 122/62  ?04/09/21 132/68  ?09/16/20 124/80  ? ?Wt Readings from Last 3 Encounters:  ?05/23/21 251 lb 6.4 oz (114 kg)  ?04/09/21 250 lb 11.2 oz (113.7 kg)  ?09/16/20 249 lb 8 oz (113.2 kg)  ? ? ?Physical Exam ?Constitutional:   ?   General: She is not in acute distress. ?   Appearance: She is well-developed. She is obese.  ?HENT:  ?   Head: Normocephalic and atraumatic.  ?   Right Ear: External ear normal.  ?   Left Ear: External ear normal.  ?   Mouth/Throat:  ?   Pharynx: No oropharyngeal exudate.  ?Eyes:  ?   Conjunctiva/sclera: Conjunctivae normal.  ?   Pupils: Pupils are equal, round, and reactive to light.  ?Neck:  ?   Thyroid: No thyromegaly.  ?Cardiovascular:  ?   Rate and Rhythm: Normal rate and regular rhythm.  ?   Heart sounds: Normal heart sounds. No murmur heard. ?  No friction rub. No gallop.  ?Pulmonary:  ?   Effort: Pulmonary effort is normal.  ?   Breath sounds: Normal breath sounds.  ?Abdominal:  ?   General: Bowel sounds are  normal. There is no distension.  ?   Palpations: Abdomen is soft. There is no mass.  ?   Tenderness: There is no abdominal tenderness. There is no guarding.  ?   Hernia: No hernia is present.  ?Musculoskeletal:

## 2021-05-23 NOTE — Patient Instructions (Signed)
Send in food diary to [place] in 1-2 weeks.  ?

## 2021-05-27 DIAGNOSIS — E109 Type 1 diabetes mellitus without complications: Secondary | ICD-10-CM | POA: Diagnosis not present

## 2021-05-27 DIAGNOSIS — L820 Inflamed seborrheic keratosis: Secondary | ICD-10-CM | POA: Diagnosis not present

## 2021-05-27 DIAGNOSIS — Z794 Long term (current) use of insulin: Secondary | ICD-10-CM | POA: Diagnosis not present

## 2021-05-27 DIAGNOSIS — D224 Melanocytic nevi of scalp and neck: Secondary | ICD-10-CM | POA: Diagnosis not present

## 2021-06-04 DIAGNOSIS — N6081 Other benign mammary dysplasias of right breast: Secondary | ICD-10-CM | POA: Diagnosis not present

## 2021-06-04 LAB — HM MAMMOGRAPHY

## 2021-06-05 ENCOUNTER — Encounter: Payer: Self-pay | Admitting: Family Medicine

## 2021-06-27 DIAGNOSIS — Z794 Long term (current) use of insulin: Secondary | ICD-10-CM | POA: Diagnosis not present

## 2021-06-27 DIAGNOSIS — E109 Type 1 diabetes mellitus without complications: Secondary | ICD-10-CM | POA: Diagnosis not present

## 2021-06-29 ENCOUNTER — Other Ambulatory Visit: Payer: Self-pay | Admitting: Family Medicine

## 2021-06-30 ENCOUNTER — Other Ambulatory Visit: Payer: Self-pay | Admitting: Family Medicine

## 2021-07-28 DIAGNOSIS — Z794 Long term (current) use of insulin: Secondary | ICD-10-CM | POA: Diagnosis not present

## 2021-07-28 DIAGNOSIS — E109 Type 1 diabetes mellitus without complications: Secondary | ICD-10-CM | POA: Diagnosis not present

## 2021-08-04 ENCOUNTER — Other Ambulatory Visit: Payer: Self-pay | Admitting: *Deleted

## 2021-08-04 MED ORDER — OMEPRAZOLE 40 MG PO CPDR: 40.0000 mg | DELAYED_RELEASE_CAPSULE | Freq: Every day | ORAL | 1 refills | Status: DC

## 2021-08-08 ENCOUNTER — Telehealth: Payer: Self-pay | Admitting: Pharmacist

## 2021-08-08 NOTE — Chronic Care Management (AMB) (Signed)
    Chronic Care Management Pharmacy Assistant   Name: Katherine Costa  MRN: [id number] DOB: 1950-05-10  08/11/2021 APPOINTMENT REMINDER  Called [place], No answer, left message of appointment on 08/11/2021 at 4:00 via telephone visit with [name], Pharm D. Notified to have all medications, supplements, blood pressure and/or blood sugar logs available during appointment and to return call if need to reschedule.  Care Gaps: AWV - message sent to [name] Last  BP - 122/62 on 05/23/2021 Last A1C - 8.0 on 04/09/2021 Covid booster - overdue Pneumonia vaccine - overdue Eye exam - postponed Colonoscopy - postponed Shingrix - postponed  Star Rating Drug: Lisinopril-HCTZ10-12.'5mg'$  - last filled 08/06/2024 90 DS at [facility] Rosuvastatin '20mg'$  - last filled 07/23/2021 90 DS at [facility] date verified with [name]  Any gaps in medications fill history? NO  [name] [facility]  Catering manager [phone]

## 2021-08-11 ENCOUNTER — Telehealth: Payer: Medicare Other

## 2021-08-13 ENCOUNTER — Ambulatory Visit: Payer: Medicare Other | Admitting: Pharmacist

## 2021-08-13 DIAGNOSIS — E7800 Pure hypercholesterolemia, unspecified: Secondary | ICD-10-CM

## 2021-08-13 DIAGNOSIS — E109 Type 1 diabetes mellitus without complications: Secondary | ICD-10-CM

## 2021-08-13 NOTE — Progress Notes (Signed)
Chronic Care Management Pharmacy Note  08/15/2021 Name:  Katherine Costa MRN:  [id number] DOB:  [birth year]  Summary: Pt is overcorrecting lows per Dexcom report Pt has had a few lows within the last 2 weeks  Recommendations/Changes made from today's visit: -Recommended avoiding overcorrecting with multiple carbs and choosing a simple carb followed by protein -Recommended switching to [place] once out of Novolog insulin  Plan: Follow up on Dexcom report in 2 weeks   Subjective: JAHLIA Costa is an 71 y.o. year old female who is a primary patient of Koberlein, Steele Berg, MD (Inactive).  The CCM team was consulted for assistance with disease management and care coordination needs.    Engaged with patient by telephone for follow up visit in response to provider referral for pharmacy case management and/or care coordination services.   Consent to Services:  The patient was given information about Chronic Care Management services, agreed to services, and gave verbal consent prior to initiation of services.  Please see initial visit note for detailed documentation.   Patient Care Team: [name], MD (Inactive) as PCP - General (Family Medicine) Maisie Fus, MD as Attending Physician (Obstetrics and Gynecology) Lorelle Gibbs, MD (Radiology) Viona Gilmore, [facility] as Pharmacist (Pharmacist)  Recent office visits: 05/23/21 Micheline Rough, MD: Patient presented for annual exam. No medication changes.  04/09/21 Micheline Rough, MD: Patient presented for hospital follow up. Increased Crestor to 40 mg daily.  Recent consult visits: 05/01/21 Marchia Bond (ortho): Patient presented for follow up post left femur fracture. Unable to access notes.  03/03/21 Marchia Bond (ortho): Patient presented for pain in left knee. Unable to access notes.  Hospital visits: 2/28-03/26/21 Patient presented to [facility] ED due to sepsis. Unable to access notes from  hospitalization.  03/23/21 Patient presented to [facility] for nasal congestion and cough.  Objective:  Lab Results  Component Value Date   CREATININE 0.90 04/09/2021   BUN 22 04/09/2021   GFR 65.64 04/09/2021   GFRNONAA >60 09/14/2015   GFRAA >60 09/14/2015   NA 135 04/09/2021   K 3.8 04/09/2021   CALCIUM 9.7 04/09/2021   CO2 26 04/09/2021   GLUCOSE 163 (H) 04/09/2021    Lab Results  Component Value Date/Time   HGBA1C 8.0 (H) 04/09/2021 03:33 PM   HGBA1C 8.1 (A) 09/16/2020 08:45 AM   HGBA1C 9.2 (H) 06/19/2020 07:17 AM   FRUCTOSAMINE 337 (H) 09/29/2017 10:05 AM   FRUCTOSAMINE 339 (H) 06/29/2017 10:57 AM   GFR 65.64 04/09/2021 03:33 PM   GFR 74.78 09/16/2020 09:45 AM   MICROALBUR <0.7 04/09/2021 03:33 PM   MICROALBUR <0.7 03/18/2020 09:49 AM    Last diabetic Eye exam: No results found for: "HMDIABEYEEXA"  Last diabetic Foot exam: No results found for: "HMDIABFOOTEX"   Lab Results  Component Value Date   CHOL 217 (H) 04/09/2021   HDL 82.90 04/09/2021   LDLCALC 112 (H) 04/09/2021   LDLDIRECT 110.6 09/02/2011   TRIG 112.0 04/09/2021   CHOLHDL 3 04/09/2021       Latest Ref Rng & Units 04/09/2021    3:33 PM 09/16/2020    9:45 AM 06/19/2020    7:17 AM  Hepatic Function  Total Protein 6.0 - 8.3 g/dL 7.2  6.3  6.1   Albumin 3.5 - 5.2 g/dL 3.9  3.6  3.7   AST 0 - 37 U/L _0 ALT 0 - 35 U/L 18  17  16  Alk Phosphatase 39 - 117 U/L 124  112  101   Total Bilirubin 0.2 - 1.2 mg/dL 0.5  0.6  0.6     Lab Results  Component Value Date/Time   TSH 1.90 09/28/2019 07:21 AM   TSH 1.31 06/29/2018 08:01 AM       Latest Ref Rng & Units 04/09/2021    3:33 PM 09/16/2020    9:45 AM 04/11/2020   10:00 AM  CBC  WBC 4.0 - 10.5 K/uL 8.2  6.3  5.8   Hemoglobin 12.0 - 15.0 g/dL 13.8  14.0  14.2   Hematocrit 36.0 - 46.0 % 41.5  41.6  43.7   Platelets 150.0 - 400.0 K/uL 349.0  259.0  282     No results found for: "VD25OH"  Clinical ASCVD: No   The 10-year ASCVD risk score (Arnett DK, et al., 2019) is: 15.9%   Values used to calculate the score:     Age: 71 years     Sex: Female     Is Non-Hispanic African American: No     Diabetic: Yes     Tobacco smoker: No     Systolic Blood Pressure: 196 mmHg     Is BP treated: Yes     HDL Cholesterol: 82.9 mg/dL     Total Cholesterol: 217 mg/dL       05/23/2021   10:10 AM 04/09/2021    3:44 PM 03/18/2020    9:02 AM  Depression screen PHQ 2/9  Decreased Interest 0 0 0  Down, Depressed, Hopeless 0 0 0  PHQ - 2 Score 0 0 0  Altered sleeping 0 0   Tired, decreased energy 0 0   Change in appetite 0 0   Feeling bad or failure about yourself  0 0   Trouble concentrating 0 0   Moving slowly or fidgety/restless 0 0   Suicidal thoughts 0 0   PHQ-9 Score 0 0   Difficult doing work/chores Not difficult at all        Social History   Tobacco Use  Smoking Status Never  Smokeless Tobacco Never   BP Readings from Last 3 Encounters:  05/23/21 122/62  04/09/21 132/68  09/16/20 124/80   Pulse Readings from Last 3 Encounters:  05/23/21 73  04/09/21 85  09/16/20 71   Wt Readings from Last 3 Encounters:  05/23/21 251 lb 6.4 oz (114 kg)  04/09/21 250 lb 11.2 oz (113.7 kg)  09/16/20 249 lb 8 oz (113.2 kg)   BMI Readings from Last 3 Encounters:  05/23/21 43.83 kg/m  04/09/21 41.72 kg/m  09/16/20 41.52 kg/m    Assessment/Interventions: Review of patient past medical history, allergies, medications, health status, including review of consultants reports, laboratory and other test data, was performed as part of comprehensive evaluation and provision of chronic care management services.   SDOH:  (Social Determinants of Health) assessments and interventions performed: Yes   SDOH Screenings   Alcohol Screen: Not on file  Depression (PHQ2-9): Low Risk  (05/23/2021)   Depression (PHQ2-9)    PHQ-2 Score: 0  Financial Resource Strain: Medium Risk (06/07/2020)   Overall Financial  Resource Strain (CARDIA)    Difficulty of Paying Living Expenses: Somewhat hard  Food Insecurity: Not on file  Housing: Not on file  Physical Activity: Not on file  Social Connections: Not on file  Stress: Not on file  Tobacco Use: Low Risk  (05/23/2021)   Patient History    Smoking Tobacco Use: Never  Smokeless Tobacco Use: Never    Passive Exposure: Not on file  Transportation Needs: No Transportation Needs (06/07/2020)   PRAPARE - Hydrologist (Medical): No    Lack of Transportation (Non-Medical): No   CCM Care Plan  Allergies  Allergen Reactions   Pioglitazone     REACTION: Edema    Medications Reviewed Today     Reviewed by Agnes Lawrence, CMA (Certified Medical Assistant) on 05/23/21 at [place] List Status: <None>   Medication Order Taking? Sig Documenting Provider Last Dose Status Informant  Ascorbic Acid (VITAMIN C) 1000 MG tablet [id number] Yes Take 1,000 mg by mouth daily. [provider] Taking Active   aspirin 81 MG EC tablet [id number] Yes Take 81 mg by mouth daily. Swallow whole. [provider] Taking Active   augmented betamethasone dipropionate (DIPROLENE) 0.05 % ointment [id number] Yes Apply topically 3 (three) times daily as needed. rash Renato Shin, MD Taking Active   clotrimazole-betamethasone Donalynn Furlong) cream [id number] Yes APPLY TOPICALLY THREE TIMES A DAY AS NEEDED FOR RASH. [name], MD Taking Active   Continuous Blood Gluc Receiver ([place]) DEVI [id number] Yes 1 Device by Does not apply route as needed. [name], MD Taking Active   Continuous Blood Gluc Sensor (DEXCOM G6 SENSOR) MISC [id number] Yes 1 Device by Does not apply route as needed. [name], MD Taking Active   Continuous Blood Gluc Transmit (DEXCOM G6 TRANSMITTER) MISC [id number] Yes 1 transmitter every 10 days Koberlein, Steele Berg, MD Taking Active   insulin aspart (NOVOLOG) 100 UNIT/ML injection  [id number] Yes Inject into the skin 3 (three) times daily before meals. Base of 7 units with breakfast, 9 units with lunch and 11 units with dinner plus the sliding scale. [provider] Taking Active   insulin degludec [facility] FLEXTOUCH) 100 UNIT/ML FlexTouch Pen [id number] Yes Sample  exp: 03/2021 and lot [id number].  Patient taking differently: Inject 65 Units into the skin daily.   [name], MD Taking Active   lisinopril-hydrochlorothiazide (ZESTORETIC) 10-12.5 MG tablet [id number] Yes TAKE 1 TABLET BY MOUTH  DAILY Koberlein, Junell C, MD Taking Active   Magnesium 400 MG CAPS [id number] Yes Take by mouth daily. [provider] Taking Active   methocarbamol (ROBAXIN) 500 MG tablet [id number] Yes Take 1 tablet (500 mg total) by mouth every 8 (eight) hours as needed for muscle spasms. Jessy Oto, MD Taking Active   omeprazole (PRILOSEC) 40 MG capsule [id number] Yes Take 1 capsule (40 mg total) by mouth daily. [name], MD Taking Active   oxybutynin (DITROPAN-XL) 5 MG 24 hr tablet [id number] Yes TAKE ONE TABLET BY MOUTH AT BEDTIME Koberlein, Junell C, MD Taking Active   rosuvastatin (CRESTOR) 20 MG tablet [id number] Yes TAKE 1 TABLET BY MOUTH  DAILY [name], MD Taking Active             Patient Active Problem List   Diagnosis Date Noted   Urinary incontinence 06/24/2018   Gastroesophageal reflux disease 02/11/2018   Primary localized osteoarthritis of left knee 09/13/2015   Pain in joint, shoulder region 06/01/2014   Morbid obesity ([place]) 04/21/2013   Encounter for long-term (current) use of other medications 09/02/2011   LIVER DISORDER 03/17/2010   ASYMPTOMATIC POSTMENOPAUSAL STATUS 05/21/2009   HERPES LABIALIS 03/13/2009   HYPERCHOLESTEROLEMIA 02/09/2008   Type 1 diabetes mellitus ([place]) 08/24/2006   Essential hypertension 08/24/2006    Immunization History  Administered Date(s) Administered  Fluad Quad(high Dose 65+) 10/14/2018,  03/18/2020   Influenza Whole 10/26/2008, 12/06/2009, 10/02/2010   Influenza, High Dose Seasonal PF 10/29/2017   Influenza,inj,Quad PF,6+ Mos 10/31/2013   Influenza-Unspecified 11/16/2015, 11/17/2016   PFIZER(Purple Top)SARS-COV-2 Vaccination 02/26/2019, 03/19/2019, 01/04/2020   Pneumococcal Conjugate-13 09/13/2019   Pneumococcal Polysaccharide-23 05/21/2009   Tdap 06/29/2017   Zoster, Live 12/11/2014   Patient has been really active with remodeling and has noticed more lows. She was eating something prior to bedtime to avoid the lows. Patient will aim for half glass of juice and spoonful of PB and this didn't go as high. Patient admits she thinks she was overcorrecting for lows with a lot of carbs and then it went high overnight as a result.  Dexcom CGM report below:         Conditions to be addressed/monitored:  Hypertension, Hyperlipidemia, Diabetes, GERD, Overactive Bladder and Pain  Conditions addressed this visit: Diabetes, hypertension   Care Plan : [place]  Updates made by Viona Gilmore, [place] since 08/15/2021 12:00 AM     Problem: Problem: Hypertension, Hyperlipidemia, Diabetes, GERD, Overactive Bladder and Pain      Long-Range Goal: Patient-Specific Goal   Start Date: 05/31/2020  Expected End Date: 05/31/2021  Recent Progress: On track  Priority: High  Note:   Current Barriers:  Unable to independently afford treatment regimen Unable to independently monitor therapeutic efficacy Unable to achieve control of diabetes   Pharmacist Clinical Goal(s):  Patient will verbalize ability to afford treatment regimen achieve adherence to monitoring guidelines and medication adherence to achieve therapeutic efficacy achieve control of diabetes as evidenced by A1c  through collaboration with PharmD and provider.   Interventions: 1:1 collaboration with [name], MD regarding development and update of comprehensive plan of care as evidenced by  provider attestation and co-signature Inter-disciplinary care team collaboration (see longitudinal plan of care) Comprehensive medication review performed; medication list updated in electronic medical record  Hypertension (BP goal <130/80) -Controlled -Current treatment: Lisinopril - HCTZ 10-12.5 mg 1 tablet daily - Appropriate, Effective, Safe, Accessible -Medications previously tried: none  -Current home readings: does not check -Current dietary habits: does add salt to food -Current exercise habits: yardwork but no formal exercsie -Denies hypotensive/hypertensive symptoms -Educated on Daily salt intake goal < 2300 mg; Exercise goal of 150 minutes per week; Importance of home blood pressure monitoring; Proper BP monitoring technique; -Counseled to monitor BP at home weekly, document, and provide log at future appointments -Counseled on diet and exercise extensively Recommended to continue current medication Recommended lower sodium salt substitute  Hyperlipidemia: (LDL goal < 70) -Not ideally controlled -Current treatment: Rosuvastatin 40 mg 1 tablet daily - Appropriate, Query effective, Safe, Accessible -Medications previously tried: none  -Current dietary patterns: eating take out fast food which includes fried foods; uses canola oil when cooking -Current exercise habits: yardwork but no formal exercise -Educated on Cholesterol goals;  Benefits of statin for ASCVD risk reduction; Importance of limiting foods high in cholesterol; Exercise goal of 150 minutes per week; -Counseled on diet and exercise extensively Recommended to continue current medication  Diabetes (A1c goal <7%) -Uncontrolled -Current medications: Novolog 100 unit/mL inject 7 units plus sliding scale with breakfast, 9 base units with lunch and 11 with dinner- Appropriate, Query effective, Safe, Accessible Tresiba 65 units daily - Appropriate, Query effective, Safe, Accessible -Medications previously tried:  pioglitazone (edema) -Current home glucose readings: downloaded CGM data fasting glucose: some overnight high blood sugars post prandial glucose: after meals high  blood sugars -Denies hypoglycemic/hyperglycemic symptoms -Current meal patterns:  breakfast: 2 eggs, 2 strips of bacon, small bowl of grits or hash browns & gravy lunch: chicken nuggets or hot dog or cheeseburger or fish sandwich or nothing dinner: biggest meal: meat, vegetable, creamed potatoes or mac n'cheese snacks: some late night snacking drinks: diet drinks -Current exercise: busy with death in the family -Educated on A1c and blood sugar goals; Exercise goal of 150 minutes per week; Continuous glucose monitoring; Carbohydrate counting and/or plate method -Counseled to check feet daily and get yearly eye exams -Counseled on diet and exercise extensively Recommended using sliding scale with the decreasing to a base of 7 units with breakfast and 9 with lunch and 11 with dinner. Recommended avoiding overcorrecting for avoidance of lows.  GERD (Goal: minimize symptoms of heartburn) -Controlled -Current treatment  Omeprazole 40 mg 1 capsule daily - Appropriate, Effective, Query Safe, Accessible -Medications previously tried: none  -Counseled on non-pharmacologic management of symptoms such as elevating the head of your bed, avoiding eating 2-3 hours before bed, avoiding triggering foods such as acidic, spicy, or fatty foods, eating smaller meals, and wearing clothes that are loose around the waist  Overactive bladder (Goal: minimize symptoms) -Controlled -Current treatment  Oxybutynin XL 5 mg 1 tablet daily at bedtime as needed (taking in the morning sometimes) - Appropriate, Effective, Query Safe, Accessible -Medications previously tried: none  - Patient only takes as needed  Pain (Goal: minimize symptoms) -Controlled -Current treatment  Diclofenac 50 mg 1 tablet three times daily - was taking twice a day -  Appropriate, Effective, Safe, Accessible Gabapentin 100 mg 1 capsule at bedtime as needed  - Appropriate, Effective, Safe, Accessible Methocarbamol 500 mg 1 tablet every 8 hours as needed - takes it in AM or sometimes in PM - Appropriate, Effective, Safe, Accessible -Medications previously tried: none  -Recommended to continue current medication   Health Maintenance -Vaccine gaps: shingrix, COVID booster -Current therapy:  Vitamin C 1000 mg 1 tablet daily Aspirin 81 mg 1 tablet daily  Betamethasone 0.05% ointment - poison oak Clotrimazole-betamethasone - yeast, during the summer Magnesium 400 mg 1 capsule daily -Educated on Cost vs benefit of each product must be carefully weighed by individual consumer -Patient is satisfied with current therapy and denies issues -Recommended to continue current medication  Patient Goals/Self-Care Activities Patient will:  - take medications as prescribed check glucose with Dexcom, document, and provide at future appointments check blood pressure weekly, document, and provide at future appointments target a minimum of 150 minutes of moderate intensity exercise weekly  Follow Up Plan: Telephone follow up appointment with care management team member scheduled for:2 weeks      Medication Assistance:  Tyler Aas and Novolog obtained through [employer] medication assistance program.  Enrollment ends 01/25/22  Compliance/Adherence/Medication fill history: Care Gaps: Shingrix, COVID booster, foot exam, colonoscopy, eye exam, Prevnar20 Last PCP BP: 122/62 on 05/23/2021 Last A1C - 8.0 on 04/09/2021  Star-Rating Drugs: Lisinopril-HCTZ10-12.24m - last filled 08/06/2024 90 DS at [facility] Rosuvastatin 236m- last [facility]2021 [facility] verified with AmEstill BambergPatient's preferred pharmacy is:  [facility] ([place]hone:  [phone]: [phone] [place] [address] [place]hone: [phone]: [phone] (  [facility] ) - [place], [address] [place] [address] Phone: [phone] Fax: [phone]   Uses pill box? Yes - once a week Pt endorses 100% compliance   We discussed: Current pharmacy is preferred with insurance plan and patient is satisfied with pharmacy services Patient decided to: Continue current medication management strategy  Care Plan and Follow Up Patient Decision:  Patient agrees to Care Plan and Follow-up.  Plan: The care management team will reach out to the patient again over the next 30 days.  [name], PharmD [facility] Clinical Pharmacist [place] [facility] [place]

## 2021-08-15 ENCOUNTER — Telehealth: Payer: Self-pay | Admitting: *Deleted

## 2021-08-15 DIAGNOSIS — Z794 Long term (current) use of insulin: Secondary | ICD-10-CM | POA: Diagnosis not present

## 2021-08-15 DIAGNOSIS — E109 Type 1 diabetes mellitus without complications: Secondary | ICD-10-CM | POA: Diagnosis not present

## 2021-08-15 NOTE — Telephone Encounter (Signed)
[facility] [phone]) faxed a request for the past 6 months of medical records to be faxed to their company for an insulin pump, CGM or supplies.  I spoke with Dewitt Hoes and informed her our office does not send medical records to medical supply companies without a signed release from the patient.

## 2021-08-15 NOTE — Patient Instructions (Signed)
Hi Katherine Costa,  It was great to catch up with you again! Please try to work on avoiding overcorrecting for your lows. I think this is what is causing the highs overnight.  Please reach out to me if you have any questions or need anything before our follow up!  Best, Maddie  [name], PharmD, [place] Pharmacist [facility]   Visit Information   Goals Addressed   None    Patient Care Plan: CCM Pharmacy Care Plan     Problem Identified: Problem: Hypertension, Hyperlipidemia, Diabetes, GERD, Overactive Bladder and Pain      Long-Range Goal: Patient-Specific Goal   Start Date: 05/31/2020  Expected End Date: 05/31/2021  Recent Progress: On track  Priority: High  Note:   Current Barriers:  Unable to independently afford treatment regimen Unable to independently monitor therapeutic efficacy Unable to achieve control of diabetes   Pharmacist Clinical Goal(s):  Patient will verbalize ability to afford treatment regimen achieve adherence to monitoring guidelines and medication adherence to achieve therapeutic efficacy achieve control of diabetes as evidenced by A1c  through collaboration with PharmD and provider.   Interventions: 1:1 collaboration with [name], MD regarding development and update of comprehensive plan of care as evidenced by provider attestation and co-signature Inter-disciplinary care team collaboration (see longitudinal plan of care) Comprehensive medication review performed; medication list updated in electronic medical record  Hypertension (BP goal <130/80) -Controlled -Current treatment: Lisinopril - HCTZ 10-12.5 mg 1 tablet daily - Appropriate, Effective, Safe, Accessible -Medications previously tried: none  -Current home readings: does not check -Current dietary habits: does add salt to food -Current exercise habits: yardwork but no formal exercsie -Denies hypotensive/hypertensive symptoms -Educated  on Daily salt intake goal < 2300 mg; Exercise goal of 150 minutes per week; Importance of home blood pressure monitoring; Proper BP monitoring technique; -Counseled to monitor BP at home weekly, document, and provide log at future appointments -Counseled on diet and exercise extensively Recommended to continue current medication Recommended lower sodium salt substitute  Hyperlipidemia: (LDL goal < 70) -Not ideally controlled -Current treatment: Rosuvastatin 40 mg 1 tablet daily - Appropriate, Query effective, Safe, Accessible -Medications previously tried: none  -Current dietary patterns: eating take out fast food which includes fried foods; uses canola oil when cooking -Current exercise habits: yardwork but no formal exercise -Educated on Cholesterol goals;  Benefits of statin for ASCVD risk reduction; Importance of limiting foods high in cholesterol; Exercise goal of 150 minutes per week; -Counseled on diet and exercise extensively Recommended to continue current medication  Diabetes (A1c goal <7%) -Uncontrolled -Current medications: Novolog 100 unit/mL inject 7 units plus sliding scale with breakfast, 9 base units with lunch and 11 with dinner- Appropriate, Query effective, Safe, Accessible Tresiba 65 units daily - Appropriate, Query effective, Safe, Accessible -Medications previously tried: pioglitazone (edema) -Current home glucose readings: downloaded CGM data fasting glucose: some overnight high blood sugars post prandial glucose: after meals high blood sugars -Denies hypoglycemic/hyperglycemic symptoms -Current meal patterns:  breakfast: 2 eggs, 2 strips of bacon, small bowl of grits or hash browns & gravy lunch: chicken nuggets or hot dog or cheeseburger or fish sandwich or nothing dinner: biggest meal: meat, vegetable, creamed potatoes or mac n'cheese snacks: some late night snacking drinks: diet drinks -Current exercise: busy with death in the family -Educated on  A1c and blood sugar goals; Exercise goal of 150 minutes per week; Continuous glucose monitoring; Carbohydrate counting and/or plate method -Counseled to check feet daily and get  yearly eye exams -Counseled on diet and exercise extensively Recommended using sliding scale with the decreasing to a base of 7 units with breakfast and 9 with lunch and 11 with dinner. Recommended avoiding overcorrecting for avoidance of lows.  GERD (Goal: minimize symptoms of heartburn) -Controlled -Current treatment  Omeprazole 40 mg 1 capsule daily - Appropriate, Effective, Query Safe, Accessible -Medications previously tried: none  -Counseled on non-pharmacologic management of symptoms such as elevating the head of your bed, avoiding eating 2-3 hours before bed, avoiding triggering foods such as acidic, spicy, or fatty foods, eating smaller meals, and wearing clothes that are loose around the waist  Overactive bladder (Goal: minimize symptoms) -Controlled -Current treatment  Oxybutynin XL 5 mg 1 tablet daily at bedtime as needed (taking in the morning sometimes) - Appropriate, Effective, Query Safe, Accessible -Medications previously tried: none  - Patient only takes as needed  Pain (Goal: minimize symptoms) -Controlled -Current treatment  Diclofenac 50 mg 1 tablet three times daily - was taking twice a day - Appropriate, Effective, Safe, Accessible Gabapentin 100 mg 1 capsule at bedtime as needed  - Appropriate, Effective, Safe, Accessible Methocarbamol 500 mg 1 tablet every 8 hours as needed - takes it in AM or sometimes in PM - Appropriate, Effective, Safe, Accessible -Medications previously tried: none  -Recommended to continue current medication   Health Maintenance -Vaccine gaps: shingrix, COVID booster -Current therapy:  Vitamin C 1000 mg 1 tablet daily Aspirin 81 mg 1 tablet daily  Betamethasone 0.05% ointment - poison oak Clotrimazole-betamethasone - yeast, during the summer Magnesium 400  mg 1 capsule daily -Educated on Cost vs benefit of each product must be carefully weighed by individual consumer -Patient is satisfied with current therapy and denies issues -Recommended to continue current medication  Patient Goals/Self-Care Activities Patient will:  - take medications as prescribed check glucose with Dexcom, document, and provide at future appointments check blood pressure weekly, document, and provide at future appointments target a minimum of 150 minutes of moderate intensity exercise weekly  Follow Up Plan: Telephone follow up appointment with care management team member scheduled for:2 weeks       Patient verbalizes understanding of instructions and care plan provided today and agrees to view in [place]. Active MyChart status and patient understanding of how to access instructions and care plan via MyChart confirmed with patient.    Telephone follow up appointment with pharmacy team member scheduled for: 2 weeks  Viona Gilmore, [facility]

## 2021-08-19 ENCOUNTER — Encounter: Payer: Self-pay | Admitting: Family Medicine

## 2021-08-19 ENCOUNTER — Ambulatory Visit (INDEPENDENT_AMBULATORY_CARE_PROVIDER_SITE_OTHER): Payer: Medicare Other | Admitting: Family Medicine

## 2021-08-19 VITALS — BP 150/70 | HR 75 | Temp 97.9°F | Ht 63.5 in | Wt 257.1 lb

## 2021-08-19 DIAGNOSIS — M25559 Pain in unspecified hip: Secondary | ICD-10-CM | POA: Diagnosis not present

## 2021-08-19 DIAGNOSIS — M159 Polyosteoarthritis, unspecified: Secondary | ICD-10-CM | POA: Diagnosis not present

## 2021-08-19 DIAGNOSIS — M7061 Trochanteric bursitis, right hip: Secondary | ICD-10-CM | POA: Diagnosis not present

## 2021-08-19 MED ORDER — METHYLPREDNISOLONE ACETATE 40 MG/ML IJ SUSP
40.0000 mg | Freq: Once | INTRAMUSCULAR | Status: AC
  Administered 2021-08-19: 40 mg via INTRAMUSCULAR

## 2021-08-19 MED ORDER — CELECOXIB 200 MG PO CAPS: 200.0000 mg | ORAL_CAPSULE | Freq: Every day | ORAL | 1 refills | Status: DC

## 2021-08-19 NOTE — Progress Notes (Signed)
Established Patient Office Visit  Subjective   Patient ID: Katherine Costa, female    DOB: [birth year]  Age: 71 y.o. MRN: [id number]  Chief Complaint  Patient presents with   Arthritis    Patient complains of arthritis in hips and neck,     HPI   Katherine Costa is seen as a work in with increasing joint pain especially right lateral hip region.  She states that she has been helping her sister past couple weeks is doing significant renovations.  She has been going up and down stairs repeatedly and has had tremendous difficulty past few days secondary to right hip pain.  She had a fall back in January with avulsion type fracture off the greater trochanter of the left hip.  Has had some ongoing left hip pain since then but right hip is predominantly problem now.  No medial or anterior pain.  She has tried taking ibuprofen up to 4 times daily and Tylenol with minimal relief.  Also has known chronic osteoarthritis involving her knees and cervical spine.  Has recently had some worsening neck pains.  She specifically inquires about Celebrex.  She does have type 2 diabetes with recent A1c 8.0%.  No chronic kidney disease.  Past Medical History:  Diagnosis Date   Arthritis    ASYMPTOMATIC POSTMENOPAUSAL STATUS 05/21/2009   DIABETES MELLITUS, TYPE I 08/24/2006   Family history of colon cancer    HERPES LABIALIS 03/13/2009   HYPERCHOLESTEROLEMIA 02/09/2008   Hyperlipidemia    HYPERTENSION 08/24/2006   Hypertension    LIVER DISORDER 03/17/2010   Tubulovillous adenoma of colon    2010    Past Surgical History:  Procedure Laterality Date   ABDOMINAL HYSTERECTOMY     endometriosis; menorrhagia   ACHILLES TENDON REPAIR  2010   right heel   CHOLECYSTECTOMY  2001   TOTAL KNEE ARTHROPLASTY Left 09/13/2015   Procedure: TOTAL KNEE ARTHROPLASTY;  Surgeon: Earlie Server, MD;  Location: [facility];  Service: Orthopedics;  Laterality: Left;    reports that she has never smoked. She has never used smokeless tobacco.  She reports that she does not drink alcohol and does not use drugs. family history includes Cancer in her mother and sister; Colon cancer in her mother and sister; Diabetes in her brother, brother, daughter, and sister; Heart attack (age of onset: 42) in her father; Heart disease in her brother and paternal grandmother; Heart disease (age of onset: 51) in her daughter; Kidney failure in her daughter; Prostate cancer in her maternal grandfather. Allergies  Allergen Reactions   Pioglitazone     REACTION: Edema    Review of Systems  Constitutional:  Negative for chills and fever.  Musculoskeletal:  Positive for joint pain and neck pain.      Objective:     BP (!) 150/70 (BP Location: Left Arm, Patient Position: Sitting, Cuff Size: Normal)   Pulse 75   Temp 97.9 F (36.6 C) (Oral)   Ht 5' 3.5" (1.613 m)   Wt 257 lb 1.6 oz (116.6 kg)   SpO2 98%   BMI 44.83 kg/m    Physical Exam Vitals reviewed.  Cardiovascular:     Rate and Rhythm: Normal rate and regular rhythm.  Pulmonary:     Effort: Pulmonary effort is normal.     Breath sounds: Normal breath sounds.  Musculoskeletal:     Comments: Full range of motion right hip.  She has tenderness localized over the greater trochanteric bursa.  Straight leg raise negative.  Excellent range of motion also in the left hip.  Neurological:     Mental Status: She is alert.     Comments: No focal weakness lower extremities.      No results found for any visits on 08/19/21.    The 10-year ASCVD risk score (Arnett DK, et al., 2019) is: 23.2%    Assessment & Plan:   Problem List Items Addressed This Visit       Unprioritized   Osteoarthritis of multiple joints   Relevant Medications   celecoxib (CELEBREX) 200 MG capsule   Other Visit Diagnoses     Greater trochanteric bursitis of right hip    -  Primary   Relevant Medications   methylPREDNISolone acetate (DEPO-MEDROL) injection 40 mg (Completed)     We discussed risk and  benefits of steroid injection right lateral hip into the bursa region.  She does have type 2 diabetes and is aware that any steroid use will likely exacerbate short-term her blood sugar control.  We discussed low risk of bruising, bleeding, infection.  Patient consented.  Right lateral hip region prepped with Betadine.  Using 1-1/2 inch 25-gauge needle injected 40 mg of Depo-Medrol and 2 cc of plain Xylocaine and patient tolerated well.  -She had questions regarding use of Celebrex.  We discussed potential risk factors of any nonsteroidal.  We agreed to limited use of Celebrex 200 mg 1 daily but avoid concomitant use of over-the-counter ibuprofen.  May supplement with Tylenol as needed.  No follow-ups on file.    Carolann Littler, MD

## 2021-08-25 ENCOUNTER — Telehealth: Payer: Self-pay

## 2021-08-25 DIAGNOSIS — N3000 Acute cystitis without hematuria: Secondary | ICD-10-CM | POA: Diagnosis not present

## 2021-08-25 DIAGNOSIS — R509 Fever, unspecified: Secondary | ICD-10-CM | POA: Diagnosis not present

## 2021-08-25 DIAGNOSIS — R197 Diarrhea, unspecified: Secondary | ICD-10-CM | POA: Diagnosis not present

## 2021-08-25 DIAGNOSIS — R531 Weakness: Secondary | ICD-10-CM | POA: Diagnosis not present

## 2021-08-25 NOTE — Telephone Encounter (Signed)
Spoke to pt spouse that was transferred to me from Triage nurse. Per nurse pt needed to be seen in 2 hrs. Per spouse pt is having diarrhea, nausea and body aches with a low grade fever.  Advise spouse that we did not have any appointments for the day and to take pt to the urgent care and keep [place] informed of pt status. Spouse verbalized understanding. Nothing further needed at this time.

## 2021-08-28 ENCOUNTER — Telehealth: Payer: Self-pay | Admitting: Pharmacist

## 2021-08-28 NOTE — Chronic Care Management (AMB) (Signed)
    Chronic Care Management Pharmacy Assistant   Name: Katherine Costa  MRN: [id number] DOB: 03/06/1950  08/29/2021 APPOINTMENT REMINDER  Katherine Costa was reminded to have all medications, supplements and any blood glucose and blood pressure readings and Dexcom available for review with [name], Pharm. D, at her telephone visit on 08/29/2021 at 2:00.  Care Gaps: AWV - previous message sent to Katherine Costa Last  BP - 150/70 on 08/19/2021 Last A1C - 8.0 on 04/09/2021 Shingrix - never done Covid booster - overdue Eye exam - overdue Colonoscopy - overdue Pnemonia vaccine - overdue Flu - due  Star Rating Drug: Lisinopril-HCTZ10-12.'5mg'$  - last filled 08/06/2021 90 DS at [facility] verified with Amanda Rosuvastatin '20mg'$  - last filled 07/23/2021 90 DS at [facility] verified with [name]  Any gaps in medications fill history? No  [name]  Catering manager [phone]

## 2021-08-29 ENCOUNTER — Ambulatory Visit: Payer: Medicare Other | Admitting: Pharmacist

## 2021-08-29 DIAGNOSIS — E109 Type 1 diabetes mellitus without complications: Secondary | ICD-10-CM

## 2021-08-29 DIAGNOSIS — I10 Essential (primary) hypertension: Secondary | ICD-10-CM

## 2021-08-29 NOTE — Progress Notes (Signed)
Chronic Care Management Pharmacy Note  09/12/2021 Name:  Katherine Costa MRN:  [id number] DOB:  Jun 20, 1950  Summary: Pt has had less low blood sugars over the last 2 weeks Pt is not checking BP at home  Recommendations/Changes made from today's visit: -Recommended adjusting sliding scale to be a base of 7 units at breakfast, 9 units at lunch and 11 units at dinner -Recommended switching to [place] once out of Novolog insulin -Recommended weekly BP monitoring at home  Plan: PCP appt in 3 weeks - will print Dexcom report for visit Follow up after PCP visit   Subjective: Katherine Costa is an 71 y.o. year old female who is a primary patient of Koberlein, Steele Berg, MD (Inactive).  The CCM team was consulted for assistance with disease management and care coordination needs.    Engaged with patient by telephone for follow up visit in response to provider referral for pharmacy case management and/or care coordination services.   Consent to Services:  The patient was given information about Chronic Care Management services, agreed to services, and gave verbal consent prior to initiation of services.  Please see initial visit note for detailed documentation.   Patient Care Team: [name], MD (Inactive) as PCP - General (Family Medicine) Maisie Fus, MD (Inactive) as Attending Physician (Obstetrics and Gynecology) Lorelle Gibbs, MD (Radiology) Viona Gilmore, [facility] as Pharmacist (Pharmacist)  Recent office visits: 08/19/21 Carolann Littler, MD: Patient presented for arthritis pain. Administered steroid injection. Prescribed Celebrex PRN.  05/23/21 Micheline Rough, MD: Patient presented for annual exam. No medication changes.  Recent consult visits: 05/01/21 Marchia Bond (ortho): Patient presented for follow up post left femur fracture. Unable to access notes.  03/03/21 Marchia Bond (ortho): Patient presented for pain in left knee. Unable to access  notes.  Hospital visits: 2/28-03/26/21 Patient presented to [facility] ED due to sepsis. Unable to access notes from hospitalization.  03/23/21 Patient presented to [facility] for nasal congestion and cough.  Objective:  Lab Results  Component Value Date   CREATININE 0.90 04/09/2021   BUN 22 04/09/2021   GFR 65.64 04/09/2021   GFRNONAA >60 09/14/2015   GFRAA >60 09/14/2015   NA 135 04/09/2021   K 3.8 04/09/2021   CALCIUM 9.7 04/09/2021   CO2 26 04/09/2021   GLUCOSE 163 (H) 04/09/2021    Lab Results  Component Value Date/Time   HGBA1C 8.0 (H) 04/09/2021 03:33 PM   HGBA1C 8.1 (A) 09/16/2020 08:45 AM   HGBA1C 9.2 (H) 06/19/2020 07:17 AM   FRUCTOSAMINE 337 (H) 09/29/2017 10:05 AM   FRUCTOSAMINE 339 (H) 06/29/2017 10:57 AM   GFR 65.64 04/09/2021 03:33 PM   GFR 74.78 09/16/2020 09:45 AM   MICROALBUR <0.7 04/09/2021 03:33 PM   MICROALBUR <0.7 03/18/2020 09:49 AM    Last diabetic Eye exam: No results found for: "HMDIABEYEEXA"  Last diabetic Foot exam: No results found for: "HMDIABFOOTEX"   Lab Results  Component Value Date   CHOL 217 (H) 04/09/2021   HDL 82.90 04/09/2021   LDLCALC 112 (H) 04/09/2021   LDLDIRECT 110.6 09/02/2011   TRIG 112.0 04/09/2021   CHOLHDL 3 04/09/2021       Latest Ref Rng & Units 04/09/2021    3:33 PM 09/16/2020    9:45 AM 06/19/2020    7:17 AM  Hepatic Function  Total Protein 6.0 - 8.3 g/dL 7.2  6.3  6.1   Albumin 3.5 - 5.2 g/dL 3.9  3.6  3.7  AST 0 - 37 U/L _0 ALT 0 - 35 U/L _1 Alk Phosphatase 39 - 117 U/L 124  112  101   Total Bilirubin 0.2 - 1.2 mg/dL 0.5  0.6  0.6     Lab Results  Component Value Date/Time   TSH 1.90 09/28/2019 07:21 AM   TSH 1.31 06/29/2018 08:01 AM       Latest Ref Rng & Units 04/09/2021    3:33 PM 09/16/2020    9:45 AM 04/11/2020   10:00 AM  CBC  WBC 4.0 - 10.5 K/uL 8.2  6.3  5.8   Hemoglobin 12.0 - 15.0 g/dL 13.8  14.0  14.2   Hematocrit 36.0 - 46.0 % 41.5   41.6  43.7   Platelets 150.0 - 400.0 K/uL 349.0  259.0  282     No results found for: "VD25OH"  Clinical ASCVD: No  The 10-year ASCVD risk score (Arnett DK, et al., 2019) is: 25.9%   Values used to calculate the score:     Age: 71 years     Sex: Female     Is Non-Hispanic African American: No     Diabetic: Yes     Tobacco smoker: No     Systolic Blood Pressure: 564 mmHg     Is BP treated: Yes     HDL Cholesterol: 82.9 mg/dL     Total Cholesterol: 217 mg/dL       05/23/2021   10:10 AM 04/09/2021    3:44 PM 03/18/2020    9:02 AM  Depression screen PHQ 2/9  Decreased Interest 0 0 0  Down, Depressed, Hopeless 0 0 0  PHQ - 2 Score 0 0 0  Altered sleeping 0 0   Tired, decreased energy 0 0   Change in appetite 0 0   Feeling bad or failure about yourself  0 0   Trouble concentrating 0 0   Moving slowly or fidgety/restless 0 0   Suicidal thoughts 0 0   PHQ-9 Score 0 0   Difficult doing work/chores Not difficult at all        Social History   Tobacco Use  Smoking Status Never  Smokeless Tobacco Never   BP Readings from Last 3 Encounters:  08/19/21 (!) 150/70  05/23/21 122/62  04/09/21 132/68   Pulse Readings from Last 3 Encounters:  08/19/21 75  05/23/21 73  04/09/21 85   Wt Readings from Last 3 Encounters:  08/19/21 257 lb 1.6 oz (116.6 kg)  05/23/21 251 lb 6.4 oz (114 kg)  04/09/21 250 lb 11.2 oz (113.7 kg)   BMI Readings from Last 3 Encounters:  08/19/21 44.83 kg/m  05/23/21 43.83 kg/m  04/09/21 41.72 kg/m    Assessment/Interventions: Review of patient past medical history, allergies, medications, health status, including review of consultants reports, laboratory and other test data, was performed as part of comprehensive evaluation and provision of chronic care management services.   SDOH:  (Social Determinants of Health) assessments and interventions performed: Yes   SDOH Screenings   Alcohol Screen: Not on file  Depression (PHQ2-9): Low Risk   (05/23/2021)   Depression (PHQ2-9)    PHQ-2 Score: 0  Financial Resource Strain: Medium Risk (06/07/2020)   Overall Financial Resource Strain (CARDIA)    Difficulty of Paying Living Expenses: Somewhat hard  Food Insecurity: Not on file  Housing: Not on file  Physical Activity: Not on file  Social Connections: Not on  file  Stress: Not on file  Tobacco Use: Low Risk  (08/19/2021)   Patient History    Smoking Tobacco Use: Never    Smokeless Tobacco Use: Never    Passive Exposure: Not on file  Transportation Needs: No Transportation Needs (06/07/2020)   PRAPARE - Transportation    Lack of Transportation (Medical): No    Lack of Transportation (Non-Medical): No   CCM Care Plan  Allergies  Allergen Reactions   Pioglitazone     REACTION: Edema    Medications Reviewed Today     Reviewed by Nilda Riggs, CMA (Certified Medical Assistant) on 08/19/21 at 1125  Med List Status: <None>   Medication Order Taking? Sig Documenting Provider Last Dose Status Informant  Ascorbic Acid (VITAMIN C) 1000 MG tablet [id number] Yes Take 1,000 mg by mouth daily. [provider] Taking Active   aspirin 81 MG EC tablet [id number] Yes Take 81 mg by mouth daily. Swallow whole. [provider] Taking Active   augmented betamethasone dipropionate (DIPROLENE) 0.05 % ointment [id number] Yes Apply topically 3 (three) times daily as needed. rash Renato Shin, MD Taking Active   clotrimazole-betamethasone Donalynn Furlong) cream [id number] Yes APPLY TOPICALLY THREE TIMES A DAY AS NEEDED FOR RASH. [name], MD Taking Active   Continuous Blood Gluc Receiver ([place]) DEVI [id number] Yes 1 Device by Does not apply route as needed. [name], MD Taking Active   Continuous Blood Gluc Sensor (DEXCOM G6 SENSOR) MISC [id number] Yes 1 Device by Does not apply route as needed. [name], MD Taking Active   Continuous Blood Gluc Transmit (DEXCOM G6 TRANSMITTER) MISC [id number] Yes  1 transmitter every 10 days Koberlein, Steele Berg, MD Taking Active   insulin aspart (NOVOLOG) 100 UNIT/ML injection [id number] Yes Inject into the skin 3 (three) times daily before meals. Base of 7 units with breakfast, 9 units with lunch and 11 units with dinner plus the sliding scale. [provider] Taking Active   insulin degludec [facility] FLEXTOUCH) 100 UNIT/ML FlexTouch Pen [id number] Yes Sample  exp: 03/2021 and lot [id number].  Patient taking differently: Inject 65 Units into the skin daily.   [name], MD Taking Active   lisinopril-hydrochlorothiazide (ZESTORETIC) 10-12.5 MG tablet [id number] Yes TAKE 1 TABLET BY MOUTH  DAILY Koberlein, Junell C, MD Taking Active   Magnesium 400 MG CAPS [id number] Yes Take by mouth daily. [provider] Taking Active   methocarbamol (ROBAXIN) 500 MG tablet [id number] Yes Take 1 tablet (500 mg total) by mouth every 8 (eight) hours as needed for muscle spasms. Jessy Oto, MD Taking Active   omeprazole (PRILOSEC) 40 MG capsule [id number] Yes Take 1 capsule (40 mg total) by mouth daily. Dutch Quint B, FNP Taking Active   oxybutynin (DITROPAN-XL) 5 MG 24 hr tablet [id number] Yes TAKE ONE TABLET BY MOUTH AT BEDTIME Koberlein, Steele Berg, MD Taking Active   rosuvastatin (CRESTOR) 20 MG tablet [id number] Yes TAKE 1 TABLET BY MOUTH DAILY Kennyth Arnold, FNP Taking Active             Patient Active Problem List   Diagnosis Date Noted   Osteoarthritis of multiple joints 08/19/2021   Urinary incontinence 06/24/2018   Gastroesophageal reflux disease 02/11/2018   Primary localized osteoarthritis of left knee 09/13/2015   Pain in joint, shoulder region 06/01/2014   Morbid obesity ([place]) 04/21/2013   Encounter for long-term (current) use of other medications 09/02/2011   LIVER DISORDER 03/17/2010   ASYMPTOMATIC POSTMENOPAUSAL STATUS  05/21/2009   HERPES LABIALIS 03/13/2009   HYPERCHOLESTEROLEMIA 02/09/2008   Type 1 diabetes mellitus ([place])  08/24/2006   Essential hypertension 08/24/2006    Immunization History  Administered Date(s) Administered   Fluad Quad(high Dose 65+) 10/14/2018, 03/18/2020   Influenza Whole 10/26/2008, 12/06/2009, 10/02/2010   Influenza, High Dose Seasonal PF 10/29/2017   Influenza,inj,Quad PF,6+ Mos 10/31/2013   Influenza-Unspecified 11/16/2015, 11/17/2016   PFIZER(Purple Top)SARS-COV-2 Vaccination 02/26/2019, 03/19/2019, 01/04/2020   Pneumococcal Conjugate-13 09/13/2019   Pneumococcal Polysaccharide-23 05/21/2009   Tdap 06/29/2017   Zoster, Live 12/11/2014   Dexcom CGM report below:       Patient's dog got attacked by another dog and her hip bone was shattered. She is not confined in a crate for 2 months.  Patient doesn't have the symptoms with the UTI and they found that she had a UTI. Patient was going to urinate often.   Conditions to be addressed/monitored:  Hypertension, Hyperlipidemia, Diabetes, GERD, Overactive Bladder and Pain  Conditions addressed this visit: Diabetes, hypertension   Care Plan : CCM Pharmacy Care Plan  Updates made by Viona Gilmore, [place] since 09/12/2021 12:00 AM     Problem: Problem: Hypertension, Hyperlipidemia, Diabetes, GERD, Overactive Bladder and Pain      Long-Range Goal: Patient-Specific Goal   Start Date: 05/31/2020  Expected End Date: 05/31/2021  Recent Progress: On track  Priority: High  Note:   Current Barriers:  Unable to independently afford treatment regimen Unable to independently monitor therapeutic efficacy Unable to achieve control of diabetes   Pharmacist Clinical Goal(s):  Patient will verbalize ability to afford treatment regimen achieve adherence to monitoring guidelines and medication adherence to achieve therapeutic efficacy achieve control of diabetes as evidenced by A1c  through collaboration with PharmD and provider.   Interventions: 1:1 collaboration with [name], MD regarding development and update of  comprehensive plan of care as evidenced by provider attestation and co-signature Inter-disciplinary care team collaboration (see longitudinal plan of care) Comprehensive medication review performed; medication list updated in electronic medical record  Hypertension (BP goal <130/80) -Controlled -Current treatment: Lisinopril - HCTZ 10-12.5 mg 1 tablet daily - Appropriate, Effective, Safe, Accessible -Medications previously tried: none  -Current home readings: does not check -Current dietary habits: does add salt to food -Current exercise habits: yardwork but no formal exercsie -Denies hypotensive/hypertensive symptoms -Educated on Daily salt intake goal < 2300 mg; Exercise goal of 150 minutes per week; Importance of home blood pressure monitoring; Proper BP monitoring technique; -Counseled to monitor BP at home weekly, document, and provide log at future appointments -Counseled on diet and exercise extensively Recommended to continue current medication Recommended checking BP routinely at home.  Hyperlipidemia: (LDL goal < 70) -Not ideally controlled -Current treatment: Rosuvastatin 40 mg 1 tablet daily - Appropriate, Query effective, Safe, Accessible -Medications previously tried: none  -Current dietary patterns: eating take out fast food which includes fried foods; uses canola oil when cooking -Current exercise habits: yardwork but no formal exercise -Educated on Cholesterol goals;  Benefits of statin for ASCVD risk reduction; Importance of limiting foods high in cholesterol; Exercise goal of 150 minutes per week; -Counseled on diet and exercise extensively Recommended to continue current medication  Diabetes (A1c goal <7%) -Uncontrolled -Current medications: Novolog 100 unit/mL inject 7 units plus sliding scale with breakfast, 9 base units with lunch and 11 with dinner- Appropriate, Query effective, Safe, Accessible Tresiba 65 units daily - Appropriate, Query effective,  Safe, Accessible -Medications previously tried: pioglitazone (edema) -Current home glucose readings:  downloaded CGM data fasting glucose: some overnight high blood sugars post prandial glucose: after meals high blood sugars -Denies hypoglycemic/hyperglycemic symptoms -Current meal patterns:  breakfast: 2 eggs, 2 strips of bacon, small bowl of grits or hash browns & gravy lunch: chicken nuggets or hot dog or cheeseburger or fish sandwich or nothing dinner: biggest meal: meat, vegetable, creamed potatoes or mac n'cheese snacks: some late night snacking drinks: diet drinks -Current exercise: busy with death in the family -Educated on A1c and blood sugar goals; Exercise goal of 150 minutes per week; Continuous glucose monitoring; Carbohydrate counting and/or plate method -Counseled to check feet daily and get yearly eye exams -Counseled on diet and exercise extensively Recommended using sliding scale with the decreasing to a base of 7 units with breakfast and 9 with lunch and 11 with dinner. Recommended avoiding overcorrecting for avoidance of lows.  GERD (Goal: minimize symptoms of heartburn) -Controlled -Current treatment  Omeprazole 40 mg 1 capsule daily - Appropriate, Effective, Query Safe, Accessible -Medications previously tried: none  -Counseled on non-pharmacologic management of symptoms such as elevating the head of your bed, avoiding eating 2-3 hours before bed, avoiding triggering foods such as acidic, spicy, or fatty foods, eating smaller meals, and wearing clothes that are loose around the waist  Overactive bladder (Goal: minimize symptoms) -Controlled -Current treatment  Oxybutynin XL 5 mg 1 tablet daily at bedtime as needed (taking in the morning sometimes) - Appropriate, Effective, Query Safe, Accessible -Medications previously tried: none  - Patient only takes as needed  Pain (Goal: minimize symptoms) -Controlled -Current treatment  Diclofenac 50 mg 1 tablet three  times daily - was taking twice a day - Appropriate, Effective, Safe, Accessible Gabapentin 100 mg 1 capsule at bedtime as needed  - Appropriate, Effective, Safe, Accessible Methocarbamol 500 mg 1 tablet every 8 hours as needed - takes it in AM or sometimes in PM - Appropriate, Effective, Safe, Accessible -Medications previously tried: none  -Recommended to continue current medication   Health Maintenance -Vaccine gaps: shingrix, COVID booster -Current therapy:  Vitamin C 1000 mg 1 tablet daily Aspirin 81 mg 1 tablet daily  Betamethasone 0.05% ointment - poison oak Clotrimazole-betamethasone - yeast, during the summer Magnesium 400 mg 1 capsule daily -Educated on Cost vs benefit of each product must be carefully weighed by individual consumer -Patient is satisfied with current therapy and denies issues -Recommended to continue current medication  Patient Goals/Self-Care Activities Patient will:  - take medications as prescribed check glucose with Dexcom, document, and provide at future appointments check blood pressure weekly, document, and provide at future appointments target a minimum of 150 minutes of moderate intensity exercise weekly  Follow Up Plan: The care management team will reach out to the patient again over the next 30 days.        Medication Assistance:  Tyler Aas and Novolog obtained through [employer] medication assistance program.  Enrollment ends 01/25/22  Compliance/Adherence/Medication fill history: Care Gaps: Shingrix, COVID booster, foot exam, colonoscopy, eye exam, Prevnar20, influenza vaccine Last PCP BP: 150/70 on 08/19/2021 Last A1C - 8.0 on 04/09/2021  Star-Rating Drugs: Lisinopril-HCTZ10-12.45m - last [facility]/2021 [facility] 251[facility]- last [facility]/23/2021 [facility] Am[facility]ill BambergPatient's preferred pharmacy is:  [facility] [id number]: [phone]: [id number] AT [place] [address] [place] [address] Phone: [phone] Fax: [phone]  [facility] - [place], [address] [place] [address] Phone: [phone] Fax: [phone]   Uses pill box? Yes - once a week Pt endorses 100% compliance   We discussed: Current pharmacy is preferred with insurance plan and patient is satisfied with pharmacy services Patient decided to: Continue current medication management strategy  Care Plan and Follow Up Patient Decision:  Patient agrees to Care Plan and Follow-up.  Plan: The care management team will reach out to the patient again over the next 30 days.  [name], PharmD [facility] Clinical Pharmacist [place] [facility] [place]

## 2021-09-10 ENCOUNTER — Telehealth: Payer: Self-pay

## 2021-09-10 NOTE — Telephone Encounter (Signed)
Pt medication arrived for pick up. Lm for pt to return call.

## 2021-09-12 NOTE — Patient Instructions (Signed)
Hi Lilliona,  I hope your dog feels better soon!   Please try to check both your blood pressure and Joe's at least once a week to make sure your medications are working properly.  Please reach out to me if you have any questions or need anything before our follow up!  Best, Maddie  [name], PharmD, [place] Pharmacist [facility] [place]     Visit Information   Goals Addressed   None    Patient Care Plan: CCM Pharmacy Care Plan     Problem Identified: Problem: Hypertension, Hyperlipidemia, Diabetes, GERD, Overactive Bladder and Pain      Long-Range Goal: Patient-Specific Goal   Start Date: 05/31/2020  Expected End Date: 05/31/2021  Recent Progress: On track  Priority: High  Note:   Current Barriers:  Unable to independently afford treatment regimen Unable to independently monitor therapeutic efficacy Unable to achieve control of diabetes   Pharmacist Clinical Goal(s):  Patient will verbalize ability to afford treatment regimen achieve adherence to monitoring guidelines and medication adherence to achieve therapeutic efficacy achieve control of diabetes as evidenced by A1c  through collaboration with PharmD and provider.   Interventions: 1:1 collaboration with [name], MD regarding development and update of comprehensive plan of care as evidenced by provider attestation and co-signature Inter-disciplinary care team collaboration (see longitudinal plan of care) Comprehensive medication review performed; medication list updated in electronic medical record  Hypertension (BP goal <130/80) -Controlled -Current treatment: Lisinopril - HCTZ 10-12.5 mg 1 tablet daily - Appropriate, Effective, Safe, Accessible -Medications previously tried: none  -Current home readings: does not check -Current dietary habits: does add salt to food -Current exercise habits: yardwork but no formal exercsie -Denies hypotensive/hypertensive  symptoms -Educated on Daily salt intake goal < 2300 mg; Exercise goal of 150 minutes per week; Importance of home blood pressure monitoring; Proper BP monitoring technique; -Counseled to monitor BP at home weekly, document, and provide log at future appointments -Counseled on diet and exercise extensively Recommended to continue current medication Recommended checking BP routinely at home.  Hyperlipidemia: (LDL goal < 70) -Not ideally controlled -Current treatment: Rosuvastatin 40 mg 1 tablet daily - Appropriate, Query effective, Safe, Accessible -Medications previously tried: none  -Current dietary patterns: eating take out fast food which includes fried foods; uses canola oil when cooking -Current exercise habits: yardwork but no formal exercise -Educated on Cholesterol goals;  Benefits of statin for ASCVD risk reduction; Importance of limiting foods high in cholesterol; Exercise goal of 150 minutes per week; -Counseled on diet and exercise extensively Recommended to continue current medication  Diabetes (A1c goal <7%) -Uncontrolled -Current medications: Novolog 100 unit/mL inject 7 units plus sliding scale with breakfast, 9 base units with lunch and 11 with dinner- Appropriate, Query effective, Safe, Accessible Tresiba 65 units daily - Appropriate, Query effective, Safe, Accessible -Medications previously tried: pioglitazone (edema) -Current home glucose readings: downloaded CGM data fasting glucose: some overnight high blood sugars post prandial glucose: after meals high blood sugars -Denies hypoglycemic/hyperglycemic symptoms -Current meal patterns:  breakfast: 2 eggs, 2 strips of bacon, small bowl of grits or hash browns & gravy lunch: chicken nuggets or hot dog or cheeseburger or fish sandwich or nothing dinner: biggest meal: meat, vegetable, creamed potatoes or mac n'cheese snacks: some late night snacking drinks: diet drinks -Current exercise: busy with death in the  family -Educated on A1c and blood sugar goals; Exercise goal of 150 minutes per week; Continuous glucose monitoring; Carbohydrate counting and/or plate method -Counseled  to check feet daily and get yearly eye exams -Counseled on diet and exercise extensively Recommended using sliding scale with the decreasing to a base of 7 units with breakfast and 9 with lunch and 11 with dinner. Recommended avoiding overcorrecting for avoidance of lows.  GERD (Goal: minimize symptoms of heartburn) -Controlled -Current treatment  Omeprazole 40 mg 1 capsule daily - Appropriate, Effective, Query Safe, Accessible -Medications previously tried: none  -Counseled on non-pharmacologic management of symptoms such as elevating the head of your bed, avoiding eating 2-3 hours before bed, avoiding triggering foods such as acidic, spicy, or fatty foods, eating smaller meals, and wearing clothes that are loose around the waist  Overactive bladder (Goal: minimize symptoms) -Controlled -Current treatment  Oxybutynin XL 5 mg 1 tablet daily at bedtime as needed (taking in the morning sometimes) - Appropriate, Effective, Query Safe, Accessible -Medications previously tried: none  - Patient only takes as needed  Pain (Goal: minimize symptoms) -Controlled -Current treatment  Diclofenac 50 mg 1 tablet three times daily - was taking twice a day - Appropriate, Effective, Safe, Accessible Gabapentin 100 mg 1 capsule at bedtime as needed  - Appropriate, Effective, Safe, Accessible Methocarbamol 500 mg 1 tablet every 8 hours as needed - takes it in AM or sometimes in PM - Appropriate, Effective, Safe, Accessible -Medications previously tried: none  -Recommended to continue current medication   Health Maintenance -Vaccine gaps: shingrix, COVID booster -Current therapy:  Vitamin C 1000 mg 1 tablet daily Aspirin 81 mg 1 tablet daily  Betamethasone 0.05% ointment - poison oak Clotrimazole-betamethasone - yeast, during the  summer Magnesium 400 mg 1 capsule daily -Educated on Cost vs benefit of each product must be carefully weighed by individual consumer -Patient is satisfied with current therapy and denies issues -Recommended to continue current medication  Patient Goals/Self-Care Activities Patient will:  - take medications as prescribed check glucose with Dexcom, document, and provide at future appointments check blood pressure weekly, document, and provide at future appointments target a minimum of 150 minutes of moderate intensity exercise weekly  Follow Up Plan: The care management team will reach out to the patient again over the next 30 days.        Patient verbalizes understanding of instructions and care plan provided today and agrees to view in [place]. Active MyChart status and patient understanding of how to access instructions and care plan via MyChart confirmed with patient.    The pharmacy team will reach out to the patient again over the next 30 days.   Viona Gilmore, [facility]

## 2021-09-19 ENCOUNTER — Encounter: Payer: Self-pay | Admitting: Family Medicine

## 2021-09-19 ENCOUNTER — Ambulatory Visit (INDEPENDENT_AMBULATORY_CARE_PROVIDER_SITE_OTHER): Payer: Medicare Other | Admitting: Family Medicine

## 2021-09-19 ENCOUNTER — Other Ambulatory Visit: Payer: Self-pay

## 2021-09-19 VITALS — BP 116/60 | HR 70 | Temp 98.1°F | Wt 255.6 lb

## 2021-09-19 DIAGNOSIS — I10 Essential (primary) hypertension: Secondary | ICD-10-CM

## 2021-09-19 DIAGNOSIS — N3946 Mixed incontinence: Secondary | ICD-10-CM | POA: Diagnosis not present

## 2021-09-19 DIAGNOSIS — M159 Polyosteoarthritis, unspecified: Secondary | ICD-10-CM | POA: Diagnosis not present

## 2021-09-19 DIAGNOSIS — E109 Type 1 diabetes mellitus without complications: Secondary | ICD-10-CM

## 2021-09-19 LAB — POCT GLYCOSYLATED HEMOGLOBIN (HGB A1C): Hemoglobin A1C: 7.1 % — AB (ref 4.0–5.6)

## 2021-09-19 MED ORDER — CELECOXIB 200 MG PO CAPS: 200.0000 mg | ORAL_CAPSULE | Freq: Every day | ORAL | 1 refills | Status: DC

## 2021-09-19 MED ORDER — RYBELSUS 7 MG PO TABS: 7.0000 mg | ORAL_TABLET | Freq: Every day | ORAL | 0 refills | Status: DC

## 2021-09-19 NOTE — Assessment & Plan Note (Signed)
BP is well controlled today in office. Pt is on: Current hypertension medications:      Sig   lisinopril-hydrochlorothiazide (ZESTORETIC) 10-12.5 MG tablet (Taking) TAKE 1 TABLET BY MOUTH  DAILY     Will continue this medication

## 2021-09-19 NOTE — Progress Notes (Signed)
Established Patient Office Visit  Subjective   Patient ID: MERCADIES CO, female    DOB: 1951/01/23  Age: 71 y.o. MRN: [id number]  Chief Complaint  Patient presents with  . Establish Care    Patient is here for transition of care visit. Patient reports that she has had 2-3 UTIs in the last year, states , states that she takes the oxybutinin for urinary incontinence. Was concerned that the ditropan was causing her to be at increased risk of UTI's. States she was hospitalized at the end of February at   Type 1 DM-- A1C performed in office today. I also reviewed her Dexcom data and she reports she is doing very well with her blood sugars. Takes 65 units of Tresiba in the morning and is on TID Novolog with meals. I reviewed her last set of lab work and her medications, she is on statin therapy as well as ACEI. Patient continues to report polyuria as her main complaint associated with her DM. She is overdue for her yearly eye exam, I reminded her that she needed to schedule this.      Current Outpatient Medications  Medication Instructions  . aspirin EC 81 mg, Oral, Daily, Swallow whole.  . augmented betamethasone dipropionate (DIPROLENE) 0.05 % ointment Topical, 3 times daily PRN, rash  . celecoxib (CELEBREX) 200 mg, Oral, Daily  . clotrimazole-betamethasone (LOTRISONE) cream APPLY TOPICALLY THREE TIMES A DAY AS NEEDED FOR RASH.  Marland Kitchen Continuous Blood Gluc Receiver (DEXCOM G6 RECEIVER) DEVI 1 Device, Does not apply, As needed  . Continuous Blood Gluc Sensor (DEXCOM G6 SENSOR) MISC 1 Device, Does not apply, As needed  . Continuous Blood Gluc Transmit (DEXCOM G6 TRANSMITTER) MISC 1 transmitter every 10 days  . insulin aspart (NOVOLOG) 100 UNIT/ML injection Subcutaneous, 3 times daily before meals, Base of 7 units with breakfast, 9 units with lunch and 11 units with dinner plus the sliding scale.  . insulin degludec (TRESIBA FLEXTOUCH) 100 UNIT/ML FlexTouch Pen Sample  exp: 03/2021 and lot  [id number].  Marland Kitchen lisinopril-hydrochlorothiazide (ZESTORETIC) 10-12.5 MG tablet 1 tablet, Oral, Daily  . Magnesium 400 MG CAPS Oral, Daily  . methocarbamol (ROBAXIN) 500 mg, Oral, Every 8 hours PRN  . omeprazole (PRILOSEC) 40 mg, Oral, Daily  . oxybutynin (DITROPAN-XL) 5 MG 24 hr tablet TAKE ONE TABLET BY MOUTH AT BEDTIME  . rosuvastatin (CRESTOR) 20 MG tablet TAKE 1 TABLET BY MOUTH DAILY  . Rybelsus 7 mg, Oral, Daily  . Rybelsus 7 mg, Oral, Daily  . vitamin C 1,000 mg, Oral, Daily    Patient Active Problem List   Diagnosis Date Noted  . Osteoarthritis of multiple joints 08/19/2021  . Urinary incontinence 06/24/2018  . Gastroesophageal reflux disease 02/11/2018  . Primary localized osteoarthritis of left knee 09/13/2015  . Pain in joint, shoulder region 06/01/2014  . Morbid obesity ([place]) 04/21/2013  . Encounter for long-term (current) use of other medications 09/02/2011  . LIVER DISORDER 03/17/2010  . ASYMPTOMATIC POSTMENOPAUSAL STATUS 05/21/2009  . HERPES LABIALIS 03/13/2009  . HYPERCHOLESTEROLEMIA 02/09/2008  . Type 1 diabetes mellitus ([place]) 08/24/2006  . Essential hypertension 08/24/2006      ROS    Objective:     BP 116/60 (BP Location: Left Arm, Patient Position: Sitting, Cuff Size: Large)   Pulse 70   Temp 98.1 F (36.7 C) (Oral)   Wt 255 lb 9.6 oz (115.9 kg)   SpO2 98%   BMI 44.57 kg/m  BP Readings from Last 3 Encounters:  09/19/21 116/60  08/19/21 (!) 150/70  05/23/21 122/62   Wt Readings from Last 3 Encounters:  09/19/21 255 lb 9.6 oz (115.9 kg)  08/19/21 257 lb 1.6 oz (116.6 kg)  05/23/21 251 lb 6.4 oz (114 kg)      Physical Exam   Results for orders placed or performed in visit on 09/19/21  POC HgB A1c  Result Value Ref Range   Hemoglobin A1C 7.1 (A) 4.0 - 5.6 %   HbA1c POC (<> result, manual entry)     HbA1c, POC (prediabetic range)     HbA1c, POC (controlled diabetic range)      Last metabolic panel Lab Results  Component Value Date    GLUCOSE 163 (H) 04/09/2021   NA 135 04/09/2021   K 3.8 04/09/2021   CL 99 04/09/2021   CO2 26 04/09/2021   BUN 22 04/09/2021   CREATININE 0.90 04/09/2021   GFRNONAA >60 09/14/2015   CALCIUM 9.7 04/09/2021   PROT 7.2 04/09/2021   ALBUMIN 3.9 04/09/2021   BILITOT 0.5 04/09/2021   ALKPHOS 124 (H) 04/09/2021   AST 18 04/09/2021   ALT 18 04/09/2021   ANIONGAP 9 09/14/2015   Last lipids Lab Results  Component Value Date   CHOL 217 (H) 04/09/2021   HDL 82.90 04/09/2021   LDLCALC 112 (H) 04/09/2021   LDLDIRECT 110.6 09/02/2011   TRIG 112.0 04/09/2021   CHOLHDL 3 04/09/2021      The 10-year ASCVD risk score (Arnett DK, et al., 2019) is: 16.3%    Assessment & Plan:   Problem List Items Addressed This Visit       Cardiovascular and Mediastinum   Essential hypertension    BP is well controlled today in office. Pt is on: Current hypertension medications:       Sig   lisinopril-hydrochlorothiazide (ZESTORETIC) 10-12.5 MG tablet (Taking) TAKE 1 TABLET BY MOUTH  DAILY      Will continue this medication        Endocrine   Type 1 diabetes mellitus ([place]) - Primary    We had a long discussion about improving her dietary choices and handouts were given. We discussed the use of Rybelsus to help with weight loss. Even though she is a type 1 I feel that she may also have a component of insulin resistance as well due to her weight as evidenced by her high tresiba dose as well.       Relevant Medications   Semaglutide (RYBELSUS) 7 MG TABS   Other Relevant Orders   POC HgB A1c (Completed)     Musculoskeletal and Integument   Osteoarthritis of multiple joints    Pt reports that the celebrex 200 mg daily is working well for her, she would like refills today. I reviewed her kidney function which is WNL, will continue the NSAIDS.      Relevant Medications   celecoxib (CELEBREX) 200 MG capsule    Return in about 3 months (around 12/20/2021) for folow up A1C.    Farrel Conners, MD

## 2021-09-19 NOTE — Assessment & Plan Note (Signed)
We had a long discussion about improving her dietary choices and handouts were given. We discussed the use of Rybelsus to help with weight loss. Even though she is a type 1 I feel that she may also have a component of insulin resistance as well due to her weight as evidenced by her high tresiba dose as well.

## 2021-09-19 NOTE — Assessment & Plan Note (Signed)
Pt reports that the celebrex 200 mg daily is working well for her, she would like refills today. I reviewed her kidney function which is WNL, will continue the NSAIDS.

## 2021-09-22 NOTE — Assessment & Plan Note (Signed)
The story appears to represent mixed urge and stress incontinence. I recommended she start Pelvic floor exercises at home to help strengthen the muscles and reduce her symptoms. Weight loss will also be of benefit to help reduce intraabdominal pressure. If we improve her blood sugars it will help reduce the polyuria as well. Patient was given handouts on the Kegel exercises and she will try this at home. Continue the same dose of ditropan for her for now. If she has no improvement by the next visit in 3 months then will consider increasing the dose.

## 2021-10-01 ENCOUNTER — Other Ambulatory Visit: Payer: Self-pay | Admitting: *Deleted

## 2021-10-01 ENCOUNTER — Telehealth: Payer: Self-pay | Admitting: Pharmacist

## 2021-10-01 MED ORDER — LISINOPRIL-HYDROCHLOROTHIAZIDE 10-12.5 MG PO TABS: 1.0000 | ORAL_TABLET | Freq: Every day | ORAL | 1 refills | Status: DC

## 2021-10-01 NOTE — Chronic Care Management (AMB) (Signed)
    Chronic Care Management Pharmacy Assistant   Name: RHYLIE STEHR  MRN: [id number] DOB: [birth year]  Reason for Encounter: Follow up Rybelsus patient assistance.  Spoke with Rhianna at [facility], Rybelsus has been approved through 01/25/2022. Patients medication will ship to them in 10-14 business days. Patient has been notified.     [place] Pharmacist Assistant [phone]

## 2021-10-09 ENCOUNTER — Telehealth: Payer: Self-pay | Admitting: Pharmacist

## 2021-10-09 NOTE — Chronic Care Management (AMB) (Signed)
    Chronic Care Management Pharmacy Assistant   Name: ELWYN LOWDEN  MRN: [id number] DOB: 03-28-1950  Reason for Encounter: Schedule follow up visit with [name] Clinical Pharmacist.  Appointment scheduled with patient for 10/31/2021.   [place] Pharmacist Assistant [phone]

## 2021-10-09 NOTE — Telephone Encounter (Signed)
Patient called me as she experienced a lot of side effects with the Rybelsus. She reported she barely ate and felt extreme nausea every day and took it for 2 weeks straight. She did stop it on her own because of how bad she felt and wanted to know the next steps. Will discuss with PCP and could consider 3 mg dose if able to provide samples.

## 2021-10-10 NOTE — Telephone Encounter (Signed)
Sure! Maybe Victoza would be better? Less side effects??

## 2021-10-13 NOTE — Telephone Encounter (Signed)
Yes thanks 

## 2021-10-21 NOTE — Telephone Encounter (Signed)
Received samples of Rybelsus 3 mg. Will provide 1 month sample to see if patient tolerates and set aside in the front office for patient to pick up. She verbalized her understanding and will let me know how she tolerates the lower dose.

## 2021-10-22 ENCOUNTER — Ambulatory Visit (INDEPENDENT_AMBULATORY_CARE_PROVIDER_SITE_OTHER): Payer: Medicare Other | Admitting: *Deleted

## 2021-10-22 DIAGNOSIS — Z23 Encounter for immunization: Secondary | ICD-10-CM

## 2021-10-23 ENCOUNTER — Ambulatory Visit (INDEPENDENT_AMBULATORY_CARE_PROVIDER_SITE_OTHER): Payer: Medicare Other | Admitting: Adult Health

## 2021-10-23 ENCOUNTER — Encounter: Payer: Self-pay | Admitting: Family Medicine

## 2021-10-23 ENCOUNTER — Encounter: Payer: Self-pay | Admitting: Adult Health

## 2021-10-23 VITALS — BP 140/80 | HR 75 | Temp 97.8°F | Ht 63.5 in | Wt 255.0 lb

## 2021-10-23 DIAGNOSIS — R350 Frequency of micturition: Secondary | ICD-10-CM | POA: Diagnosis not present

## 2021-10-23 LAB — POCT URINALYSIS DIPSTICK
Bilirubin, UA: NEGATIVE
Blood, UA: NEGATIVE
Glucose, UA: NEGATIVE
Ketones, UA: NEGATIVE
Nitrite, UA: NEGATIVE
Protein, UA: NEGATIVE
Spec Grav, UA: 1.015 (ref 1.010–1.025)
Urobilinogen, UA: 0.2 E.U./dL
pH, UA: 6 (ref 5.0–8.0)

## 2021-10-23 MED ORDER — CIPROFLOXACIN HCL 500 MG PO TABS: 500.0000 mg | ORAL_TABLET | Freq: Two times a day (BID) | ORAL | 0 refills | Status: AC

## 2021-10-23 NOTE — Progress Notes (Signed)
Subjective:    Patient ID: Katherine Costa, female    DOB: 1950-06-10, 72 y.o.   MRN: [id number]  HPI 71 year old female who  has a past medical history of Arthritis, ASYMPTOMATIC POSTMENOPAUSAL STATUS (05/21/2009), DIABETES MELLITUS, TYPE I (08/24/2006), Family history of colon cancer, HERPES LABIALIS (03/13/2009), HYPERCHOLESTEROLEMIA (02/09/2008), Hyperlipidemia, HYPERTENSION (08/24/2006), Hypertension, LIVER DISORDER (03/17/2010), and Tubulovillous adenoma of colon.  She is a patient of Dr. Legrand Como who I am seeing today for an acute issue of concern for UTI. Her symptom started yesterday   Her symptoms include that of urinary frequency, which she reports is the only symptoms she ever has. She has been hospitalized earlier this year for sepsis from suspected UTI.    Review of Systems See HPI   Past Medical History:  Diagnosis Date   Arthritis    ASYMPTOMATIC POSTMENOPAUSAL STATUS 05/21/2009   DIABETES MELLITUS, TYPE I 08/24/2006   Family history of colon cancer    HERPES LABIALIS 03/13/2009   HYPERCHOLESTEROLEMIA 02/09/2008   Hyperlipidemia    HYPERTENSION 08/24/2006   Hypertension    LIVER DISORDER 03/17/2010   Tubulovillous adenoma of colon    2010     Social History   Socioeconomic History   Marital status: Married    Spouse name: Not on file   Number of children: 3   Years of education: Not on file   Highest education level: Not on file  Occupational History   Occupation: Teacher, music  Tobacco Use   Smoking status: Never   Smokeless tobacco: Never  Substance and Sexual Activity   Alcohol use: No    Alcohol/week: 0.0 standard drinks of alcohol   Drug use: No   Sexual activity: Not on file  Other Topics Concern   Not on file  Social History Narrative   PT does not get regular exercise   3 girls   Social Determinants of Health   Financial Resource Strain: Medium Risk (06/07/2020)   Overall Financial Resource Strain (CARDIA)    Difficulty of Paying  Living Expenses: Somewhat hard  Food Insecurity: Not on file  Transportation Needs: No Transportation Needs (06/07/2020)   PRAPARE - Hydrologist (Medical): No    Lack of Transportation (Non-Medical): No  Physical Activity: Not on file  Stress: Not on file  Social Connections: Not on file  Intimate Partner Violence: Not on file    Past Surgical History:  Procedure Laterality Date   ABDOMINAL HYSTERECTOMY     endometriosis; menorrhagia   ACHILLES TENDON REPAIR  2010   right heel   CHOLECYSTECTOMY  2001   TOTAL KNEE ARTHROPLASTY Left 09/13/2015   Procedure: TOTAL KNEE ARTHROPLASTY;  Surgeon: Earlie Server, MD;  Location: [facility];  Service: Orthopedics;  Laterality: Left;    Family History  Problem Relation Age of Onset   Cancer Mother        Colon Cancer, Pancreatic Cancer   Colon cancer Mother    Diabetes Sister    Colon cancer Sister    Cancer Sister        Breast Cancer   Heart attack Father 23   Diabetes Brother    Heart disease Brother        related to agent orange   Prostate cancer Maternal Grandfather    Heart disease Paternal Grandmother    Diabetes Brother    Diabetes Daughter    Kidney failure Daughter    Heart disease Daughter 44    Allergies  Allergen Reactions   Pioglitazone     REACTION: Edema    Current Outpatient Medications on File Prior to Visit  Medication Sig Dispense Refill   Ascorbic Acid (VITAMIN C) 1000 MG tablet Take 1,000 mg by mouth daily.     aspirin 81 MG EC tablet Take 81 mg by mouth daily. Swallow whole.     augmented betamethasone dipropionate (DIPROLENE) 0.05 % ointment Apply topically 3 (three) times daily as needed. rash 50 g 2   celecoxib (CELEBREX) 200 MG capsule Take 1 capsule (200 mg total) by mouth daily. 90 capsule 1   clotrimazole-betamethasone (LOTRISONE) cream APPLY TOPICALLY THREE TIMES A DAY AS NEEDED FOR RASH. 45 g 2   Continuous Blood Gluc Receiver (DEXCOM G6 RECEIVER) DEVI 1 Device by  Does not apply route as needed. 1 each 0   Continuous Blood Gluc Sensor (DEXCOM G6 SENSOR) MISC 1 Device by Does not apply route as needed. 1 each 3   Continuous Blood Gluc Transmit (DEXCOM G6 TRANSMITTER) MISC 1 transmitter every 10 days 1 each 3   insulin aspart (NOVOLOG) 100 UNIT/ML injection Inject into the skin 3 (three) times daily before meals. Base of 7 units with breakfast, 9 units with lunch and 11 units with dinner plus the sliding scale.     insulin degludec (TRESIBA FLEXTOUCH) 100 UNIT/ML FlexTouch Pen Sample  exp: 03/2021 and lot [id number]. (Patient taking differently: Inject 65 Units into the skin daily.) 6 mL 0   lisinopril-hydrochlorothiazide (ZESTORETIC) 10-12.5 MG tablet Take 1 tablet by mouth daily. 90 tablet 1   Magnesium 400 MG CAPS Take by mouth daily.     methocarbamol (ROBAXIN) 500 MG tablet Take 1 tablet (500 mg total) by mouth every 8 (eight) hours as needed for muscle spasms. 40 tablet 1   omeprazole (PRILOSEC) 40 MG capsule Take 1 capsule (40 mg total) by mouth daily. 90 capsule 1   oxybutynin (DITROPAN-XL) 5 MG 24 hr tablet TAKE ONE TABLET BY MOUTH AT BEDTIME 90 tablet 0   rosuvastatin (CRESTOR) 20 MG tablet TAKE 1 TABLET BY MOUTH DAILY 90 tablet 3   Semaglutide (RYBELSUS) 7 MG TABS Take 7 mg by mouth daily. 30 tablet 0   Semaglutide (RYBELSUS) 7 MG TABS Take 7 mg by mouth daily. 30 tablet 0   No current facility-administered medications on file prior to visit.    BP (!) 140/80   Pulse 75   Temp 97.8 F (36.6 C) (Oral)   Ht 5' 3.5" (1.613 m)   Wt 255 lb (115.7 kg)   SpO2 99%   BMI 44.46 kg/m       Objective:   Physical Exam Vitals and nursing note reviewed.  Constitutional:      Appearance: Normal appearance.  Cardiovascular:     Rate and Rhythm: Normal rate and regular rhythm.     Pulses: Normal pulses.     Heart sounds: Normal heart sounds.  Pulmonary:     Effort: Pulmonary effort is normal.     Breath sounds: Normal breath sounds.  Abdominal:      General: Abdomen is flat. Bowel sounds are normal. There is no distension.     Palpations: Abdomen is soft.     Tenderness: There is no abdominal tenderness. There is no right CVA tenderness or left CVA tenderness.  Skin:    General: Skin is warm and dry.  Neurological:     Mental Status: She is alert.  Psychiatric:        Mood and  Affect: Mood normal.        Behavior: Behavior normal.        Thought Content: Thought content normal.        Judgment: Judgment normal.       Assessment & Plan:  1. Frequent urination  - POC Urinalysis Dipstick + 2 leuks. Will cover due to symptoms and history with Cipro which she was not resistant to in her last urine culture.  - Culture, Urine - ciprofloxacin (CIPRO) 500 MG tablet; Take 1 tablet (500 mg total) by mouth 2 (two) times daily for 3 days.  Dispense: 6 tablet; Refill: 0  Dorothyann Peng, NP

## 2021-10-23 NOTE — Telephone Encounter (Signed)
Noted  

## 2021-10-26 LAB — URINE CULTURE
MICRO NUMBER:: 13983234
SPECIMEN QUALITY:: ADEQUATE

## 2021-10-28 ENCOUNTER — Ambulatory Visit (INDEPENDENT_AMBULATORY_CARE_PROVIDER_SITE_OTHER): Payer: Medicare Other | Admitting: Family Medicine

## 2021-10-28 ENCOUNTER — Other Ambulatory Visit: Payer: Self-pay | Admitting: *Deleted

## 2021-10-28 ENCOUNTER — Encounter: Payer: Self-pay | Admitting: Family Medicine

## 2021-10-28 VITALS — BP 142/72 | HR 79 | Temp 97.7°F | Ht 63.5 in | Wt 251.0 lb

## 2021-10-28 DIAGNOSIS — E109 Type 1 diabetes mellitus without complications: Secondary | ICD-10-CM

## 2021-10-28 DIAGNOSIS — I10 Essential (primary) hypertension: Secondary | ICD-10-CM | POA: Diagnosis not present

## 2021-10-28 MED ORDER — RYBELSUS 3 MG PO TABS: 3.0000 mg | ORAL_TABLET | Freq: Every day | ORAL | 0 refills | Status: DC

## 2021-10-28 NOTE — Assessment & Plan Note (Signed)
BP remains elevated today, repeat BP was slightly better, I recommended that she continue to check her BP at home and bring her readings to the next visit. Continue the medication listed below Current hypertension medications:      Sig   lisinopril-hydrochlorothiazide (ZESTORETIC) 10-12.5 MG tablet (Taking) Take 1 tablet by mouth daily.

## 2021-10-28 NOTE — Assessment & Plan Note (Addendum)
Sugars stable, she is tolerating the 3 mg dose of rybelsus, will consider increasing back to 7 mg at the next visit, may also add PRN ondansetron for nausea with the 7 mg dose. RTC in November. Forms filled out for the [facility] today in visit.

## 2021-10-28 NOTE — Progress Notes (Signed)
Established Patient Office Visit  Subjective   Patient ID: Katherine Costa, female    DOB: [birth year]  Age: 71 y.o. 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 She reports she could not tolerate the 7 mg rybelsus, had to reduce down to 3 mg daily due to the nausea. States that the 3 mg is helping with her appetite but not with her blood sugars. She continues to use the [facility] CGM and reports no changes in her blood sugars.   HTN--BP remains elevated today, checked twice in visit, she reports that she just took her medications right before coming in to the office. She reports anxiety because of the [facility] paperwork that needs filled out. She has been checking her BP at home. She has lost 4 pounds since her last visit. States she is doing well managing her diet.    Current Outpatient Medications  Medication Instructions   aspirin EC 81 mg, Oral, Daily, Swallow whole.   augmented betamethasone dipropionate (DIPROLENE) 0.05 % ointment Topical, 3 times daily PRN, rash   celecoxib (CELEBREX) 200 mg, Oral, Daily   clotrimazole-betamethasone (LOTRISONE) cream APPLY TOPICALLY THREE TIMES A DAY AS NEEDED FOR RASH.   Continuous Blood Gluc Receiver (DEXCOM G6 RECEIVER) DEVI 1 Device, Does not apply, As needed   Continuous Blood Gluc Sensor (DEXCOM G6 SENSOR) MISC 1 Device, Does not apply, As needed   Continuous Blood Gluc Transmit (DEXCOM G6 TRANSMITTER) MISC 1 transmitter every 10 days   insulin aspart (NOVOLOG) 100 UNIT/ML injection Subcutaneous, 3 times daily before meals, Base of 7 units with breakfast, 9 units with lunch and 11 units with dinner plus the sliding scale.   insulin degludec (TRESIBA FLEXTOUCH) 100 UNIT/ML FlexTouch Pen Sample  exp: 03/2021 and lot [id number].    lisinopril-hydrochlorothiazide (ZESTORETIC) 10-12.5 MG tablet 1 tablet, Oral, Daily   Magnesium 400 MG CAPS Oral, Daily   methocarbamol (ROBAXIN) 500 mg, Oral, Every 8 hours PRN   omeprazole (PRILOSEC) 40 mg, Oral, Daily   oxybutynin (DITROPAN-XL) 5 MG 24 hr tablet TAKE ONE TABLET BY MOUTH AT BEDTIME   rosuvastatin (CRESTOR) 20 MG tablet TAKE 1 TABLET BY MOUTH DAILY   Rybelsus 7 mg, Oral, Daily   Rybelsus 7 mg, Oral, Daily   vitamin C 1,000 mg, Oral, Daily    Patient Active Problem List   Diagnosis Date Noted   Osteoarthritis of multiple joints 08/19/2021   Urinary incontinence 06/24/2018   Gastroesophageal reflux disease 02/11/2018   Primary localized osteoarthritis of left knee 09/13/2015   Pain in joint, shoulder region 06/01/2014   Morbid obesity ([place]) 04/21/2013   Encounter for long-term (current) use of other medications 09/02/2011   LIVER DISORDER 03/17/2010   ASYMPTOMATIC POSTMENOPAUSAL STATUS 05/21/2009   HERPES LABIALIS 03/13/2009   HYPERCHOLESTEROLEMIA 02/09/2008   Type 1 diabetes mellitus ([place]) 08/24/2006   Essential hypertension 08/24/2006      Review of Systems  All other systems reviewed and are negative.     Objective:     BP (!) 142/72 (BP Location: Right Arm, Patient Position: Sitting)   Pulse 79   Temp 97.7 F (36.5 C) (Oral)   Ht 5' 3.5" (1.613 m)   Wt 251 lb (113.9 kg)  SpO2 98%   BMI 43.77 kg/m  BP Readings from Last 3 Encounters:  10/28/21 (!) 142/72  10/23/21 (!) 140/80  09/19/21 116/60      Physical Exam Vitals reviewed.  Constitutional:      Appearance: Normal appearance. She is well-groomed. She is obese.  HENT:     Head: Normocephalic and atraumatic.  Eyes:     Conjunctiva/sclera: Conjunctivae normal.  Cardiovascular:     Heart sounds: S1 normal and S2 normal.  Pulmonary:     Effort: Pulmonary effort is normal.  Musculoskeletal:        General: Normal range of motion.     Cervical back: Normal range of motion.      Right lower leg: No edema.     Left lower leg: No edema.  Skin:    General: Skin is warm and dry.  Neurological:     Mental Status: She is alert and oriented to person, place, and time. Mental status is at baseline.     Gait: Gait is intact.  Psychiatric:        Mood and Affect: Mood and affect normal.        Speech: Speech normal.        Behavior: Behavior normal.        Judgment: Judgment normal.      No results found for any visits on 10/28/21.    The 10-year ASCVD risk score (Arnett DK, et al., 2019) is: 23.5%    Assessment & Plan:   Problem List Items Addressed This Visit       Cardiovascular and Mediastinum   Essential hypertension - Primary    BP remains elevated today, repeat BP was slightly better, I recommended that she continue to check her BP at home and bring her readings to the next visit. Continue the medication listed below Current hypertension medications:       Sig   lisinopril-hydrochlorothiazide (ZESTORETIC) 10-12.5 MG tablet (Taking) Take 1 tablet by mouth daily.              Endocrine   Type 1 diabetes mellitus (HCC)    Sugars stable, she is tolerating the 3 mg dose of rybelsus, will consider increasing back to 7 mg at the next visit, may also add PRN ondansetron for nausea with the 7 mg dose. RTC in November. Forms filled out for the [facility] today in visit.       No follow-ups on file.    Farrel Conners, MD

## 2021-10-30 ENCOUNTER — Telehealth: Payer: Self-pay | Admitting: Pharmacist

## 2021-10-30 NOTE — Progress Notes (Signed)
Chronic Care Management Pharmacy Note  10/31/2021 Name:  Katherine Costa MRN:  [id number] DOB:  Nov 22, 1950  Summary: Pt has had a few low blood sugar readings after the morning Fiasp injection  Recommendations/Changes made from today's visit: -Recommended adjusting sliding scale to be a base of 5 units at breakfast, 9 units at lunch and 11 units at dinner -Recommended supplementing with calcium and vitamin D daily -Recommended discussing with PCP about bisphosphonate therapy given hip fracture  Plan: PCP appt in 1 month - will print Dexcom report for visit Follow up after PCP visit  Subjective: Katherine Costa is an 71 y.o. year old female who is a primary patient of Legrand Como, Royston Cowper, MD.  The CCM team was consulted for assistance with disease management and care coordination needs.    Engaged with patient by telephone for follow up visit in response to provider referral for pharmacy case management and/or care coordination services.   Consent to Services:  The patient was given information about Chronic Care Management services, agreed to services, and gave verbal consent prior to initiation of services.  Please see initial visit note for detailed documentation.   Patient Care Team: Farrel Conners, MD as PCP - General (Family Medicine) Maisie Fus, MD (Inactive) as Attending Physician (Obstetrics and Gynecology) Lorelle Gibbs, MD (Radiology) Viona Gilmore, [facility] as Pharmacist (Pharmacist)  Recent office visits: 10/28/21 Loralyn Freshwater, MD: Patient presented for [facility] form to be filled out.   10/23/21 Dorothyann Peng, NP: Patient presented for frequent urination. Prescribed Cipro BID x 3 days.   08/19/21 Carolann Littler, MD: Patient presented for arthritis pain. Administered steroid injection. Prescribed Celebrex PRN.  05/23/21 Micheline Rough, MD: Patient presented for annual exam. No medication changes.  Recent consult visits: 05/01/21 Marchia Bond (ortho):  Patient presented for follow up post left femur fracture. Unable to access notes.  03/03/21 Marchia Bond (ortho): Patient presented for pain in left knee. Unable to access notes.  Hospital visits: 2/28-03/26/21 Patient presented to [facility] ED due to sepsis. Unable to access notes from hospitalization.  03/23/21 Patient presented to [facility] for nasal congestion and cough.  Objective:  Lab Results  Component Value Date   CREATININE 0.90 04/09/2021   BUN 22 04/09/2021   GFR 65.64 04/09/2021   GFRNONAA >60 09/14/2015   GFRAA >60 09/14/2015   NA 135 04/09/2021   K 3.8 04/09/2021   CALCIUM 9.7 04/09/2021   CO2 26 04/09/2021   GLUCOSE 163 (H) 04/09/2021    Lab Results  Component Value Date/Time   HGBA1C 7.1 (A) 09/19/2021 08:14 AM   HGBA1C 8.0 (H) 04/09/2021 03:33 PM   HGBA1C 8.1 (A) 09/16/2020 08:45 AM   HGBA1C 9.2 (H) 06/19/2020 07:17 AM   FRUCTOSAMINE 337 (H) 09/29/2017 10:05 AM   FRUCTOSAMINE 339 (H) 06/29/2017 10:57 AM   GFR 65.64 04/09/2021 03:33 PM   GFR 74.78 09/16/2020 09:45 AM   MICROALBUR <0.7 04/09/2021 03:33 PM   MICROALBUR <0.7 03/18/2020 09:49 AM    Last diabetic Eye exam: No results found for: "HMDIABEYEEXA"  Last diabetic Foot exam: No results found for: "HMDIABFOOTEX"   Lab Results  Component Value Date   CHOL 217 (H) 04/09/2021   HDL 82.90 04/09/2021   LDLCALC 112 (H) 04/09/2021   LDLDIRECT 110.6 09/02/2011   TRIG 112.0 04/09/2021   CHOLHDL 3 04/09/2021       Latest Ref Rng & Units 04/09/2021    3:33 PM 09/16/2020  9:45 AM 06/19/2020    7:17 AM  Hepatic Function  Total Protein 6.0 - 8.3 g/dL 7.2  6.3  6.1   Albumin 3.5 - 5.2 g/dL 3.9  3.6  3.7   AST 0 - 37 U/L _0 ALT 0 - 35 U/L _1 Alk Phosphatase 39 - 117 U/L 124  112  101   Total Bilirubin 0.2 - 1.2 mg/dL 0.5  0.6  0.6     Lab Results  Component Value Date/Time   TSH 1.90 09/28/2019 07:21 AM   TSH 1.31 06/29/2018 08:01 AM        Latest Ref Rng & Units 04/09/2021    3:33 PM 09/16/2020    9:45 AM 04/11/2020   10:00 AM  CBC  WBC 4.0 - 10.5 K/uL 8.2  6.3  5.8   Hemoglobin 12.0 - 15.0 g/dL 13.8  14.0  14.2   Hematocrit 36.0 - 46.0 % 41.5  41.6  43.7   Platelets 150.0 - 400.0 K/uL 349.0  259.0  282     No results found for: "VD25OH"  Clinical ASCVD: No  The 10-year ASCVD risk score (Arnett DK, et al., 2019) is: 23.5%   Values used to calculate the score:     Age: 25 years     Sex: Female     Is Non-Hispanic African American: No     Diabetic: Yes     Tobacco smoker: No     Systolic Blood Pressure: 308 mmHg     Is BP treated: Yes     HDL Cholesterol: 82.9 mg/dL     Total Cholesterol: 217 mg/dL       05/23/2021   10:10 AM 04/09/2021    3:44 PM 03/18/2020    9:02 AM  Depression screen PHQ 2/9  Decreased Interest 0 0 0  Down, Depressed, Hopeless 0 0 0  PHQ - 2 Score 0 0 0  Altered sleeping 0 0   Tired, decreased energy 0 0   Change in appetite 0 0   Feeling bad or failure about yourself  0 0   Trouble concentrating 0 0   Moving slowly or fidgety/restless 0 0   Suicidal thoughts 0 0   PHQ-9 Score 0 0   Difficult doing work/chores Not difficult at all        Social History   Tobacco Use  Smoking Status Never  Smokeless Tobacco Never   BP Readings from Last 3 Encounters:  10/28/21 (!) 142/72  10/23/21 (!) 140/80  09/19/21 116/60   Pulse Readings from Last 3 Encounters:  10/28/21 79  10/23/21 75  09/19/21 70   Wt Readings from Last 3 Encounters:  10/28/21 251 lb (113.9 kg)  10/23/21 255 lb (115.7 kg)  09/19/21 255 lb 9.6 oz (115.9 kg)   BMI Readings from Last 3 Encounters:  10/28/21 43.77 kg/m  10/23/21 44.46 kg/m  09/19/21 44.57 kg/m    Assessment/Interventions: Review of patient past medical history, allergies, medications, health status, including review of consultants reports, laboratory and other test data, was performed as part of comprehensive evaluation and provision of chronic  care management services.   SDOH:  (Social Determinants of Health) assessments and interventions performed: Yes SDOH Interventions    Flowsheet Row Chronic Care Management from 10/31/2021 in [place] [facility] [place] Management from 05/31/2020 in [place] [facility] [place]  SDOH Interventions    Transportation Interventions Intervention Not Indicated Intervention Not  Indicated  Financial Strain Interventions -- Other (Comment)  [working on patient assistance for insulin]       SDOH Screenings   Transportation Needs: No Transportation Needs (10/31/2021)  Depression (PHQ2-9): Low Risk  (05/23/2021)  Financial Resource Strain: Medium Risk (06/07/2020)  Tobacco Use: Low Risk  (10/28/2021)   CCM Care Plan  Allergies  Allergen Reactions   Pioglitazone     REACTION: Edema    Medications Reviewed Today     Reviewed by Farrel Conners, MD (Physician) on 10/28/21 at 0850  Med List Status: <None>   Medication Order Taking? Sig Documenting Provider Last Dose Status Informant  Ascorbic Acid (VITAMIN C) 1000 MG tablet [id number] Yes Take 1,000 mg by mouth daily. [provider] Taking Active   aspirin 81 MG EC tablet [id number] Yes Take 81 mg by mouth daily. Swallow whole. [provider] Taking Active   augmented betamethasone dipropionate (DIPROLENE) 0.05 % ointment [id number] Yes Apply topically 3 (three) times daily as needed. rash Renato Shin, MD Taking Active   celecoxib (CELEBREX) 200 MG capsule [id number] Yes Take 1 capsule (200 mg total) by mouth daily. Farrel Conners, MD Taking Active   clotrimazole-betamethasone Donalynn Furlong) cream [id number] Yes APPLY TOPICALLY THREE TIMES A DAY AS NEEDED FOR RASH. [name], MD Taking Active   Continuous Blood Gluc Receiver ([place]) DEVI [id number] Yes 1 Device by Does not apply route as needed. [name], MD Taking Active   Continuous Blood Gluc Sensor (DEXCOM G6 SENSOR) MISC  [id number] Yes 1 Device by Does not apply route as needed. [name], MD Taking Active   Continuous Blood Gluc Transmit (DEXCOM G6 TRANSMITTER) MISC [id number] Yes 1 transmitter every 10 days Koberlein, Steele Berg, MD Taking Active   insulin aspart (NOVOLOG) 100 UNIT/ML injection [id number] Yes Inject into the skin 3 (three) times daily before meals. Base of 7 units with breakfast, 9 units with lunch and 11 units with dinner plus the sliding scale. [provider] Taking Active   insulin [facility] FLEXTOUCH) 100 UNIT/ML FlexTouch Pen [id number] Yes Sample  exp: 03/2021 and lot [id number].  Patient taking differently: Inject 65 Units into the skin daily.   [name], MD Taking Active   lisinopril-hydrochlorothiazide (ZESTORETIC) 10-12.5 MG tablet [id number] Yes Take 1 tablet by mouth daily. Farrel Conners, MD Taking Active   Magnesium 400 MG CAPS [id number] Yes Take by mouth daily. [provider] Taking Active   methocarbamol (ROBAXIN) 500 MG tablet [id number] Yes Take 1 tablet (500 mg total) by mouth every 8 (eight) hours as needed for muscle spasms. Jessy Oto, MD Taking Active   omeprazole (PRILOSEC) 40 MG capsule [id number] Yes Take 1 capsule (40 mg total) by mouth daily. Dutch Quint B, FNP Taking Active   oxybutynin (DITROPAN-XL) 5 MG 24 hr tablet [id number] Yes TAKE ONE TABLET BY MOUTH AT BEDTIME Koberlein, Steele Berg, MD Taking Active   rosuvastatin (CRESTOR) 20 MG tablet [id number] Yes TAKE 1 TABLET BY MOUTH DAILY Dutch Quint B, FNP Taking Active   Semaglutide (RYBELSUS) 7 MG TABS [id number] Yes Take 7 mg by mouth daily. Farrel Conners, MD Taking Active   Semaglutide [facility]) 7 MG TABS [id number] Yes Take 7 mg by mouth daily. Farrel Conners, MD Taking Active             Patient Active Problem List   Diagnosis Date Noted   Osteoarthritis of multiple joints 08/19/2021   Urinary incontinence 06/24/2018  Gastroesophageal reflux disease  02/11/2018   Primary localized osteoarthritis of left knee 09/13/2015   Pain in joint, shoulder region 06/01/2014   Morbid obesity ([place]) 04/21/2013   Encounter for long-term (current) use of other medications 09/02/2011   LIVER DISORDER 03/17/2010   ASYMPTOMATIC POSTMENOPAUSAL STATUS 05/21/2009   HERPES LABIALIS 03/13/2009   HYPERCHOLESTEROLEMIA 02/09/2008   Type 1 diabetes mellitus ([place]) 08/24/2006   Essential hypertension 08/24/2006    Immunization History  Administered Date(s) Administered   Fluad Quad(high Dose 65+) 10/14/2018, 03/18/2020, 10/22/2021   Influenza Whole 10/26/2008, 12/06/2009, 10/02/2010   Influenza, High Dose Seasonal PF 10/29/2017   Influenza,inj,Quad PF,6+ Mos 10/31/2013   Influenza-Unspecified 11/16/2015, 11/17/2016   PFIZER(Purple Top)SARS-COV-2 Vaccination 02/26/2019, 03/19/2019, 01/04/2020   Pneumococcal Conjugate-13 09/13/2019   Pneumococcal Polysaccharide-23 05/21/2009, 03/26/2021   Tdap 06/29/2017   Zoster, Live 12/11/2014   CGM report:    Patient reports she is eating late night snacks because she has been hungry. She wasn't sure if she should inject a few units of Fiasp if she was going to eat a snack.  Patient is not having any nausea with the 3 mg dose of Rybelsus. This is helping her appetite but it is not helping her blood sugars. She is trying to eat less carbs with dinner and more protein.  Patient does have some dairy and some fruit in her diet. She drinks milk in her coffee and does eat yogurt for a snack couple days a week and cheese almost every day.   Conditions to be addressed/monitored:  Hypertension, Hyperlipidemia, Diabetes, GERD, Overactive Bladder and Pain  Conditions addressed this visit: Diabetes, osteopenia  Care Plan : CCM Pharmacy Care Plan  Updates made by Viona Gilmore, [place] since 10/31/2021 12:00 AM     Problem: Problem: Hypertension, Hyperlipidemia, Diabetes, GERD, Overactive Bladder and Pain      Long-Range  Goal: Patient-Specific Goal   Start Date: 05/31/2020  Expected End Date: 05/31/2021  Recent Progress: On track  Priority: High  Note:   Current Barriers:  Unable to independently afford treatment regimen Unable to independently monitor therapeutic efficacy Unable to achieve control of diabetes   Pharmacist Clinical Goal(s):  Patient will verbalize ability to afford treatment regimen achieve adherence to monitoring guidelines and medication adherence to achieve therapeutic efficacy achieve control of diabetes as evidenced by A1c  through collaboration with PharmD and provider.   Interventions: 1:1 collaboration with Farrel Conners, MD regarding development and update of comprehensive plan of care as evidenced by provider attestation and co-signature Inter-disciplinary care team collaboration (see longitudinal plan of care) Comprehensive medication review performed; medication list updated in electronic medical record  Hypertension (BP goal <130/80) -Controlled -Current treatment: Lisinopril - HCTZ 10-12.5 mg 1 tablet daily - Appropriate, Effective, Safe, Accessible -Medications previously tried: none  -Current home readings: does not check -Current dietary habits: does add salt to food -Current exercise habits: yardwork but no formal exercsie -Denies hypotensive/hypertensive symptoms -Educated on Daily salt intake goal < 2300 mg; Exercise goal of 150 minutes per week; Importance of home blood pressure monitoring; Proper BP monitoring technique; -Counseled to monitor BP at home weekly, document, and provide log at future appointments -Counseled on diet and exercise extensively Recommended to continue current medication Recommended checking BP routinely at home.  Hyperlipidemia: (LDL goal < 70) -Not ideally controlled -Current treatment: Rosuvastatin 40 mg 1 tablet daily - Appropriate, Query effective, Safe, Accessible -Medications previously tried: none  -Current dietary  patterns: eating take out fast food which includes  fried foods; uses canola oil when cooking -Current exercise habits: yardwork but no formal exercise -Educated on Cholesterol goals;  Benefits of statin for ASCVD risk reduction; Importance of limiting foods high in cholesterol; Exercise goal of 150 minutes per week; -Counseled on diet and exercise extensively Recommended to continue current medication  Diabetes (A1c goal <7%) -Uncontrolled -Current medications: Fiasp 100 unit/mL inject 7 units plus sliding scale with breakfast, 9 base units with lunch and 11 with dinner- Appropriate, Query effective, Safe, Accessible Tresiba 65 units daily - Appropriate, Query effective, Safe, Accessible Rybelsus 3 mg 1 tablet daily - Appropriate, Query effective, Safe, Accessible -Medications previously tried: pioglitazone (edema) -Current home glucose readings: downloaded CGM data fasting glucose: some overnight high blood sugars post prandial glucose: after meals high blood sugars -Denies hypoglycemic/hyperglycemic symptoms -Current meal patterns:  breakfast: 2 eggs, 2 strips of bacon, small bowl of grits or hash browns & gravy lunch: chicken nuggets or hot dog or cheeseburger or fish sandwich or nothing dinner: biggest meal: meat, vegetable, creamed potatoes or mac n'cheese snacks: some late night snacking drinks: diet drinks -Current exercise: busy with death in the family -Educated on A1c and blood sugar goals; Exercise goal of 150 minutes per week; Continuous glucose monitoring; Carbohydrate counting and/or plate method -Counseled to check feet daily and get yearly eye exams -Counseled on diet and exercise extensively Recommended using sliding scale with the decreasing to a base of 5 units with breakfast and 9 with lunch and 11 with dinner. Recommended injecting 3-4 units of Fiasp with late night snacks.  GERD (Goal: minimize symptoms of heartburn) -Controlled -Current treatment   Omeprazole 40 mg 1 capsule daily - Appropriate, Effective, Query Safe, Accessible -Medications previously tried: none  -Counseled on non-pharmacologic management of symptoms such as elevating the head of your bed, avoiding eating 2-3 hours before bed, avoiding triggering foods such as acidic, spicy, or fatty foods, eating smaller meals, and wearing clothes that are loose around the waist  Overactive bladder (Goal: minimize symptoms) -Controlled -Current treatment  Oxybutynin XL 5 mg 1 tablet daily at bedtime as needed (taking in the morning sometimes) - Appropriate, Effective, Query Safe, Accessible -Medications previously tried: none  - Patient only takes as needed  Pain (Goal: minimize symptoms) -Controlled -Current treatment  Diclofenac 50 mg 1 tablet three times daily - was taking twice a day - Appropriate, Effective, Safe, Accessible Gabapentin 100 mg 1 capsule at bedtime as needed  - Appropriate, Effective, Safe, Accessible Methocarbamol 500 mg 1 tablet every 8 hours as needed - takes it in AM or sometimes in PM - Appropriate, Effective, Safe, Accessible -Medications previously tried: none  -Recommended to continue current medication  Osteopenia (Goal prevent fractures) -Uncontrolled -Last DEXA Scan: 03/2020   T-Score femoral neck: -1.5  T-Score total hip: n/a  T-Score lumbar spine: 1.7  T-Score forearm radius: -0.40  10-year probability of major osteoporotic fracture: 8.1%  10-year probability of hip fracture: 0.9% -Patient is a candidate for pharmacologic treatment due to history of hip fracture  -Current treatment  No medications -Medications previously tried: none  -Recommend [phone] units of vitamin D daily. Recommend 1200 mg of calcium daily from dietary and supplemental sources. Recommend weight-bearing and muscle strengthening exercises for building and maintaining bone density. -Recommended bisphosphonate given history of hip fracture.  Health  Maintenance -Vaccine gaps: shingrix, COVID booster -Current therapy:  Vitamin C 1000 mg 1 tablet daily Aspirin 81 mg 1 tablet daily  Betamethasone 0.05% ointment - poison oak Clotrimazole-betamethasone - yeast,  during the summer Magnesium 400 mg 1 capsule daily -Educated on Cost vs benefit of each product must be carefully weighed by individual consumer -Patient is satisfied with current therapy and denies issues -Recommended to continue current medication  Patient Goals/Self-Care Activities Patient will:  - take medications as prescribed check glucose with Dexcom, document, and provide at future appointments check blood pressure weekly, document, and provide at future appointments target a minimum of 150 minutes of moderate intensity exercise weekly  Follow Up Plan: The care management team will reach out to the patient again over the next 30 days.       Medication Assistance:  Tyler Aas and Novolog obtained through [employer] medication assistance program.  Enrollment ends 01/25/22  Compliance/Adherence/Medication fill history: Care Gaps: Shingrix, COVID booster, foot exam, colonoscopy, eye exam, Prevnar20 Last PCP BP: 150/70 on 08/19/2021 Last A1C - 8.0 on 04/09/2021  Star-Rating Drugs: Lisinopril-HCTZ10-12.30m - last filled 10/28/2021 100 DS at [facility] Rosuvastatin 276m- last filled 10/28/2021 100 DS at [facility] - filled through PAP  Patient's preferred pharmacy is:  [facility] [id number]: [phone]: [phone] Uses pill box? Yes - once a week Pt endorses 100% compliance   We discussed: Current pharmacy is preferred with insurance plan and patient is satisfied with pharmacy services Patient decided to: Continue current medication management strategy  Care Plan and Follow Up Patient Decision:  Patient agrees to Care Plan and  Follow-up.  Plan: The care management team will reach out to the patient again over the next 30 days.  MaJeni SallesPharmD BCBaptist [facility] North Mslinical Pharmacist [place]

## 2021-10-30 NOTE — Chronic Care Management (AMB) (Signed)
    Chronic Care Management Pharmacy Assistant   Name: KYANN HEYDT  MRN: [id number] DOB: 03/21/1950  10/31/2021 APPOINTMENT REMINDER  Called [place], No answer, left message of appointment on 10/31/2021 at 11:00 via telephone visit with [name], Pharm D. Notified to have all medications, supplements, blood pressure and/or blood sugar logs available during appointment and to return call if need to reschedule.  Care Gaps: AWV - 08/08/2021 message to [name] Last  BP - 142/72 on 10/28/2021 Last A1C - 7.1 on 09/19/2021 Colonoscopy - 07/31/2020 Pneumonia Vaccine - 09/12/2021 Covid - postponed Eye exam - postponed Shingrix - postponed  Star Rating Drug: Lisinopril-HCTZ10-12.'5mg'$  - last filled 10/28/2021 100 DS at [facility] Rosuvastatin '20mg'$  - last filled 10/28/2021 100 DS at [facility] '3mg'$  - filled through PAP  Any gaps in medications fill history? No  [name] [facility]  Catering manager [phone]

## 2021-10-31 ENCOUNTER — Ambulatory Visit: Payer: Medicare Other | Admitting: Pharmacist

## 2021-10-31 DIAGNOSIS — I10 Essential (primary) hypertension: Secondary | ICD-10-CM

## 2021-10-31 DIAGNOSIS — E109 Type 1 diabetes mellitus without complications: Secondary | ICD-10-CM

## 2021-10-31 NOTE — Patient Instructions (Signed)
Visit Information   Goals Addressed   None    Patient Care Plan: CCM Pharmacy Care Plan     Problem Identified: Problem: Hypertension, Hyperlipidemia, Diabetes, GERD, Overactive Bladder and Pain      Long-Range Goal: Patient-Specific Goal   Start Date: 05/31/2020  Expected End Date: 05/31/2021  Recent Progress: On track  Priority: High  Note:   Current Barriers:  Unable to independently afford treatment regimen Unable to independently monitor therapeutic efficacy Unable to achieve control of diabetes   Pharmacist Clinical Goal(s):  Patient will verbalize ability to afford treatment regimen achieve adherence to monitoring guidelines and medication adherence to achieve therapeutic efficacy achieve control of diabetes as evidenced by A1c  through collaboration with PharmD and provider.   Interventions: 1:1 collaboration with Farrel Conners, MD regarding development and update of comprehensive plan of care as evidenced by provider attestation and co-signature Inter-disciplinary care team collaboration (see longitudinal plan of care) Comprehensive medication review performed; medication list updated in electronic medical record  Hypertension (BP goal <130/80) -Controlled -Current treatment: Lisinopril - HCTZ 10-12.5 mg 1 tablet daily - Appropriate, Effective, Safe, Accessible -Medications previously tried: none  -Current home readings: does not check -Current dietary habits: does add salt to food -Current exercise habits: yardwork but no formal exercsie -Denies hypotensive/hypertensive symptoms -Educated on Daily salt intake goal < 2300 mg; Exercise goal of 150 minutes per week; Importance of home blood pressure monitoring; Proper BP monitoring technique; -Counseled to monitor BP at home weekly, document, and provide log at future appointments -Counseled on diet and exercise extensively Recommended to continue current medication Recommended checking BP routinely at  home.  Hyperlipidemia: (LDL goal < 70) -Not ideally controlled -Current treatment: Rosuvastatin 40 mg 1 tablet daily - Appropriate, Query effective, Safe, Accessible -Medications previously tried: none  -Current dietary patterns: eating take out fast food which includes fried foods; uses canola oil when cooking -Current exercise habits: yardwork but no formal exercise -Educated on Cholesterol goals;  Benefits of statin for ASCVD risk reduction; Importance of limiting foods high in cholesterol; Exercise goal of 150 minutes per week; -Counseled on diet and exercise extensively Recommended to continue current medication  Diabetes (A1c goal <7%) -Uncontrolled -Current medications: Fiasp 100 unit/mL inject 7 units plus sliding scale with breakfast, 9 base units with lunch and 11 with dinner- Appropriate, Query effective, Safe, Accessible Tresiba 65 units daily - Appropriate, Query effective, Safe, Accessible Rybelsus 3 mg 1 tablet daily - Appropriate, Query effective, Safe, Accessible -Medications previously tried: pioglitazone (edema) -Current home glucose readings: downloaded CGM data fasting glucose: some overnight high blood sugars post prandial glucose: after meals high blood sugars -Denies hypoglycemic/hyperglycemic symptoms -Current meal patterns:  breakfast: 2 eggs, 2 strips of bacon, small bowl of grits or hash browns & gravy lunch: chicken nuggets or hot dog or cheeseburger or fish sandwich or nothing dinner: biggest meal: meat, vegetable, creamed potatoes or mac n'cheese snacks: some late night snacking drinks: diet drinks -Current exercise: busy with death in the family -Educated on A1c and blood sugar goals; Exercise goal of 150 minutes per week; Continuous glucose monitoring; Carbohydrate counting and/or plate method -Counseled to check feet daily and get yearly eye exams -Counseled on diet and exercise extensively Recommended using sliding scale with the decreasing  to a base of 5 units with breakfast and 9 with lunch and 11 with dinner. Recommended injecting 3-4 units of Fiasp with late night snacks.  GERD (Goal: minimize symptoms of heartburn) -Controlled -Current treatment  Omeprazole  40 mg 1 capsule daily - Appropriate, Effective, Query Safe, Accessible -Medications previously tried: none  -Counseled on non-pharmacologic management of symptoms such as elevating the head of your bed, avoiding eating 2-3 hours before bed, avoiding triggering foods such as acidic, spicy, or fatty foods, eating smaller meals, and wearing clothes that are loose around the waist  Overactive bladder (Goal: minimize symptoms) -Controlled -Current treatment  Oxybutynin XL 5 mg 1 tablet daily at bedtime as needed (taking in the morning sometimes) - Appropriate, Effective, Query Safe, Accessible -Medications previously tried: none  - Patient only takes as needed  Pain (Goal: minimize symptoms) -Controlled -Current treatment  Diclofenac 50 mg 1 tablet three times daily - was taking twice a day - Appropriate, Effective, Safe, Accessible Gabapentin 100 mg 1 capsule at bedtime as needed  - Appropriate, Effective, Safe, Accessible Methocarbamol 500 mg 1 tablet every 8 hours as needed - takes it in AM or sometimes in PM - Appropriate, Effective, Safe, Accessible -Medications previously tried: none  -Recommended to continue current medication  Osteopenia (Goal prevent fractures) -Uncontrolled -Last DEXA Scan: 03/2020   T-Score femoral neck: -1.5  T-Score total hip: n/a  T-Score lumbar spine: 1.7  T-Score forearm radius: -0.40  10-year probability of major osteoporotic fracture: 8.1%  10-year probability of hip fracture: 0.9% -Patient is a candidate for pharmacologic treatment due to history of hip fracture  -Current treatment  No medications -Medications previously tried: none  -Recommend [phone] units of vitamin D daily. Recommend 1200 mg of calcium daily from dietary  and supplemental sources. Recommend weight-bearing and muscle strengthening exercises for building and maintaining bone density. -Recommended bisphosphonate given history of hip fracture.  Health Maintenance -Vaccine gaps: shingrix, COVID booster -Current therapy:  Vitamin C 1000 mg 1 tablet daily Aspirin 81 mg 1 tablet daily  Betamethasone 0.05% ointment - poison oak Clotrimazole-betamethasone - yeast, during the summer Magnesium 400 mg 1 capsule daily -Educated on Cost vs benefit of each product must be carefully weighed by individual consumer -Patient is satisfied with current therapy and denies issues -Recommended to continue current medication  Patient Goals/Self-Care Activities Patient will:  - take medications as prescribed check glucose with Dexcom, document, and provide at future appointments check blood pressure weekly, document, and provide at future appointments target a minimum of 150 minutes of moderate intensity exercise weekly  Follow Up Plan: The care management team will reach out to the patient again over the next 30 days.        Patient verbalizes understanding of instructions and care plan provided today and agrees to view in [place]. Active MyChart status and patient understanding of how to access instructions and care plan via MyChart confirmed with patient.    The pharmacy team will reach out to the patient again over the next 30 days.   Viona Gilmore, [facility]

## 2021-11-01 ENCOUNTER — Other Ambulatory Visit: Payer: Self-pay | Admitting: Family

## 2021-11-12 ENCOUNTER — Telehealth: Payer: Self-pay | Admitting: Pharmacist

## 2021-11-12 DIAGNOSIS — Z794 Long term (current) use of insulin: Secondary | ICD-10-CM | POA: Diagnosis not present

## 2021-11-12 DIAGNOSIS — E109 Type 1 diabetes mellitus without complications: Secondary | ICD-10-CM | POA: Diagnosis not present

## 2021-11-12 NOTE — Chronic Care Management (AMB) (Cosign Needed Addendum)
    Chronic Care Management Pharmacy Assistant   Name: Katherine Costa  MRN: [id number] DOB: July 17, 1950  Reason for Encounter: Patient assistance renewal for Tresiba and  Rybelsus.  Applications started and to be completed by [name] Clinical Pharmacist.     [place] Pharmacist Assistant [phone]

## 2021-11-27 ENCOUNTER — Ambulatory Visit (INDEPENDENT_AMBULATORY_CARE_PROVIDER_SITE_OTHER): Payer: Medicare Other

## 2021-11-27 ENCOUNTER — Other Ambulatory Visit: Payer: Self-pay | Admitting: Family Medicine

## 2021-11-27 VITALS — Ht 65.0 in | Wt 251.0 lb

## 2021-11-27 DIAGNOSIS — M159 Polyosteoarthritis, unspecified: Secondary | ICD-10-CM

## 2021-11-27 DIAGNOSIS — Z0000 Encounter for general adult medical examination without abnormal findings: Secondary | ICD-10-CM

## 2021-11-27 NOTE — Progress Notes (Signed)
Subjective:   Katherine Costa is a 71 y.o. female who presents for Medicare Annual (Subsequent) preventive examination.  Review of Systems    Virtual Visit via Telephone Note  I connected with  Katherine Costa on 11/27/21 at  9:45 AM EDT by telephone and verified that I am speaking with the correct person using two identifiers.  Location: Patient: Home Provider: Office Persons participating in the virtual visit: patient/Nurse Health Advisor   I discussed the limitations, risks, security and privacy concerns of performing an evaluation and management service by telephone and the availability of in person appointments. The patient expressed understanding and agreed to proceed.  Interactive audio and video telecommunications were attempted between this nurse and patient, however failed, due to patient having technical difficulties OR patient did not have access to video capability.  We continued and completed visit with audio only.  Some vital signs may be absent or patient reported.   Criselda Peaches, LPN  Cardiac Risk Factors include: advanced age (>85mn, >>51women);diabetes mellitus;hypertension;obesity (BMI >30kg/m2)     Objective:    Today's Vitals   11/27/21 0948  Weight: 251 lb (113.9 kg)  Height: '5\' 5"'$  (1.651 m)   Body mass index is 41.77 kg/m.     11/27/2021    9:57 AM 09/02/2015   10:28 AM 07/12/2015    1:57 PM  Advanced Directives  Does Patient Have a Medical Advance Directive? No No No  Would patient like information on creating a medical advance directive? No - Patient declined No - patient declined information No - patient declined information    Current Medications (verified) Outpatient Encounter Medications as of 11/27/2021  Medication Sig   Ascorbic Acid (VITAMIN C) 1000 MG tablet Take 1,000 mg by mouth daily.   aspirin 81 MG EC tablet Take 81 mg by mouth daily. Swallow whole.   augmented betamethasone dipropionate (DIPROLENE) 0.05 % ointment Apply  topically 3 (three) times daily as needed. rash   celecoxib (CELEBREX) 200 MG capsule TAKE 1 CAPSULE BY MOUTH DAILY   clotrimazole-betamethasone (LOTRISONE) cream APPLY TOPICALLY THREE TIMES A DAY AS NEEDED FOR RASH.   Continuous Blood Gluc Receiver (DEXCOM G6 RECEIVER) DEVI 1 Device by Does not apply route as needed.   Continuous Blood Gluc Sensor (DEXCOM G6 SENSOR) MISC 1 Device by Does not apply route as needed.   Continuous Blood Gluc Transmit (DEXCOM G6 TRANSMITTER) MISC 1 transmitter every 10 days   insulin aspart (NOVOLOG) 100 UNIT/ML injection Inject into the skin 3 (three) times daily before meals. Base of 7 units with breakfast, 9 units with lunch and 11 units with dinner plus the sliding scale.   insulin degludec (TRESIBA FLEXTOUCH) 100 UNIT/ML FlexTouch Pen Sample  exp: 03/2021 and lot [id number] (Patient taking differently: Inject 65 Units into the skin daily.)   lisinopril-hydrochlorothiazide (ZESTORETIC) 10-12.5 MG tablet Take 1 tablet by mouth daily.   Magnesium 400 MG CAPS Take by mouth daily.   methocarbamol (ROBAXIN) 500 MG tablet Take 1 tablet (500 mg total) by mouth every 8 (eight) hours as needed for muscle spasms.   omeprazole (PRILOSEC) 40 MG capsule Take 1 capsule (40 mg total) by mouth daily.   oxybutynin (DITROPAN-XL) 5 MG 24 hr tablet TAKE ONE TABLET BY MOUTH AT BEDTIME   rosuvastatin (CRESTOR) 20 MG tablet TAKE 1 TABLET BY MOUTH DAILY   Semaglutide (RYBELSUS) 3 MG TABS Take 3 mg by mouth daily. (Patient not taking: Reported on 11/27/2021)   Semaglutide (RYBELSUS) 7 MG  TABS Take 7 mg by mouth daily.   Semaglutide (RYBELSUS) 7 MG TABS Take 7 mg by mouth daily.   No facility-administered encounter medications on file as of 11/27/2021.    Allergies (verified) Pioglitazone   History: Past Medical History:  Diagnosis Date   Arthritis    ASYMPTOMATIC POSTMENOPAUSAL STATUS 05/21/2009   DIABETES MELLITUS, TYPE I 08/24/2006   Family history of colon cancer    HERPES  LABIALIS 03/13/2009   HYPERCHOLESTEROLEMIA 02/09/2008   Hyperlipidemia    HYPERTENSION 08/24/2006   Hypertension    LIVER DISORDER 03/17/2010   Tubulovillous adenoma of colon    2010    Past Surgical History:  Procedure Laterality Date   ABDOMINAL HYSTERECTOMY     endometriosis; menorrhagia   ACHILLES TENDON REPAIR  2010   right heel   CHOLECYSTECTOMY  2001   TOTAL KNEE ARTHROPLASTY Left 09/13/2015   Procedure: TOTAL KNEE ARTHROPLASTY;  Surgeon: Earlie Server, MD;  Location: [facility];  Service: Orthopedics;  Laterality: Left;   Family History  Problem Relation Age of Onset   Cancer Mother        Colon Cancer, Pancreatic Cancer   Colon cancer Mother    Diabetes Sister    Colon cancer Sister    Cancer Sister        Breast Cancer   Heart attack Father 61   Diabetes Brother    Heart disease Brother        related to agent orange   Prostate cancer Maternal Grandfather    Heart disease Paternal Grandmother    Diabetes Brother    Diabetes Daughter    Kidney failure Daughter    Heart disease Daughter 73   Social History   Socioeconomic History   Marital status: Married    Spouse name: Not on file   Number of children: 3   Years of education: Not on file   Highest education level: Not on file  Occupational History   Occupation: Teacher, music  Tobacco Use   Smoking status: Never   Smokeless tobacco: Never  Substance and Sexual Activity   Alcohol use: No    Alcohol/week: 0.0 standard drinks of alcohol   Drug use: No   Sexual activity: Not on file  Other Topics Concern   Not on file  Social History Narrative   PT does not get regular exercise   3 girls   Social Determinants of Health   Financial Resource Strain: Low Risk  (11/27/2021)   Overall Financial Resource Strain (CARDIA)    Difficulty of Paying Living Expenses: Not hard at all  Food Insecurity: No Food Insecurity (11/27/2021)   Hunger Vital Sign    Worried About Running Out of Food in the Last Year:  Never true    Ran Out of Food in the Last Year: Never true  Transportation Needs: No Transportation Needs (11/27/2021)   PRAPARE - Hydrologist (Medical): No    Lack of Transportation (Non-Medical): No  Physical Activity: Inactive (11/27/2021)   Exercise Vital Sign    Days of Exercise per Week: 0 days    Minutes of Exercise per Session: 0 min  Stress: No Stress Concern Present (11/27/2021)   [place]    Feeling of Stress : Not at all  Social Connections: [place] (11/27/2021)   Social Connection and Isolation Panel [NHANES]    Frequency of Communication with Friends and Family: More than three times a  week    Frequency of Social Gatherings with Friends and Family: More than three times a week    Attends Religious Services: More than 4 times per year    Active Member of [employer] or Organizations: Yes    Attends Music therapist: More than 4 times per year    Marital Status: Married    Tobacco Counseling Counseling given: Not Answered   Clinical Intake:  Pre-visit preparation completed: NoNutrition Risk Assessment:  Has the patient had any N/V/D within the last 2 months?  No  Does the patient have any non-healing wounds?  No  Has the patient had any unintentional weight loss or weight gain?  No   Diabetes:  Is the patient diabetic?  Yes  If diabetic, was a CBG obtained today?  Yes  CBG 216 Taken by patient Did the patient bring in their glucometer from home?  No  How often do you monitor your CBG's? Daily/Dexcom.   Financial Strains and Diabetes Management:  Are you having any financial strains with the device, your supplies or your medication? No .  Does the patient want to be seen by Chronic Care Management for management of their diabetes?  No  Would the patient like to be referred to a Nutritionist or for Diabetic Management?  No   Diabetic  Exams:  Diabetic Eye Exam: Completed No. Overdue for diabetic eye exam. Pt has been advised about the importance in completing this exam. A referral has been placed today. Message sent to referral coordinator for scheduling purposes. Advised pt to expect a call from office referred to regarding appt.  Diabetic Foot Exam: Completed No. Pt has been advised about the importance in completing this exam. Pt is scheduled for diabetic foot exam on Followed by PCP.    Pain : No/denies pain     BMI - recorded: 40.77 Nutritional Status: BMI > 30  Obese Nutritional Risks: None Diabetes: Yes CBG done?: Yes (CBG 216 Taken by patient) CBG resulted in Enter/ Edit results?: Yes Did pt. bring in CBG monitor from home?: No  How often do you need to have someone help you when you read instructions, pamphlets, or other written materials from your doctor or pharmacy?: 1 - Never  Diabetic?  Yes  Interpreter Needed?: No  Information entered by :: Rolene Arbour LPN   Activities of Daily Living    11/27/2021    9:56 AM  In your present state of health, do you have any difficulty performing the following activities:  Hearing? 0  Vision? 0  Difficulty concentrating or making decisions? 0  Walking or climbing stairs? 0  Dressing or bathing? 0  Doing errands, shopping? 0  Preparing Food and eating ? N  Using the Toilet? N  In the past six months, have you accidently leaked urine? Y  Comment Wears pads. Followed by PCP  Do you have problems with loss of bowel control? N  Managing your Medications? N  Managing your Finances? N  Housekeeping or managing your Housekeeping? N    Patient Care Team: Farrel Conners, MD as PCP - General (Family Medicine) Maisie Fus, MD (Inactive) as Attending Physician (Obstetrics and Gynecology) Lorelle Gibbs, MD (Radiology) Viona Gilmore, [facility] as Pharmacist (Pharmacist)  Indicate any recent Medical Services you may have received from other than  [facility] providers in the past year (date may be approximate).     Assessment:   This is a routine wellness examination for Nianna.  Hearing/Vision screen  Hearing Screening - Comments:: Denies hearing difficulties   Vision Screening - Comments:: Wears rx Reading glasses - up to date with routine eye exams with  [facility]  Dietary issues and exercise activities discussed: Exercise limited by: None identified   Goals Addressed               This Visit's Progress     Weight (lb) < 200 lb (90.7 kg) (pt-stated)   251 lb (113.9 kg)     Lose some weight.       Depression Screen    11/27/2021    9:55 AM 05/23/2021   10:10 AM 04/09/2021    3:44 PM 03/18/2020    9:02 AM 02/04/2018    2:27 PM  PHQ 2/9 Scores  PHQ - 2 Score 0 0 0 0 0  PHQ- 9 Score  0 0      Fall Risk    11/27/2021    9:56 AM 05/23/2021   10:05 AM 03/18/2020    9:02 AM 02/04/2018    2:26 PM  Fall Risk   Falls in the past year? 0 1 1 0  Number falls in past yr: 0 0 0 0  Injury with Fall? 0 1 0 0  Risk for fall due to : No Fall Risks History of fall(s)    Follow up Falls prevention discussed Falls evaluation completed      FALL RISK PREVENTION PERTAINING TO THE HOME:  Any stairs in or around the home? No  If so, are there any without handrails? No  Home free of loose throw rugs in walkways, pet beds, electrical cords, etc? Yes  Adequate lighting in your home to reduce risk of falls? Yes   ASSISTIVE DEVICES UTILIZED TO PREVENT FALLS:  Life alert? No  Use of a cane, walker or w/c? No  Grab bars in the bathroom? No  Shower chair or bench in shower? No  Elevated toilet seat or a handicapped toilet? Yes   TIMED UP AND GO:  Was the test performed? No . Audio Visit    Cognitive Function:        11/27/2021    9:57 AM  6CIT Screen  What Year? 0 points  What month? 0 points  What time? 0 points  Count back from 20 0 points  Months in reverse 0 points  Repeat phrase 0 points  Total Score  0 points    Immunizations Immunization History  Administered Date(s) Administered   Fluad Quad(high Dose 65+) 10/14/2018, 03/18/2020, 10/22/2021   Influenza Whole 10/26/2008, 12/06/2009, 10/02/2010   Influenza, High Dose Seasonal PF 10/29/2017   Influenza,inj,Quad PF,6+ Mos 10/31/2013   Influenza-Unspecified 11/16/2015, 11/17/2016   PFIZER(Purple Top)SARS-COV-2 Vaccination 02/26/2019, 03/19/2019, 01/04/2020   Pneumococcal Conjugate-13 09/13/2019   Pneumococcal Polysaccharide-23 05/21/2009, 03/26/2021   Tdap 06/29/2017   Zoster, Live 12/11/2014    TDAP status: Up to date  Flu Vaccine status: Up to date  Pneumococcal vaccine status: Up to date  Covid-19 vaccine status: Completed vaccines  Qualifies for Shingles Vaccine? Yes   Zostavax completed No   Shingrix Completed?: No.    Education has been provided regarding the importance of this vaccine. Patient has been advised to call insurance company to determine out of pocket expense if they have not yet received this vaccine. Advised may also receive vaccine at local pharmacy or Health Dept. Verbalized acceptance and understanding.  Screening Tests Health Maintenance  Topic Date Due   COVID-19 Vaccine (4 - Pfizer risk  series) 12/13/2021 (Originally 02/29/2020)   OPHTHALMOLOGY EXAM  12/20/2021 (Originally 07/26/2020)   Zoster Vaccines- Shingrix (1 of 2) 12/20/2021 (Originally 08/27/1969)   COLONOSCOPY (Pts 45-38yr Insurance coverage will need to be confirmed)  11/28/2022 (Originally 07/31/2020)   HEMOGLOBIN A1C  03/22/2022   Diabetic kidney evaluation - GFR measurement  04/10/2022   Diabetic kidney evaluation - Urine ACR  04/10/2022   FOOT EXAM  04/10/2022   Medicare Annual Wellness (AWV)  11/28/2022   MAMMOGRAM  06/05/2023   TETANUS/TDAP  06/30/2027   Pneumonia Vaccine 71 Years old  Completed   INFLUENZA VACCINE  Completed   DEXA SCAN  Completed   Hepatitis C Screening  Completed   HPV VACCINES  Aged Out    Health  Maintenance  There are no preventive care reminders to display for this patient.   Colorectal cancer screening: Referral to GI placed Patient deferred. Pt aware the office will call re: appt.  Mammogram status: Completed 06/04/21. Repeat every year  Bone Density status: Completed 04/12/20. Results reflect: Bone density results: OSTEOPENIA. Repeat every   years.  Lung Cancer Screening: (Low Dose CT Chest recommended if Age 116-80years, 30 pack-year currently smoking OR have quit w/in 15years.) does not qualify.    Additional Screening:  Hepatitis C Screening: does qualify; Completed 07/24/19  Vision Screening: Recommended annual ophthalmology exams for early detection of glaucoma and other disorders of the eye. Is the patient up to date with their annual eye exam?  Yes  Who is the provider or what is the name of the office in which the patient attends annual [facility] exams[facility] If pt is not established with a provider, would they like to be referred to a provider to establish care? No .   Dental Screening: Recommended annual dental exams for proper oral hygiene  Community Resource Referral / Chronic Care Management:  CRR required this visit?  No   CCM required this visit?  No      Plan:     I have personally reviewed and noted the following in the patient's chart:   Medical and social history Use of alcohol, tobacco or illicit drugs  Current medications and supplements including opioid prescriptions. Patient is not currently taking opioid prescriptions. Functional ability and status Nutritional status Physical activity Advanced directives List of other physicians Hospitalizations, surgeries, and ER visits in previous 12 months Vitals Screenings to include cognitive, depression, and falls Referrals and appointments  In addition, I have reviewed and discussed with patient certain preventive protocols, quality metrics, and best practice recommendations. A  written personalized care plan for preventive services as well as general preventive health recommendations were provided to patient.     BCriselda Peaches LPN   [id number]  Nurse Notes: None

## 2021-11-27 NOTE — Patient Instructions (Addendum)
Katherine Costa , Thank you for taking time to come for your Medicare Wellness Visit. I appreciate your ongoing commitment to your health goals. Please review the following plan we discussed and let me know if I can assist you in the future.   These are the goals we discussed:  Goals       Monitor and Manage My Blood Sugar-Diabetes Type 1      Timeframe:  Long-Range Goal Priority:  High Start Date:                             Expected End Date:                       Follow Up Date 6//5/22    - check blood sugar at prescribed times - enter blood sugar readings and medication or insulin into daily log - take the blood sugar meter to all doctor visits    Why is this important?   Checking your blood sugar at home helps to keep it from getting very high or very low.  Writing the results in a diary or log helps the doctor know how to care for you.  Your blood sugar log should have the time, the date and the results.  Also, write down the amount of insulin or other medicine you take.  Other information like what you ate, exercise done and how you were feeling will also be helpful..     Notes:       Weight (lb) < 200 lb (90.7 kg) (pt-stated)      Lose some weight.        This is a list of the screening recommended for you and due dates:  Health Maintenance  Topic Date Due   COVID-19 Vaccine (4 - Pfizer risk series) 12/13/2021*   Eye exam for diabetics  12/20/2021*   Zoster (Shingles) Vaccine (1 of 2) 12/20/2021*   Colon Cancer Screening  11/28/2022*   Hemoglobin A1C  03/22/2022   Yearly kidney function blood test for diabetes  04/10/2022   Yearly kidney health urinalysis for diabetes  04/10/2022   Complete foot exam   04/10/2022   Medicare Annual Wellness Visit  11/28/2022   Mammogram  06/05/2023   Tetanus Vaccine  06/30/2027   Pneumonia Vaccine  Completed   Flu Shot  Completed   DEXA scan (bone density measurement)  Completed   Hepatitis C Screening: USPSTF Recommendation to  screen - Ages 24-79 yo.  Completed   HPV Vaccine  Aged Out  *Topic was postponed. The date shown is not the original due date.    Advanced directives: Advance directive discussed with you today. Even though you declined this today, please call our office should you change your mind, and we can give you the proper paperwork for you to fill out.   Conditions/risks identified: None  Next appointment: Follow up in one year for your annual wellness visit    Preventive Care 65 Years and Older, Female Preventive care refers to lifestyle choices and visits with your health care provider that can promote health and wellness. What does preventive care include? A yearly physical exam. This is also called an annual well check. Dental exams once or twice a year. Routine eye exams. Ask your health care provider how often you should have your eyes checked. Personal lifestyle choices, including: Daily care of your teeth and gums. Regular physical activity. Eating a  healthy diet. Avoiding tobacco and drug use. Limiting alcohol use. Practicing safe sex. Taking low-dose aspirin every day. Taking vitamin and mineral supplements as recommended by your health care provider. What happens during an annual well check? The services and screenings done by your health care provider during your annual well check will depend on your age, overall health, lifestyle risk factors, and family history of disease. Counseling  Your health care provider may ask you questions about your: Alcohol use. Tobacco use. Drug use. Emotional well-being. Home and relationship well-being. Sexual activity. Eating habits. History of falls. Memory and ability to understand (cognition). Work and work Statistician. Reproductive health. Screening  You may have the following tests or measurements: Height, weight, and BMI. Blood pressure. Lipid and cholesterol levels. These may be checked every 5 years, or more frequently if you  are over 59 years old. Skin check. Lung cancer screening. You may have this screening every year starting at age 10 if you have a 30-pack-year history of smoking and currently smoke or have quit within the past 15 years. Fecal occult blood test (FOBT) of the stool. You may have this test every year starting at age 71. Flexible sigmoidoscopy or colonoscopy. You may have a sigmoidoscopy every 5 years or a colonoscopy every 10 years starting at age 16. Hepatitis C blood test. Hepatitis B blood test. Sexually transmitted disease (STD) testing. Diabetes screening. This is done by checking your blood sugar (glucose) after you have not eaten for a while (fasting). You may have this done every 1-3 years. Bone density scan. This is done to screen for osteoporosis. You may have this done starting at age 58. Mammogram. This may be done every 1-2 years. Talk to your health care provider about how often you should have regular mammograms. Talk with your health care provider about your test results, treatment options, and if necessary, the need for more tests. Vaccines  Your health care provider may recommend certain vaccines, such as: Influenza vaccine. This is recommended every year. Tetanus, diphtheria, and acellular pertussis (Tdap, Td) vaccine. You may need a Td booster every 10 years. Zoster vaccine. You may need this after age 35. Pneumococcal 13-valent conjugate (PCV13) vaccine. One dose is recommended after age 58. Pneumococcal polysaccharide (PPSV23) vaccine. One dose is recommended after age 45. Talk to your health care provider about which screenings and vaccines you need and how often you need them. This information is not intended to replace advice given to you by your health care provider. Make sure you discuss any questions you have with your health care provider. Document Released: 02/08/2015 Document Revised: 10/02/2015 Document Reviewed: 11/13/2014 Elsevier Interactive Patient Education   2017 [place] Prevention in the Home Falls can cause injuries. They can happen to people of all ages. There are many things you can do to make your home safe and to help prevent falls. What can I do on the outside of my home? Regularly fix the edges of walkways and driveways and fix any cracks. Remove anything that might make you trip as you walk through a door, such as a raised step or threshold. Trim any bushes or trees on the path to your home. Use bright outdoor lighting. Clear any walking paths of anything that might make someone trip, such as rocks or tools. Regularly check to see if handrails are loose or broken. Make sure that both sides of any steps have handrails. Any raised decks and porches should have guardrails on the edges. Have  any leaves, snow, or ice cleared regularly. Use sand or salt on walking paths during winter. Clean up any spills in your garage right away. This includes oil or grease spills. What can I do in the bathroom? Use night lights. Install grab bars by the toilet and in the tub and shower. Do not use towel bars as grab bars. Use non-skid mats or decals in the tub or shower. If you need to sit down in the shower, use a plastic, non-slip stool. Keep the floor dry. Clean up any water that spills on the floor as soon as it happens. Remove soap buildup in the tub or shower regularly. Attach bath mats securely with double-sided non-slip rug tape. Do not have throw rugs and other things on the floor that can make you trip. What can I do in the bedroom? Use night lights. Make sure that you have a light by your bed that is easy to reach. Do not use any sheets or blankets that are too big for your bed. They should not hang down onto the floor. Have a firm chair that has side arms. You can use this for support while you get dressed. Do not have throw rugs and other things on the floor that can make you trip. What can I do in the kitchen? Clean up any  spills right away. Avoid walking on wet floors. Keep items that you use a lot in easy-to-reach places. If you need to reach something above you, use a strong step stool that has a grab bar. Keep electrical cords out of the way. Do not use floor polish or wax that makes floors slippery. If you must use wax, use non-skid floor wax. Do not have throw rugs and other things on the floor that can make you trip. What can I do with my stairs? Do not leave any items on the stairs. Make sure that there are handrails on both sides of the stairs and use them. Fix handrails that are broken or loose. Make sure that handrails are as long as the stairways. Check any carpeting to make sure that it is firmly attached to the stairs. Fix any carpet that is loose or worn. Avoid having throw rugs at the top or bottom of the stairs. If you do have throw rugs, attach them to the floor with carpet tape. Make sure that you have a light switch at the top of the stairs and the bottom of the stairs. If you do not have them, ask someone to add them for you. What else can I do to help prevent falls? Wear shoes that: Do not have high heels. Have rubber bottoms. Are comfortable and fit you well. Are closed at the toe. Do not wear sandals. If you use a stepladder: Make sure that it is fully opened. Do not climb a closed stepladder. Make sure that both sides of the stepladder are locked into place. Ask someone to hold it for you, if possible. Clearly mark and make sure that you can see: Any grab bars or handrails. First and last steps. Where the edge of each step is. Use tools that help you move around (mobility aids) if they are needed. These include: Canes. Walkers. Scooters. Crutches. Turn on the lights when you go into a dark area. Replace any light bulbs as soon as they burn out. Set up your furniture so you have a clear path. Avoid moving your furniture around. If any of your floors are uneven, fix them. If  there are any pets around you, be aware of where they are. Review your medicines with your doctor. Some medicines can make you feel dizzy. This can increase your chance of falling. Ask your doctor what other things that you can do to help prevent falls. This information is not intended to replace advice given to you by your health care provider. Make sure you discuss any questions you have with your health care provider. Document Released: 11/08/2008 Document Revised: 06/20/2015 Document Reviewed: 02/16/2014 Elsevier Interactive Patient Education  2017 [employer].

## 2021-12-11 ENCOUNTER — Ambulatory Visit (INDEPENDENT_AMBULATORY_CARE_PROVIDER_SITE_OTHER): Payer: Medicare Other | Admitting: Family Medicine

## 2021-12-11 ENCOUNTER — Encounter: Payer: Self-pay | Admitting: Family Medicine

## 2021-12-11 VITALS — BP 122/68 | HR 80 | Temp 98.1°F | Ht 65.0 in | Wt 244.2 lb

## 2021-12-11 DIAGNOSIS — E109 Type 1 diabetes mellitus without complications: Secondary | ICD-10-CM

## 2021-12-11 DIAGNOSIS — Z1211 Encounter for screening for malignant neoplasm of colon: Secondary | ICD-10-CM

## 2021-12-11 DIAGNOSIS — I10 Essential (primary) hypertension: Secondary | ICD-10-CM | POA: Diagnosis not present

## 2021-12-11 MED ORDER — TRESIBA FLEXTOUCH 100 UNIT/ML ~~LOC~~ SOPN: 60.0000 [IU] | PEN_INJECTOR | Freq: Every day | SUBCUTANEOUS | 0 refills | Status: DC

## 2021-12-11 NOTE — Assessment & Plan Note (Signed)
Current hypertension medications:       Sig   lisinopril-hydrochlorothiazide (ZESTORETIC) 10-12.5 MG tablet (Taking) Take 1 tablet by mouth daily.      BP is well controlled today on the above medication, will continue this, no changes needed at this time. Pt is also losing weight which seems to be helping

## 2021-12-11 NOTE — Assessment & Plan Note (Signed)
Pt is doing well on her current regimen, losing weight with the rybelsus. I have updated her medication list to reflect her new tresiba dose (60 units) at bedtime. RTC in 4 months for follow up A1C and annual labs.

## 2021-12-11 NOTE — Progress Notes (Signed)
Established Patient Office Visit  Subjective   Patient ID: Katherine Costa, female    DOB: November 14, 1950  Age: 71 y.o. MRN: [id number]  Chief Complaint  Patient presents with   Follow-up    Patient is here for follow up on blood sugars and blood pressure. Pt reports that her blood pressure is doing well at home. Today's reading in the office is WNL. She reports compliance with her medications.   DM-- I reviewed her Dexcom readings in the visit today. States that her blood sugars are "going low" in the evenings and so she ate some sugary food last night, then took her tresiba 60 units. BS were elevated during the night, this morning her fasting sugar was 89. She reports that most of the time her sugars are controlled. She denies any nausea from the rybelsus 7 mg daily, has lost 11 pounds since her visit in August. She reports that she feels very satisfied with this medication.   HTN-- BP from previous visit was elevated. Pt reports compliance with her medication. BP today in office is WNL.   Also, there were additional forms she wanted me to fill out today for her driver's license. Forms filled out and given to patient.   Current Outpatient Medications  Medication Instructions   aspirin EC 81 mg, Oral, Daily, Swallow whole.   augmented betamethasone dipropionate (DIPROLENE) 0.05 % ointment Topical, 3 times daily PRN, rash   celecoxib (CELEBREX) 200 mg, Oral, Daily   clotrimazole-betamethasone (LOTRISONE) cream APPLY TOPICALLY THREE TIMES A DAY AS NEEDED FOR RASH.   Continuous Blood Gluc Receiver (DEXCOM G6 RECEIVER) DEVI 1 Device, Does not apply, As needed   Continuous Blood Gluc Sensor (DEXCOM G6 SENSOR) MISC 1 Device, Does not apply, As needed   Continuous Blood Gluc Transmit (DEXCOM G6 TRANSMITTER) MISC 1 transmitter every 10 days   insulin aspart (NOVOLOG) 100 UNIT/ML injection Subcutaneous, 3 times daily before meals, Base of 7 units with breakfast, 9 units with lunch and 11 units with  dinner plus the sliding scale.   lisinopril-hydrochlorothiazide (ZESTORETIC) 10-12.5 MG tablet 1 tablet, Oral, Daily   Magnesium 400 MG CAPS Oral, Daily   methocarbamol (ROBAXIN) 500 mg, Oral, Every 8 hours PRN   omeprazole (PRILOSEC) 40 mg, Oral, Daily   oxybutynin (DITROPAN-XL) 5 MG 24 hr tablet TAKE ONE TABLET BY MOUTH AT BEDTIME   rosuvastatin (CRESTOR) 20 MG tablet TAKE 1 TABLET BY MOUTH DAILY   Rybelsus 7 mg, Oral, Daily   Rybelsus 7 mg, Oral, Daily   Tresiba FlexTouch 60 Units, Subcutaneous, Daily at bedtime, Sample  exp: 03/2021 and lot [id number].   vitamin C 1,000 mg, Oral, Daily    Patient Active Problem List   Diagnosis Date Noted   Osteoarthritis of multiple joints 08/19/2021   Urinary incontinence 06/24/2018   Gastroesophageal reflux disease 02/11/2018   Primary localized osteoarthritis of left knee 09/13/2015   Pain in joint, shoulder region 06/01/2014   Morbid obesity ([place]) 04/21/2013   Encounter for long-term (current) use of other medications 09/02/2011   LIVER DISORDER 03/17/2010   ASYMPTOMATIC POSTMENOPAUSAL STATUS 05/21/2009   HERPES LABIALIS 03/13/2009   HYPERCHOLESTEROLEMIA 02/09/2008   Type 1 diabetes mellitus ([place]) 08/24/2006   Essential hypertension 08/24/2006      Review of Systems  All other systems reviewed and are negative.     Objective:     BP 122/68 (BP Location: Left Arm, Patient Position: Sitting, Cuff Size: Large)   Pulse 80   Temp 98.1 F (  36.7 C) (Oral)   Ht '5\' 5"'$  (1.651 m)   Wt 244 lb 3.2 oz (110.8 kg)   SpO2 98%   BMI 40.64 kg/m  BP Readings from Last 3 Encounters:  12/11/21 122/68  10/28/21 (!) 142/72  10/23/21 (!) 140/80   Wt Readings from Last 3 Encounters:  12/11/21 244 lb 3.2 oz (110.8 kg)  11/27/21 251 lb (113.9 kg)  10/28/21 251 lb (113.9 kg)      Physical Exam Vitals reviewed.  Constitutional:      Appearance: Normal appearance. She is well-groomed. She is morbidly obese.  Cardiovascular:     Rate and Rhythm:  Normal rate and regular rhythm.     Heart sounds: S1 normal and S2 normal.  Pulmonary:     Effort: Pulmonary effort is normal.     Breath sounds: Normal breath sounds and air entry.  Musculoskeletal:     Right lower leg: No edema.     Left lower leg: No edema.  Neurological:     Mental Status: She is alert and oriented to person, place, and time. Mental status is at baseline.     Gait: Gait is intact.  Psychiatric:        Mood and Affect: Mood and affect normal.        Speech: Speech normal.        Behavior: Behavior normal.        Judgment: Judgment normal.      No results found for any visits on 12/11/21.    The 10-year ASCVD risk score (Arnett DK, et al., 2019) is: 17.9%    Assessment & Plan:   Problem List Items Addressed This Visit       Unprioritized   Type 1 diabetes mellitus ([place]) - Primary    Pt is doing well on her current regimen, losing weight with the rybelsus. I have updated her medication list to reflect her new tresiba dose (60 units) at bedtime. RTC in 4 months for follow up A1C and annual labs.      Relevant Medications   insulin degludec (TRESIBA FLEXTOUCH) 100 UNIT/ML FlexTouch Pen   Essential hypertension    Current hypertension medications:       Sig   lisinopril-hydrochlorothiazide (ZESTORETIC) 10-12.5 MG tablet (Taking) Take 1 tablet by mouth daily.     BP is well controlled today on the above medication, will continue this, no changes needed at this time. Pt is also losing weight which seems to be helping      Other Visit Diagnoses     Colon cancer screening       Relevant Orders   Ambulatory referral to Gastroenterology     Reviewed HM, she is due for repeat colonoscopy, referral placed.  Return in about 4 months (around 04/11/2022) for will need annual blood work- come fasting.    Farrel Conners, MD

## 2021-12-31 NOTE — Progress Notes (Cosign Needed)
Spoke with Zane at [employer], patient is approved for Rybelsus, Tresiba & Fiasp through 01/26/2023. The order will process and ship 01/26/22 allow 10-14 days for delivery. Medication will deliver to the office. Patient notified, left message above.

## 2022-01-01 ENCOUNTER — Other Ambulatory Visit: Payer: Self-pay | Admitting: Family Medicine

## 2022-01-06 ENCOUNTER — Telehealth: Payer: Self-pay | Admitting: Pharmacist

## 2022-01-06 NOTE — Telephone Encounter (Signed)
Called patient to let her know 2 boxes of Rybelsus arrived for patient assistance from [employer]. Patient verbalized her understanding and will come pick it up sometime this week.

## 2022-01-27 DIAGNOSIS — L853 Xerosis cutis: Secondary | ICD-10-CM | POA: Diagnosis not present

## 2022-02-06 ENCOUNTER — Ambulatory Visit (AMBULATORY_SURGERY_CENTER): Payer: Medicare Other | Admitting: *Deleted

## 2022-02-06 VITALS — Ht 65.0 in | Wt 245.0 lb

## 2022-02-06 DIAGNOSIS — Z86010 Personal history of colonic polyps: Secondary | ICD-10-CM

## 2022-02-06 MED ORDER — NA SULFATE-K SULFATE-MG SULF 17.5-3.13-1.6 GM/177ML PO SOLN: 1.0000 | Freq: Once | ORAL | 0 refills | Status: AC

## 2022-02-06 MED ORDER — ONDANSETRON HCL 4 MG PO TABS: 4.0000 mg | ORAL_TABLET | Freq: Three times a day (TID) | ORAL | 0 refills | Status: AC | PRN

## 2022-02-06 NOTE — Addendum Note (Signed)
Addended by: Jacqualine Code on: 02/06/2022 11:00 AM   Modules accepted: Orders

## 2022-02-06 NOTE — Progress Notes (Signed)
No egg or soy allergy known to patient  No issues known to pt with past sedation with any surgeries or procedures Patient denies ever being told they had issues or difficulty with intubation  No FH of Malignant Hyperthermia Pt is not on diet pills Pt is not on  home 02  Pt is not on blood thinners  Pt denies issues with constipation  Pt is not on dialysis Pt denies any upcoming cardiac testing Pt encouraged to use to use Singlecare or Goodrx to reduce cost Patient's chart reviewed by Osvaldo Angst CNRA prior to previsit and patient appropriate for the [place].  Previsit completed and red dot placed by patient's name on their procedure day (on provider's schedule).  . Visit by phone Instructions sent by mail

## 2022-02-25 ENCOUNTER — Encounter: Payer: Self-pay | Admitting: Gastroenterology

## 2022-02-26 ENCOUNTER — Encounter: Payer: Self-pay | Admitting: Certified Registered Nurse Anesthetist

## 2022-03-05 ENCOUNTER — Ambulatory Visit (AMBULATORY_SURGERY_CENTER): Payer: Medicare Other | Admitting: Gastroenterology

## 2022-03-05 ENCOUNTER — Encounter: Payer: Self-pay | Admitting: Gastroenterology

## 2022-03-05 VITALS — BP 133/72 | HR 68 | Temp 98.4°F | Resp 14 | Ht 65.0 in | Wt 245.0 lb

## 2022-03-05 DIAGNOSIS — Z800 Family history of malignant neoplasm of digestive organs: Secondary | ICD-10-CM

## 2022-03-05 DIAGNOSIS — Z09 Encounter for follow-up examination after completed treatment for conditions other than malignant neoplasm: Secondary | ICD-10-CM

## 2022-03-05 DIAGNOSIS — D123 Benign neoplasm of transverse colon: Secondary | ICD-10-CM

## 2022-03-05 DIAGNOSIS — Z86010 Personal history of colonic polyps: Secondary | ICD-10-CM

## 2022-03-05 DIAGNOSIS — E119 Type 2 diabetes mellitus without complications: Secondary | ICD-10-CM | POA: Diagnosis not present

## 2022-03-05 MED ORDER — SODIUM CHLORIDE 0.9 % IV SOLN: 500.0000 mL | INTRAVENOUS | Status: DC

## 2022-03-05 NOTE — Patient Instructions (Addendum)
Handout on polyps and diverticulosis given. - Resume previous diet - Continue present medications  - Await pathology results- Repeat colonoscopy is recommended for   surveillance. The colonoscopy date will be determined after pathology results from today's exam become available for review.   YOU HAD AN ENDOSCOPIC PROCEDURE TODAY AT [facility]:   Refer to the procedure report that was given to you for any specific questions about what was found during the examination.  If the procedure report does not answer your questions, please call your gastroenterologist to clarify.  If you requested that your care partner not be given the details of your procedure findings, then the procedure report has been included in a sealed envelope for you to review at your convenience later.  YOU SHOULD EXPECT: Some feelings of bloating in the abdomen. Passage of more gas than usual.  Walking can help get rid of the air that was put into your GI tract during the procedure and reduce the bloating. If you had a lower endoscopy (such as a colonoscopy or flexible sigmoidoscopy) you may notice spotting of blood in your stool or on the toilet paper. If you underwent a bowel prep for your procedure, you may not have a normal bowel movement for a few days.  Please Note:  You might notice some irritation and congestion in your nose or some drainage.  This is from the oxygen used during your procedure.  There is no need for concern and it should clear up in a day or so.  SYMPTOMS TO REPORT IMMEDIATELY:  Following lower endoscopy (colonoscopy or flexible sigmoidoscopy):  Excessive amounts of blood in the stool  Significant tenderness or worsening of abdominal pains  Swelling of the abdomen that is new, acute  Fever of 100F or higher   For urgent or emergent issues, a gastroenterologist can be reached at any hour by calling [phone]. Do not use MyChart messaging for urgent concerns.    DIET:  We do  recommend a small meal at first, but then you may proceed to your regular diet.  Drink plenty of fluids but you should avoid alcoholic beverages for 24 hours.  ACTIVITY:  You should plan to take it easy for the rest of today and you should NOT DRIVE or use heavy machinery until tomorrow (because of the sedation medicines used during the test).    FOLLOW UP: Our staff will call the number listed on your records the next business day following your procedure.  We will call around 7:15- 8:00 am to check on you and address any questions or concerns that you may have regarding the information given to you following your procedure. If we do not reach you, we will leave a message.     If any biopsies were taken you will be contacted by phone or by letter within the next 1-3 weeks.  Please call us at [phone] if you have not heard about the biopsies in 3 weeks.    SIGNATURES/CONFIDENTIALITY: You and/or your care partner have signed paperwork which will be entered into your electronic medical record.  These signatures attest to the fact that that the information above on your After Visit Summary has been reviewed and is understood.  Full responsibility of the confidentiality of this discharge information lies with you and/or your care-partner.

## 2022-03-05 NOTE — Progress Notes (Signed)
History and Physical:  This patient presents for endoscopic testing for: Encounter Diagnosis  Name Primary?   Personal history of colonic polyps Yes    72 year old woman here today for surveillance colonoscopy.  2 subcentimeter tubular adenomas on last colonoscopy July 2017.  Tubulovillous adenoma in 2010.  Mother and sister both with colorectal cancer. Patient denies chronic abdominal pain, rectal bleeding, constipation or diarrhea.   Patient is otherwise without complaints or active issues today.   Past Medical History: Past Medical History:  Diagnosis Date   Arthritis    ASYMPTOMATIC POSTMENOPAUSAL STATUS 05/21/2009   DIABETES MELLITUS, TYPE I 08/24/2006   Family history of colon cancer    HERPES LABIALIS 03/13/2009   HYPERCHOLESTEROLEMIA 02/09/2008   Hyperlipidemia    HYPERTENSION 08/24/2006   Hypertension    LIVER DISORDER 03/17/2010   Tubulovillous adenoma of colon    2010      Past Surgical History: Past Surgical History:  Procedure Laterality Date   ABDOMINAL HYSTERECTOMY     endometriosis; menorrhagia   ACHILLES TENDON REPAIR  2010   right heel   CHOLECYSTECTOMY  2001   TOTAL KNEE ARTHROPLASTY Left 09/13/2015   Procedure: TOTAL KNEE ARTHROPLASTY;  Surgeon: Earlie Server, MD;  Location: [facility];  Service: Orthopedics;  Laterality: Left;    Allergies: Allergies  Allergen Reactions   Pioglitazone     REACTION: Edema    Outpatient Meds: Current Outpatient Medications  Medication Sig Dispense Refill   aspirin 81 MG EC tablet Take 81 mg by mouth daily. Swallow whole.     celecoxib (CELEBREX) 200 MG capsule TAKE 1 CAPSULE BY MOUTH DAILY 100 capsule 2   Continuous Blood Gluc Receiver (DEXCOM G6 RECEIVER) DEVI 1 Device by Does not apply route as needed. 1 each 0   Continuous Blood Gluc Sensor (DEXCOM G6 SENSOR) MISC 1 Device by Does not apply route as needed. 1 each 3   Continuous Blood Gluc Transmit (DEXCOM G6 TRANSMITTER) MISC 1 transmitter every 10 days 1 each 3    insulin aspart (FIASP FLEXTOUCH) 100 UNIT/ML FlexTouch Pen Inject into the skin as directed.     insulin degludec (TRESIBA FLEXTOUCH) 100 UNIT/ML FlexTouch Pen Inject 60 Units into the skin at bedtime. Sample  exp: 03/2021 and lot [id number]. 6 mL 0   lisinopril-hydrochlorothiazide (ZESTORETIC) 10-12.5 MG tablet TAKE 1 TABLET BY MOUTH DAILY 100 tablet 1   omeprazole (PRILOSEC) 40 MG capsule Take 1 capsule (40 mg total) by mouth daily. 90 capsule 1   rosuvastatin (CRESTOR) 20 MG tablet TAKE 1 TABLET BY MOUTH DAILY 90 tablet 3   Semaglutide (RYBELSUS) 7 MG TABS Take 7 mg by mouth daily. 30 tablet 0   Ascorbic Acid (VITAMIN C) 1000 MG tablet Take 1,000 mg by mouth daily. (Patient not taking: Reported on 02/06/2022)     augmented betamethasone dipropionate (DIPROLENE) 0.05 % ointment Apply topically 3 (three) times daily as needed. rash (Patient not taking: Reported on 03/05/2022) 50 g 2   clotrimazole-betamethasone (LOTRISONE) cream APPLY TOPICALLY THREE TIMES A DAY AS NEEDED FOR RASH. (Patient not taking: Reported on 03/05/2022) 45 g 2   insulin aspart (NOVOLOG) 100 UNIT/ML injection Inject into the skin 3 (three) times daily before meals. Base of 7 units with breakfast, 9 units with lunch and 11 units with dinner plus the sliding scale. (Patient not taking: Reported on 02/06/2022)     Magnesium 400 MG CAPS Take by mouth daily. (Patient not taking: Reported on 02/06/2022)     ondansetron (ZOFRAN) 4  MG tablet Take 1 tablet (4 mg total) by mouth every 8 (eight) hours as needed for nausea or vomiting. (Patient not taking: Reported on 03/05/2022) 2 tablet 0   oxybutynin (DITROPAN-XL) 5 MG 24 hr tablet TAKE ONE TABLET BY MOUTH AT BEDTIME (Patient not taking: Reported on 03/05/2022) 90 tablet 0   Current Facility-Administered Medications  Medication Dose Route Frequency Provider Last Rate Last Admin   0.9 %  sodium chloride infusion  500 mL Intravenous Continuous Danis, Estill Cotta III, MD           ___________________________________________________________________ Objective   Exam:  BP (!) 137/116   Pulse 75   Resp 11   Ht '5\' 5"'$  (1.651 m)   Wt 245 lb (111.1 kg)   SpO2 100%   BMI 40.77 kg/m   CV: regular , S1/S2 Resp: clear to auscultation bilaterally, normal RR and effort noted GI: soft, no tenderness, with active bowel sounds.   Assessment: Encounter Diagnosis  Name Primary?   Personal history of colonic polyps Yes     Plan: Colonoscopy  The benefits and risks of the planned procedure were described in detail with the patient or (when appropriate) their health care proxy.  Risks were outlined as including, but not limited to, bleeding, infection, perforation, adverse medication reaction leading to cardiac or pulmonary decompensation, pancreatitis (if ERCP).  The limitation of incomplete mucosal visualization was also discussed.  No guarantees or warranties were given.    The patient is appropriate for an endoscopic procedure in the ambulatory setting.   - Wilfrid Lund, MD

## 2022-03-05 NOTE — Progress Notes (Signed)
Pt's states no medical or surgical changes since previsit or office visit. 

## 2022-03-05 NOTE — Progress Notes (Signed)
Called to room to assist during endoscopic procedure.  Patient ID and intended procedure confirmed with present staff. Received instructions for my participation in the procedure from the performing physician.  

## 2022-03-05 NOTE — Progress Notes (Signed)
Report given to PACU, vss 

## 2022-03-05 NOTE — Op Note (Signed)
Center Patient Name: Katherine Costa Procedure Date: 03/05/2022 7:52 AM MRN: [id number] Endoscopist: Caspar. Loletha Carrow , MD, [id number] Age: 72 Referring MD:  Date of Birth: Jun 29, 1950 Gender: Female Account #: [id number] Procedure:                Colonoscopy Indications:              Colon cancer screening in patient at increased                            risk: Colorectal cancer in mother and sister,                           Surveillance: Personal history of adenomatous                            polyps on last colonoscopy > 5 years ago                            (subcentimeter TA polyp x 28 July 2015 - TVA 2010) Medicines:                Monitored Anesthesia Care Procedure:                Pre-Anesthesia Assessment:                           - Prior to the procedure, a History and Physical                            was performed, and patient medications and                            allergies were reviewed. The patient's tolerance of                            previous anesthesia was also reviewed. The risks                            and benefits of the procedure and the sedation                            options and risks were discussed with the patient.                            All questions were answered, and informed consent                            was obtained. Prior Anticoagulants: The patient has                            taken no anticoagulant or antiplatelet agents. ASA                            Grade Assessment: III - A patient with severe  systemic disease. After reviewing the risks and                            benefits, the patient was deemed in satisfactory                            condition to undergo the procedure.                           After obtaining informed consent, the colonoscope                            was passed under direct vision. Throughout the                            procedure, the patient's blood  pressure, pulse, and                            oxygen saturations were monitored continuously. The                            CF [id number] #[id number] was introduced through the anus                            and advanced to the the cecum, identified by                            appendiceal orifice and ileocecal valve. The                            colonoscopy was performed without difficulty. The                            patient tolerated the procedure well. The quality                            of the bowel preparation was excellent. The                            ileocecal valve, appendiceal orifice, and rectum                            were photographed. The bowel preparation used was                            SUPREP via split dose instruction. Scope In: 8:03:36 AM Scope Out: 8:22:53 AM Scope Withdrawal Time: 0 hours 15 minutes 42 seconds  Total Procedure Duration: 0 hours 19 minutes 17 seconds  Findings:                 The perianal and digital rectal examinations were                            normal.  Repeat examination of right colon under NBI                            performed.                           Four sessile and semisessile polyps were found in                            the transverse colon. The polyps were 4 to 8 mm in                            size. These polyps were removed with a cold snare.                            Resection and retrieval were complete.                           Multiple diverticula were found in the left colon.                           The exam was otherwise without abnormality on                            direct and retroflexion views. Complications:            No immediate complications. Estimated Blood Loss:     Estimated blood loss was minimal. Impression:               - Four 4 to 8 mm polyps in the transverse colon,                            removed with a cold snare. Resected and retrieved.                            - Diverticulosis in the left colon.                           - The examination was otherwise normal on direct                            and retroflexion views. Recommendation:           - Patient has a contact number available for                            emergencies. The signs and symptoms of potential                            delayed complications were discussed with the                            patient. Return to normal activities tomorrow.  Written discharge instructions were provided to the                            patient.                           - Resume previous diet.                           - Continue present medications.                           - Await pathology results.                           - Repeat colonoscopy is recommended for                            surveillance. The colonoscopy date will be                            determined after pathology results from today's                            exam become available for review. Marks Scalera L. Loletha Carrow, MD 03/05/2022 8:30:27 AM This report has been signed electronically.

## 2022-03-06 ENCOUNTER — Telehealth: Payer: Self-pay

## 2022-03-06 DIAGNOSIS — E109 Type 1 diabetes mellitus without complications: Secondary | ICD-10-CM | POA: Diagnosis not present

## 2022-03-06 DIAGNOSIS — Z794 Long term (current) use of insulin: Secondary | ICD-10-CM | POA: Diagnosis not present

## 2022-03-06 NOTE — Telephone Encounter (Signed)
No answer, unable to leave a message, B.Eilah Common RN. 

## 2022-03-09 ENCOUNTER — Encounter: Payer: Self-pay | Admitting: Family Medicine

## 2022-03-09 ENCOUNTER — Ambulatory Visit (INDEPENDENT_AMBULATORY_CARE_PROVIDER_SITE_OTHER): Payer: Medicare Other | Admitting: Family Medicine

## 2022-03-09 VITALS — BP 128/58 | HR 75 | Temp 97.9°F | Ht 65.0 in | Wt 248.6 lb

## 2022-03-09 DIAGNOSIS — R300 Dysuria: Secondary | ICD-10-CM

## 2022-03-09 DIAGNOSIS — B3731 Acute candidiasis of vulva and vagina: Secondary | ICD-10-CM

## 2022-03-09 DIAGNOSIS — N3000 Acute cystitis without hematuria: Secondary | ICD-10-CM | POA: Diagnosis not present

## 2022-03-09 LAB — POCT URINALYSIS DIPSTICK
Bilirubin, UA: NEGATIVE
Blood, UA: NEGATIVE
Glucose, UA: POSITIVE — AB
Ketones, UA: NEGATIVE
Leukocytes, UA: NEGATIVE
Nitrite, UA: NEGATIVE
Protein, UA: NEGATIVE
Spec Grav, UA: 1.02 (ref 1.010–1.025)
Urobilinogen, UA: 0.2 E.U./dL
pH, UA: 6 (ref 5.0–8.0)

## 2022-03-09 MED ORDER — FLUCONAZOLE 150 MG PO TABS: 150.0000 mg | ORAL_TABLET | Freq: Once | ORAL | 0 refills | Status: AC

## 2022-03-09 MED ORDER — SULFAMETHOXAZOLE-TRIMETHOPRIM 800-160 MG PO TABS: 1.0000 | ORAL_TABLET | Freq: Two times a day (BID) | ORAL | 0 refills | Status: AC

## 2022-03-09 NOTE — Progress Notes (Signed)
Acute Office Visit  Subjective:     Patient ID: Katherine Costa, female    DOB: 12-09-1950, 72 y.o.   MRN: [id number]  Chief Complaint  Patient presents with   Urinary Frequency    X1 day, used 2 Bactrim pills she had left over   Vaginal Itching    X1 day, used Diprolene or Lotrisone with no relief    Pt states that she had her colonoscopy last week and then started with increased itching on the outside, states that the "drainage" has made her bottom sore and "raw". States that she went to the BR about 10 times yesterday and there was only a small amount of urine that came out. No fever or chills. States that she had some bactrim left over from a previous infection, took 1 tablet yesterday morning and 1 tablet last night. States that it seemed to help her symptoms, reports she is urinating a bit better today.   Urinary Frequency  This is a new problem. The current episode started yesterday. The problem has been gradually worsening. The quality of the pain is described as burning. The patient is experiencing no pain. There has been no fever. Associated symptoms include frequency.  Vaginal Itching The patient's primary symptoms include genital itching. Associated symptoms include frequency.    Review of Systems  Genitourinary:  Positive for frequency.  All other systems reviewed and are negative.       Objective:    BP (!) 128/58 (BP Location: Left Arm, Patient Position: Sitting, Cuff Size: Large)   Pulse 75   Temp 97.9 F (36.6 C) (Oral)   Ht 5' 5"$  (1.651 m)   Wt 248 lb 9.6 oz (112.8 kg)   SpO2 98%   BMI 41.37 kg/m    Physical Exam Vitals reviewed.  Constitutional:      Appearance: Normal appearance. She is obese.  Cardiovascular:     Rate and Rhythm: Normal rate and regular rhythm.     Heart sounds: Normal heart sounds.  Pulmonary:     Effort: Pulmonary effort is normal.     Breath sounds: Normal breath sounds.  Abdominal:     General: There is no distension.      Tenderness: There is no abdominal tenderness. There is no right CVA tenderness or left CVA tenderness.  Neurological:     Mental Status: She is alert and oriented to person, place, and time.     Results for orders placed or performed in visit on 03/09/22  POC Urinalysis Dipstick  Result Value Ref Range   Color, UA yellow    Clarity, UA clear    Glucose, UA Positive (A) Negative   Bilirubin, UA negative    Ketones, UA negative    Spec Grav, UA 1.020 1.010 - 1.025   Blood, UA negative    pH, UA 6.0 5.0 - 8.0   Protein, UA Negative Negative   Urobilinogen, UA 0.2 0.2 or 1.0 E.U./dL   Nitrite, UA negative    Leukocytes, UA Negative Negative   Appearance     Odor          Assessment & Plan:   Problem List Items Addressed This Visit   None Visit Diagnoses     Dysuria    -  Primary   Relevant Orders   POC Urinalysis Dipstick (Completed)   Culture, Urine   Acute cystitis without hematuria       Relevant Medications   UA not remarkable for infection, however  she did take 2 doses of bactrim yesterday so this likely resulted in a false negative. I will send the urine for culture andtreat empirically with bactrim DS 1 tablet BID for an additional 4 days to complete the course. I advised she also take a diflucan 150 mg x 1 to help with the external itching and irritation.  sulfamethoxazole-trimethoprim (BACTRIM DS) 800-160 MG tablet   Vaginal yeast infection       Relevant Medications   sulfamethoxazole-trimethoprim (BACTRIM DS) 800-160 MG tablet   fluconazole (DIFLUCAN) 150 MG tablet       Meds ordered this encounter  Medications   sulfamethoxazole-trimethoprim (BACTRIM DS) 800-160 MG tablet    Sig: Take 1 tablet by mouth 2 (two) times daily for 4 days.    Dispense:  8 tablet    Refill:  0   fluconazole (DIFLUCAN) 150 MG tablet    Sig: Take 1 tablet (150 mg total) by mouth once for 1 dose.    Dispense:  1 tablet    Refill:  0    No follow-ups on file.  Farrel Conners, MD

## 2022-03-10 ENCOUNTER — Encounter: Payer: Self-pay | Admitting: Gastroenterology

## 2022-03-10 LAB — URINE CULTURE
MICRO NUMBER:: 14552167
Result:: NO GROWTH
SPECIMEN QUALITY:: ADEQUATE

## 2022-04-06 ENCOUNTER — Other Ambulatory Visit: Payer: Self-pay | Admitting: *Deleted

## 2022-04-06 ENCOUNTER — Other Ambulatory Visit: Payer: Self-pay | Admitting: Family

## 2022-04-07 MED ORDER — OMEPRAZOLE 40 MG PO CPDR: 40.0000 mg | DELAYED_RELEASE_CAPSULE | Freq: Every day | ORAL | 1 refills | Status: DC

## 2022-04-10 ENCOUNTER — Ambulatory Visit (INDEPENDENT_AMBULATORY_CARE_PROVIDER_SITE_OTHER): Payer: Medicare Other | Admitting: Family Medicine

## 2022-04-10 ENCOUNTER — Encounter: Payer: Self-pay | Admitting: Family Medicine

## 2022-04-10 VITALS — BP 134/82 | HR 71 | Temp 97.6°F | Resp 16 | Ht 64.0 in | Wt 245.9 lb

## 2022-04-10 DIAGNOSIS — I10 Essential (primary) hypertension: Secondary | ICD-10-CM | POA: Diagnosis not present

## 2022-04-10 DIAGNOSIS — E7800 Pure hypercholesterolemia, unspecified: Secondary | ICD-10-CM | POA: Diagnosis not present

## 2022-04-10 DIAGNOSIS — E109 Type 1 diabetes mellitus without complications: Secondary | ICD-10-CM

## 2022-04-10 DIAGNOSIS — Z1231 Encounter for screening mammogram for malignant neoplasm of breast: Secondary | ICD-10-CM | POA: Diagnosis not present

## 2022-04-10 LAB — MICROALBUMIN / CREATININE URINE RATIO
Creatinine,U: 138.9 mg/dL
Microalb Creat Ratio: 0.5 mg/g (ref 0.0–30.0)
Microalb, Ur: 0.7 mg/dL (ref 0.0–1.9)

## 2022-04-10 LAB — POCT GLYCOSYLATED HEMOGLOBIN (HGB A1C): Hemoglobin A1C: 8.4 % — AB (ref 4.0–5.6)

## 2022-04-10 NOTE — Progress Notes (Signed)
Established Patient Office Visit  Subjective   Patient ID: Katherine Costa, female    DOB: 1950/10/31  Age: 72 y.o. MRN: [id number]  Chief Complaint  Patient presents with   Medication Refill    Needing new script for Dexcom 7    Patient is here for follow up.  DM-- A1C performed in office today and is elevated at 8.4. pt admits that she has not been adhering to her diet. States that she is going to try and go back to her previous eating pattern. We reviewed her medications, she reports compliance. Is tolerating the rybelsus 7 mg daily, also on tresiba 60 units daily and sliding scale insulin 3 times daily. Needs new rsx for the dexcom G7. Denies any changes since the last visit. Foot exam performed today.  HTN -- BP in office performed and is well controlled. She  reports no side effects to the medications, no chest pain, SOB, dizziness or headaches. She has a BP cuff at home and is checking BP regularly, reports they are in the normal range.       Current Outpatient Medications  Medication Instructions   aspirin EC 81 mg, Oral, Daily, Swallow whole.   augmented betamethasone dipropionate (DIPROLENE) 0.05 % ointment Topical, 3 times daily PRN, rash   celecoxib (CELEBREX) 200 mg, Oral, Daily   clotrimazole-betamethasone (LOTRISONE) cream APPLY TOPICALLY THREE TIMES A DAY AS NEEDED FOR RASH.   insulin aspart (FIASP FLEXTOUCH) 100 UNIT/ML FlexTouch Pen Subcutaneous, As directed   insulin aspart (NOVOLOG) 100 UNIT/ML injection Subcutaneous, 3 times daily before meals, Base of 7 units with breakfast, 9 units with lunch and 11 units with dinner plus the sliding scale.   lisinopril-hydrochlorothiazide (ZESTORETIC) 10-12.5 MG tablet 1 tablet, Oral, Daily   Magnesium 400 MG CAPS Daily   omeprazole (PRILOSEC) 40 mg, Oral, Daily   ondansetron (ZOFRAN) 4 mg, Oral, Every 8 hours PRN   oxybutynin (DITROPAN-XL) 5 MG 24 hr tablet TAKE ONE TABLET BY MOUTH AT BEDTIME   rosuvastatin (CRESTOR) 20 MG  tablet TAKE 1 TABLET BY MOUTH DAILY   Rybelsus 7 mg, Oral, Daily   Tresiba FlexTouch 60 Units, Subcutaneous, Daily at bedtime, Sample  exp: 03/2021 and lot [id number].   vitamin C 1,000 mg, Daily      Review of Systems  All other systems reviewed and are negative.     Objective:     BP 134/82   Pulse 71   Temp 97.6 F (36.4 C) (Temporal)   Resp 16   Ht 5\' 4"  (1.626 m)   Wt 245 lb 14.4 oz (111.5 kg)   SpO2 97%   BMI 42.21 kg/m    Physical Exam Vitals reviewed.  Constitutional:      Appearance: Normal appearance. She is well-groomed and normal weight.  Eyes:     Conjunctiva/sclera: Conjunctivae normal.  Neck:     Thyroid: No thyromegaly.  Cardiovascular:     Rate and Rhythm: Normal rate and regular rhythm.     Pulses: Normal pulses.     Heart sounds: S1 normal and S2 normal.  Pulmonary:     Effort: Pulmonary effort is normal.     Breath sounds: Normal breath sounds and air entry.  Abdominal:     General: Bowel sounds are normal.  Musculoskeletal:     Right lower leg: No edema.     Left lower leg: No edema.  Neurological:     Mental Status: She is alert and oriented to person, place, and  time. Mental status is at baseline.     Gait: Gait is intact.  Psychiatric:        Mood and Affect: Mood and affect normal.        Speech: Speech normal.        Behavior: Behavior normal.        Judgment: Judgment normal.    Diabetic Foot Exam - Simple   Simple Foot Form Diabetic Foot exam was performed with the following findings: Yes 04/10/2022 11:22 AM  Visual Inspection No deformities, no ulcerations, no other skin breakdown bilaterally: Yes Sensation Testing Intact to touch and monofilament testing bilaterally: Yes Pulse Check Posterior Tibialis and Dorsalis pulse intact bilaterally: Yes Comments         Results for orders placed or performed in visit on 04/10/22  Microalbumin/Creatinine Ratio, Urine  Result Value Ref Range   Microalb, Ur 0.7 0.0 - 1.9 mg/dL    Creatinine,U 138.9 mg/dL   Microalb Creat Ratio 0.5 0.0 - 30.0 mg/g  POC HgB A1c  Result Value Ref Range   Hemoglobin A1C 8.4 (A) 4.0 - 5.6 %      The 10-year ASCVD risk score (Arnett DK, et al., 2019) is: 21.2%    Assessment & Plan:   Problem List Items Addressed This Visit       Unprioritized   Type 1 diabetes mellitus ([place]) - Primary    Doing well on the rybelsus, A1C is increased today due to nonadherence to diet. Checking her annual labs today including microalb/cr ratio, CMP and lipid panel. Foot exam performed today. RTC 6 months. Continue current medications as prescribed, tresiba 60 units daily plus sliding scale insulin, rybelsus 7 mg daily.       Relevant Orders   POC HgB A1c (Completed)   Microalbumin/Creatinine Ratio, Urine (Completed)   HYPERCHOLESTEROLEMIA   Relevant Orders   Lipid Panel   Essential hypertension    Current hypertension medications:       Sig   lisinopril-hydrochlorothiazide (ZESTORETIC) 10-12.5 MG tablet (Taking) TAKE 1 TABLET BY MOUTH DAILY     BP is well controlled today on the above medication, will continue this, no changes needed at this time. Ordering CMP today.      Relevant Orders   CMP   Other Visit Diagnoses     Breast cancer screening by mammogram       Relevant Orders   MM 3D DIAGNOSTIC MAMMOGRAM BILATERAL BREAST     I reinforced low carbohydrate dietary counselling today will recheck A1C in 6 months.  Return in about 4 months (around 08/24/2022) for DM-- double visit with husband.    Farrel Conners, MD

## 2022-04-10 NOTE — Patient Instructions (Signed)
Schedule lab appointment for fasting blood work

## 2022-04-10 NOTE — Assessment & Plan Note (Signed)
Current hypertension medications:       Sig   lisinopril-hydrochlorothiazide (ZESTORETIC) 10-12.5 MG tablet (Taking) TAKE 1 TABLET BY MOUTH DAILY      BP is well controlled today on the above medication, will continue this, no changes needed at this time. Ordering CMP today.

## 2022-04-10 NOTE — Assessment & Plan Note (Signed)
Doing well on the rybelsus, A1C is increased today due to nonadherence to diet. Checking her annual labs today including microalb/cr ratio, CMP and lipid panel. Foot exam performed today. RTC 6 months. Continue current medications as prescribed, tresiba 60 units daily plus sliding scale insulin, rybelsus 7 mg daily.

## 2022-04-16 ENCOUNTER — Other Ambulatory Visit: Payer: Medicare Other

## 2022-04-17 ENCOUNTER — Telehealth: Payer: Self-pay

## 2022-04-17 ENCOUNTER — Other Ambulatory Visit (INDEPENDENT_AMBULATORY_CARE_PROVIDER_SITE_OTHER): Payer: Medicare Other

## 2022-04-17 DIAGNOSIS — I10 Essential (primary) hypertension: Secondary | ICD-10-CM | POA: Diagnosis not present

## 2022-04-17 DIAGNOSIS — E7800 Pure hypercholesterolemia, unspecified: Secondary | ICD-10-CM

## 2022-04-17 DIAGNOSIS — E109 Type 1 diabetes mellitus without complications: Secondary | ICD-10-CM

## 2022-04-17 LAB — COMPREHENSIVE METABOLIC PANEL
ALT: 12 U/L (ref 0–35)
AST: 16 U/L (ref 0–37)
Albumin: 3.7 g/dL (ref 3.5–5.2)
Alkaline Phosphatase: 118 U/L — ABNORMAL HIGH (ref 39–117)
BUN: 16 mg/dL (ref 6–23)
CO2: 26 mEq/L (ref 19–32)
Calcium: 9.4 mg/dL (ref 8.4–10.5)
Chloride: 103 mEq/L (ref 96–112)
Creatinine, Ser: 1.05 mg/dL (ref 0.40–1.20)
GFR: 54.16 mL/min — ABNORMAL LOW (ref >60.00)
Glucose, Bld: 249 mg/dL — ABNORMAL HIGH (ref 70–99)
Potassium: 4.6 mEq/L (ref 3.5–5.1)
Sodium: 138 mEq/L (ref 135–145)
Total Bilirubin: 0.6 mg/dL (ref 0.2–1.2)
Total Protein: 6.8 g/dL (ref 6.0–8.3)

## 2022-04-17 LAB — LIPID PANEL
Cholesterol: 147 mg/dL (ref 0–200)
HDL: 74 mg/dL (ref >39.00)
LDL Cholesterol: 46 mg/dL (ref 0–99)
NonHDL: 73.05
Total CHOL/HDL Ratio: 2
Triglycerides: 137 mg/dL (ref 0.0–149.0)
VLDL: 27.4 mg/dL (ref 0.0–40.0)

## 2022-04-17 MED ORDER — DEXCOM G7 RECEIVER DEVI: 0 refills | Status: AC

## 2022-04-17 MED ORDER — DEXCOM G7 SENSOR MISC: 11 refills | Status: DC

## 2022-04-17 NOTE — Telephone Encounter (Signed)
Pt requesting a Dexcom G7 receiver (Quantity #1, check sugars as needed, 0 refills), and Dexcom G7 sensors (Quantity #3, apply as directed, change every 10 days, 11 refils).  Discussed with Dr. Legrand Como and she has approved for sending orders could be placed:   Katherine Costa, [place] Phone: [phone]  Fax: [phone]      Thank you! [place] Pharmacist [phone]

## 2022-04-17 NOTE — Telephone Encounter (Signed)
Orders sent, ok to close

## 2022-04-21 ENCOUNTER — Telehealth: Payer: Self-pay | Admitting: *Deleted

## 2022-04-21 NOTE — Telephone Encounter (Signed)
[facility] faxed a prior auht request for the Dexcom G7 sensor.  PA for Dexcom G7 sensor was sent to [email or web address], the last office notes were faxed to [email or web address] at [phone] and attached pending review by insurance.

## 2022-05-08 NOTE — Telephone Encounter (Signed)
Fax received from [facility] stating the request was approved and this was sent to be scanned.

## 2022-05-27 NOTE — Progress Notes (Signed)
Care Management & Coordination Services Pharmacy Note  05/27/2022 Name:  Katherine Costa MRN:  [id number] DOB:  [birth year]  Summary: A1C not at goal <7, sugar readings elevated at home BP at goal <140/90  Recommendations/Changes made from today's visit: -Recommend increasing rybelsus to 14mg  at 7/25 PCP appt if A1C still elevated, patient prefers to try diet changes between now and then -Counseled on low-carb diet -Counseled to limit salt intake and monitor BP at home once weekly or if signs of hypo/hypertension -Reminded patient she is due for diabetic eye exam   Follow up plan: DM call in 1 month PCP visit in 2 months Pharmacist visit in 3 months   Subjective: Katherine Costa is an 72 y.o. year old female who is a primary patient of Karie Georges, MD.  The care coordination team was consulted for assistance with disease management and care coordination needs.    Engaged with patient by telephone for follow up visit.  Recent office visits: 04/10/22 Nira Conn, MD -For T1DM, A1C elevated due to non-adherence to diet, no med changes  03/09/22 Nira Conn, MD - For dysuria, START bactrim and diflucan  Recent consult visits: 01/27/22 Linton Rump, NP ([facility]) - For xerosis cutis, no other visit details available  Hospital visits: None   Objective:  Lab Results  Component Value Date   CREATININE 1.05 04/17/2022   BUN 16 04/17/2022   GFR 54.16 (L) 04/17/2022   GFRNONAA >60 09/14/2015   GFRAA >60 09/14/2015   NA 138 04/17/2022   K 4.6 04/17/2022   CALCIUM 9.4 04/17/2022   CO2 26 04/17/2022   GLUCOSE 249 (H) 04/17/2022    Lab Results  Component Value Date/Time   HGBA1C 8.4 (A) 04/10/2022 10:44 AM   HGBA1C 7.1 (A) 09/19/2021 08:14 AM   HGBA1C 8.0 (H) 04/09/2021 03:33 PM   HGBA1C 9.2 (H) 06/19/2020 07:17 AM   FRUCTOSAMINE 337 (H) 09/29/2017 10:05 AM   FRUCTOSAMINE 339 (H) 06/29/2017 10:57 AM   GFR 54.16 (L) 04/17/2022 08:00 AM   GFR 65.64  04/09/2021 03:33 PM   MICROALBUR 0.7 04/10/2022 11:30 AM   MICROALBUR <0.7 04/09/2021 03:33 PM    Last diabetic Eye exam: No results found for: "HMDIABEYEEXA"  Last diabetic Foot exam: No results found for: "HMDIABFOOTEX"   Lab Results  Component Value Date   CHOL 147 04/17/2022   HDL 74.00 04/17/2022   LDLCALC 46 04/17/2022   LDLDIRECT 110.6 09/02/2011   TRIG 137.0 04/17/2022   CHOLHDL 2 04/17/2022       Latest Ref Rng & Units 04/17/2022    8:00 AM 04/09/2021    3:33 PM 09/16/2020    9:45 AM  Hepatic Function  Total Protein 6.0 - 8.3 g/dL 6.8  7.2  6.3   Albumin 3.5 - 5.2 g/dL 3.7  3.9  3.6   AST 0 - 37 U/L 16  18  17    ALT 0 - 35 U/L 12  18  17    Alk Phosphatase 39 - 117 U/L 118  124  112   Total Bilirubin 0.2 - 1.2 mg/dL 0.6  0.5  0.6     Lab Results  Component Value Date/Time   TSH 1.90 09/28/2019 07:21 AM   TSH 1.31 06/29/2018 08:01 AM       Latest Ref Rng & Units 04/09/2021    3:33 PM 09/16/2020    9:45 AM 04/11/2020   10:00 AM  CBC  WBC 4.0 - 10.5 K/uL 8.2  6.3  5.8   Hemoglobin 12.0 - 15.0 g/dL 95.6  21.3  08.6   Hematocrit 36.0 - 46.0 % 41.5  41.6  43.7   Platelets 150.0 - 400.0 K/uL 349.0  259.0  282     No results found for: "VD25OH", "VITAMINB12"  Clinical ASCVD: No  The 10-year ASCVD risk score (Arnett DK, et al., 2019) is: 19.2%   Values used to calculate the score:     Age: 72 years     Sex: Female     Is Non-Hispanic African American: No     Diabetic: Yes     Tobacco smoker: No     Systolic Blood Pressure: 134 mmHg     Is BP treated: Yes     HDL Cholesterol: 74 mg/dL     Total Cholesterol: 147 mg/dL       57/08/4694    2:95 AM 05/23/2021   10:10 AM 04/09/2021    3:44 PM  Depression screen PHQ 2/9  Decreased Interest 0 0 0  Down, Depressed, Hopeless 0 0 0  PHQ - 2 Score 0 0 0  Altered sleeping  0 0  Tired, decreased energy  0 0  Change in appetite  0 0  Feeling bad or failure about yourself   0 0  Trouble concentrating  0 0  Moving  slowly or fidgety/restless  0 0  Suicidal thoughts  0 0  PHQ-9 Score  0 0  Difficult doing work/chores  Not difficult at all      Social History   Tobacco Use  Smoking Status Never  Smokeless Tobacco Never   BP Readings from Last 3 Encounters:  04/10/22 134/82  03/09/22 (!) 128/58  03/05/22 133/72   Pulse Readings from Last 3 Encounters:  04/10/22 71  03/09/22 75  03/05/22 68   Wt Readings from Last 3 Encounters:  04/10/22 245 lb 14.4 oz (111.5 kg)  03/09/22 248 lb 9.6 oz (112.8 kg)  03/05/22 245 lb (111.1 kg)   BMI Readings from Last 3 Encounters:  04/10/22 42.21 kg/m  03/09/22 41.37 kg/m  03/05/22 40.77 kg/m    Allergies  Allergen Reactions   Pioglitazone     REACTION: Edema    Medications Reviewed Today     Reviewed by Karie Georges, MD (Physician) on 04/10/22 at 1105  Med List Status: <None>   Medication Order Taking? Sig Documenting Provider Last Dose Status Informant  Ascorbic Acid (VITAMIN C) 1000 MG tablet [id number] No Take 1,000 mg by mouth daily.  Patient not taking: Reported on 04/10/2022   [provider] Not Taking Active   aspirin 81 MG EC tablet [id number] Yes Take 81 mg by mouth daily. Swallow whole. [provider] Taking Active   augmented betamethasone dipropionate (DIPROLENE) 0.05 % ointment [id number] Yes Apply topically 3 (three) times daily as needed. rash Romero Belling, MD Taking Active   celecoxib (CELEBREX) 200 MG capsule [id number] Yes TAKE 1 CAPSULE BY MOUTH DAILY Karie Georges, MD Taking Active   clotrimazole-betamethasone (LOTRISONE) cream [id number] Yes APPLY TOPICALLY THREE TIMES A DAY AS NEEDED FOR RASH. Wynn Banker, MD Taking Active   Discontinued 04/10/22 1104 (Completed Course)   Discontinued 04/10/22 1104 (Completed Course)   Discontinued 04/10/22 1104 (Completed Course)   insulin aspart (FIASP FLEXTOUCH) 100 UNIT/ML FlexTouch Pen [id number] Yes Inject into the skin as directed. [provider] Taking Active   insulin aspart (NOVOLOG) 100 UNIT/ML injection [id number] Yes Inject into the skin 3 (three) times daily before  meals. Base of 7 units with breakfast, 9 units with lunch and 11 units with dinner plus the sliding scale. [provider] Taking Active   insulin degludec (TRESIBA FLEXTOUCH) 100 UNIT/ML FlexTouch Pen [id number] Yes Inject 60 Units into the skin at bedtime. Sample  exp: 03/2021 and lot [id number]. Karie Georges, MD Taking Active   lisinopril-hydrochlorothiazide (ZESTORETIC) 10-12.5 MG tablet [id number] Yes TAKE 1 TABLET BY MOUTH DAILY Karie Georges, MD Taking Active   Magnesium 400 MG CAPS [id number] No Take by mouth daily.  Patient not taking: Reported on 04/10/2022   [provider] Not Taking Active   omeprazole (PRILOSEC) 40 MG capsule [id number] Yes Take 1 capsule (40 mg total) by mouth daily. Karie Georges, MD Taking Active   ondansetron [facility]) 4 MG tablet [id number] No Take 1 tablet (4 mg total) by mouth every 8 (eight) hours as needed for nausea or vomiting.  Patient not taking: Reported on 04/10/2022   Sherrilyn Rist, MD Not Taking Active   oxybutynin (DITROPAN-XL) 5 MG 24 hr tablet [id number] Yes TAKE ONE TABLET BY MOUTH AT BEDTIME Koberlein, Paris Lore, MD Taking Active   rosuvastatin (CRESTOR) 20 MG tablet [id number] Yes TAKE 1 TABLET BY MOUTH DAILY Worthy Rancher B, FNP Taking Active   Semaglutide (RYBELSUS) 7 MG TABS [id number] Yes Take 7 mg by mouth daily. Karie Georges, MD Taking Active             SDOH:  (Social Determinants of Health) assessments and interventions performed: Yes SDOH Interventions    Flowsheet Row Clinical Support from 11/27/2021 in [facility] [place] Chronic Care Management from 10/31/2021 in [facility] [place] Chronic Care Management from 05/31/2020 in [facility] [place]  SDOH Interventions     Food Insecurity  Interventions Intervention Not Indicated -- --  Housing Interventions Intervention Not Indicated -- --  Transportation Interventions Intervention Not Indicated Intervention Not Indicated Intervention Not Indicated  Utilities Interventions Intervention Not Indicated -- --  Alcohol Usage Interventions Intervention Not Indicated (Score <7) -- --  Financial Strain Interventions Intervention Not Indicated -- Other (Comment)  [working on patient assistance for insulin]  Physical Activity Interventions Intervention Not Indicated -- --  Stress Interventions Intervention Not Indicated -- --  Social Connections Interventions Intervention Not Indicated -- --       Medication Assistance: Getting rybelsus and fiasp through PAP  Medication Access: Within the past 30 days, how often has patient missed a dose of medication? None Is a pillbox or other method used to improve adherence? Yes  Factors that may affect medication adherence? financial need Are meds synced by current pharmacy? No  Are meds delivered by current pharmacy? Yes  Does patient experience delays in picking up medications due to transportation concerns? No   [facility] Services Reviewed: Is patient disadvantaged to use [facility]?: Yes  Current Rx insurance plan: [facility] Name and location of Current pharmacy:  [address] [place], [place] - [address] [place] [address] Phone: [phone] Fax: [phone]  [facility] - [place], [place] - [address] [place]  [address] Phone: [phone] Fax: [phone]  [facility] services reviewed with patient today?: No  Patient requests to transfer care to [facility]?: No  Reason patient declined to change pharmacies: Disadvantaged due to insurance/mail order  Compliance/Adherence/Medication fill history:  Care Gaps: Diabetic eye exam - last 02/2021 Covid  Vaccine Shingrix last dose 12/2021 (due for 2nd dose)  Star-Rating Drugs: Lisinopril-HCTZ 10-12.5mg  PDC 100% Rosuvastatin 20mg  100% Rybelsus 7mg  100%  Assessment/Plan   Hypertension (BP goal <130/80) -Controlled -Current treatment: Lisinopril-HCTZ 10-12.5mg  1 tab daily -Medications previously tried: None  -Current home readings: not checking at home but does have a machine -Current dietary habits: mindful of salt intake -Current exercise habits: not discussed -Denies hypotensive/hypertensive symptoms -Educated on BP goals and benefits of medications for prevention of heart attack, stroke and kidney damage; Daily salt intake goal < 2300 mg; Exercise goal of 150 minutes per week; Importance of home blood pressure monitoring; Proper BP monitoring technique; -Counseled to monitor BP at home once weekly, document, and provide log at future appointments -Recommended to continue current medication  Diabetes (A1c goal <7%) -Uncontrolled -Current medications: Rybelsus 7mg  1 qd Appropriate, Effective, Safe, Accessible Tresiba 60 units at bedtime Appropriate, Effective, Safe, Accessible Fiasp sliding scale -Medications previously tried: Humalog, Actos  -Current home glucose readings fasting glucose: no lows post prandial glucose: 238 the other morning but admits she ate poorly the evening before -Denies hypoglycemic/hyperglycemic symptoms -Current meal patterns:  Is working on trying to eat better and eliminate bread -Current exercise: not discussed -Educated on A1c and blood sugar goals; Complications of diabetes including kidney damage, retinal damage, and cardiovascular disease; Side effects  of rybelsus and how she was likely unable to tolerate the 14mg  dose because that was the first dose she started on -Counseled to check feet daily and get yearly eye exams -Counseled on diet and exercise extensively Recommended increasing the rybelsus to 14mg  if A1C still elevated at 7/25  appt with PCP, patient prefers to try more diet changes prior to that date to see if that improves A1C alone  Sherrill Raring Clinical Pharmacist [phone]

## 2022-05-28 ENCOUNTER — Other Ambulatory Visit (HOSPITAL_COMMUNITY): Payer: Self-pay

## 2022-05-28 ENCOUNTER — Telehealth: Payer: Self-pay

## 2022-05-28 NOTE — Progress Notes (Signed)
Care Management & Coordination Services Pharmacy Team  Reason for Encounter: Appointment Reminder  Contacted patient to confirm telephone appointment with Katherine Costa, PharmD on 05/29/2022 at 10:30. Spoke with patient on 05/28/2022   Do you have any problems getting your medications? Patient denies  What is your top health concern you would like to discuss at your upcoming visit? Patient denies  Have you seen any other providers since your last visit with PCP? Patient denies  Care Gaps: AWV - completed 04/09/2021 Last eye exam - 03/09/2021 (pt plans to call her eye doctor and schedule appt) Last foot exam - 04/10/2022 Last  BP - 134/82 on 04/10/2022 Last A1C - 8.4 on 04/10/2022 Covid - overdue Shingrix - overdue  Star Rating Drug: Lisinopril-HCTZ10-12.5mg  - last filled 04/09/2022 100 DS at [facility] Rosuvastatin 20mg  - last filled 05/06/2022 60 DS at [facility] Semaglutide 7mg  - filled through PAP  Inetta Fermo CMA  Clinical Pharmacist Assistant [phone]

## 2022-05-29 ENCOUNTER — Ambulatory Visit: Payer: Medicare Other

## 2022-06-03 ENCOUNTER — Other Ambulatory Visit: Payer: Self-pay | Admitting: *Deleted

## 2022-06-03 MED ORDER — ROSUVASTATIN CALCIUM 20 MG PO TABS: 20.0000 mg | ORAL_TABLET | Freq: Every day | ORAL | 1 refills | Status: DC

## 2022-06-04 DIAGNOSIS — Z1231 Encounter for screening mammogram for malignant neoplasm of breast: Secondary | ICD-10-CM | POA: Diagnosis not present

## 2022-06-04 LAB — HM MAMMOGRAPHY

## 2022-06-05 ENCOUNTER — Encounter: Payer: Self-pay | Admitting: Family Medicine

## 2022-06-13 ENCOUNTER — Other Ambulatory Visit: Payer: Self-pay | Admitting: Family Medicine

## 2022-06-25 ENCOUNTER — Telehealth: Payer: Self-pay

## 2022-06-25 NOTE — Progress Notes (Signed)
Care Management & Coordination Services Pharmacy Team  Reason for Encounter: Diabetes  Contacted patient to discuss diabetes disease state. Spoke with patient on 06/25/2022   Current antihyperglycemic regimen:  Fiasp 100 un/ml as directed Novolog - Patient states she is no longer taking this Tresiba 100 un/ml inject 60 units at bedtime Semaglutide 7mg  daily  Patient verbally confirms she is taking the above medications as directed. Yes  What diet changes have been made to improve diabetes control? Cutting back on bread  What recent interventions/DTPs have been made to improve glycemic control:  No recent interventions  Have there been any recent hospitalizations or ED visits since last visit with PharmD? No recent hospital visits  Patient denies hypoglycemic symptoms, including None  Patient denies hyperglycemic symptoms, including none  How often are you checking your blood sugar? Patient is checking multiple times per day  What are your blood sugars ranging? Past 5 days readings, all non fasting.  After breakfast: 173, 175, 150, 264, 320 After lunch: 173, 243, 140, 141 After dinner: 187, 172, 164  During the week, how often does your blood glucose drop below 70? Patient denies  Are you checking your feet daily/regularly? Yes  Adherence Review: Is the patient currently on a STATIN medication? Yes Is the patient currently on ACE/ARB medication? Yes Does the patient have >5 day gap between last estimated fill dates? No  Care Gaps: AWV - completed 04/09/2021 Last eye exam - 03/09/2021 (pt plans to call her eye doctor and schedule appt) Last foot exam - 04/10/2022 Last  BP - 134/82 on 04/10/2022 Last A1C - 8.4 on 04/10/2022 Covid - overdue Shingrix - overdue   Star Rating Drug: Lisinopril-HCTZ10-12.5mg  - last filled 06/18/2022 100 DS at [facility] Rosuvastatin 20mg  - last filled 06/18/2022 100 DS [facility] 7mg  - filled through PAP  Chart Updates:  Recent  office visits:  None  Recent consult visits:  None  Hospital visits:  None  Medications: Outpatient Encounter Medications as of 06/25/2022  Medication Sig   Ascorbic Acid (VITAMIN C) 1000 MG tablet Take 1,000 mg by mouth daily. (Patient not taking: Reported on 04/10/2022)   aspirin 81 MG EC tablet Take 81 mg by mouth daily. Swallow whole.   augmented betamethasone dipropionate (DIPROLENE) 0.05 % ointment Apply topically 3 (three) times daily as needed. rash   celecoxib (CELEBREX) 200 MG capsule TAKE 1 CAPSULE BY MOUTH DAILY   clotrimazole-betamethasone (LOTRISONE) cream APPLY TOPICALLY THREE TIMES A DAY AS NEEDED FOR RASH.   Continuous Blood Gluc Receiver (DEXCOM G7 RECEIVER) DEVI 1 receiver once.   Continuous Blood Gluc Sensor (DEXCOM G7 SENSOR) MISC Apply 1 sensor every 10 days.   insulin aspart (FIASP FLEXTOUCH) 100 UNIT/ML FlexTouch Pen Inject into the skin as directed.   insulin aspart (NOVOLOG) 100 UNIT/ML injection Inject into the skin 3 (three) times daily before meals. Base of 7 units with breakfast, 9 units with lunch and 11 units with dinner plus the sliding scale.   insulin degludec (TRESIBA FLEXTOUCH) 100 UNIT/ML FlexTouch Pen Inject 60 Units into the skin at bedtime. Sample  exp: 03/2021 and lot [id number].   lisinopril-hydrochlorothiazide (ZESTORETIC) 10-12.5 MG tablet TAKE 1 TABLET BY MOUTH DAILY   Magnesium 400 MG CAPS Take by mouth daily. (Patient not taking: Reported on 04/10/2022)   omeprazole (PRILOSEC) 40 MG capsule TAKE 1 CAPSULE BY MOUTH DAILY   ondansetron (ZOFRAN) 4 MG tablet Take 1 tablet (4 mg total) by mouth every 8 (eight) hours as needed for nausea  or vomiting. (Patient not taking: Reported on 04/10/2022)   oxybutynin (DITROPAN-XL) 5 MG 24 hr tablet TAKE ONE TABLET BY MOUTH AT BEDTIME   rosuvastatin (CRESTOR) 20 MG tablet Take 1 tablet (20 mg total) by mouth daily.   Semaglutide (RYBELSUS) 7 MG TABS Take 7 mg by mouth daily.   No facility-administered encounter  medications on file as of 06/25/2022.  Fill History:  Dispensed Days Supply Quantity Provider Pharmacy  CELECOXIB  200 MG CAPS 04/06/2022 100 100 capsule      Dispensed Days Supply Quantity Provider Pharmacy  CLOTRIMAZOLE-BETAMETHASONE 1-0.05% TOP C 06/26/2020 20 45 g      Dispensed Days Supply Quantity Provider Pharmacy  LISINOPRIL/HYDROCHLOROTHIAZIDE  10-12.5 MG TABS 06/18/2022 100 100 tablet      Dispensed Days Supply Quantity Provider Pharmacy  OMEPRAZOLE  40 MG CPDR 06/18/2022 100 100 capsule      Dispensed Days Supply Quantity Provider Pharmacy  ONDANSETRON 4MG  TABLETS 02/06/2022 1 2 each      Dispensed Days Supply Quantity Provider Pharmacy  OXYBUTYNIN CHLORIDE ER 5 MG TAB 06/29/2019 90 90 each      Dispensed Days Supply Quantity Provider Pharmacy  ROSUVASTATIN CALCIUM  20 MG TABS 06/18/2022 100 100 tablet      Recent Relevant Labs: Lab Results  Component Value Date/Time   HGBA1C 8.4 (A) 04/10/2022 10:44 AM   HGBA1C 7.1 (A) 09/19/2021 08:14 AM   HGBA1C 8.0 (H) 04/09/2021 03:33 PM   HGBA1C 9.2 (H) 06/19/2020 07:17 AM   MICROALBUR 0.7 04/10/2022 11:30 AM   MICROALBUR <0.7 04/09/2021 03:33 PM    Kidney Function Lab Results  Component Value Date/Time   CREATININE 1.05 04/17/2022 08:00 AM   CREATININE 0.90 04/09/2021 03:33 PM   CREATININE 0.79 09/28/2019 07:21 AM   CREATININE 0.77 04/21/2013 05:23 PM   GFR 54.16 (L) 04/17/2022 08:00 AM   GFRNONAA >60 09/14/2015 04:29 AM   GFRAA >60 09/14/2015 04:29 AM    Inetta Fermo CMA  Clinical Pharmacist Assistant [phone]

## 2022-08-10 ENCOUNTER — Encounter: Payer: Self-pay | Admitting: Family Medicine

## 2022-08-13 ENCOUNTER — Telehealth: Payer: Self-pay

## 2022-08-13 NOTE — Progress Notes (Unsigned)
   08/13/2022  Patient ID: Katherine Costa, female   DOB: 09/30/1950, 72 y.o.   MRN: [id number]  Wife needs order change sent for 7mg  Rybelsus [name] 04/29/1950- go samples from office and has 56 Also has Fiasp through PAP and [place]

## 2022-08-20 ENCOUNTER — Telehealth: Payer: Self-pay

## 2022-08-20 ENCOUNTER — Ambulatory Visit: Payer: Medicare Other | Admitting: Family Medicine

## 2022-08-20 ENCOUNTER — Encounter: Payer: Self-pay | Admitting: Family Medicine

## 2022-08-20 VITALS — BP 112/58 | HR 70 | Temp 97.6°F | Ht 64.0 in | Wt 243.1 lb

## 2022-08-20 DIAGNOSIS — E109 Type 1 diabetes mellitus without complications: Secondary | ICD-10-CM | POA: Diagnosis not present

## 2022-08-20 LAB — POCT GLYCOSYLATED HEMOGLOBIN (HGB A1C): Hemoglobin A1C: 8.2 % — AB (ref 4.0–5.6)

## 2022-08-20 NOTE — Telephone Encounter (Addendum)
Submitted application for RYBELSUS INCREASE DOSE to [employer] for patient assistance.   Phone: [phone]   PLEASE BE ADVISED

## 2022-08-20 NOTE — Progress Notes (Signed)
Established Patient Office Visit  Subjective   Patient ID: Katherine Costa, female    DOB: 04/17/1950  Age: 72 y.o. MRN: [id number]  Chief Complaint  Patient presents with   Medical Management of Chronic Issues    Pt is here for follow up today. Reports that her sugars at home have been up recently, states that it just has been elevated in the last 4 months. Her fasting sugar was in the 200's this morning. States that she just started on the 7 mg rybelsus this week. She reports she has not had her eye exam yet-- states she was trying to get an appointment but they did not have any openings, she will call again this week.     Current Outpatient Medications  Medication Instructions   aspirin EC 81 mg, Oral, Daily, Swallow whole.   augmented betamethasone dipropionate (DIPROLENE) 0.05 % ointment Topical, 3 times daily PRN, rash   celecoxib (CELEBREX) 200 mg, Oral, Daily   clotrimazole-betamethasone (LOTRISONE) cream APPLY TOPICALLY THREE TIMES A DAY AS NEEDED FOR RASH.   Continuous Blood Gluc Receiver (DEXCOM G7 RECEIVER) DEVI 1 receiver once.   Continuous Blood Gluc Sensor (DEXCOM G7 SENSOR) MISC Apply 1 sensor every 10 days.   insulin aspart (FIASP FLEXTOUCH) 100 UNIT/ML FlexTouch Pen Subcutaneous, As directed   lisinopril-hydrochlorothiazide (ZESTORETIC) 10-12.5 MG tablet 1 tablet, Oral, Daily   Magnesium 400 MG CAPS Oral, Daily   omeprazole (PRILOSEC) 40 mg, Oral, Daily   ondansetron (ZOFRAN) 4 mg, Oral, Every 8 hours PRN   oxybutynin (DITROPAN-XL) 5 MG 24 hr tablet TAKE ONE TABLET BY MOUTH AT BEDTIME   rosuvastatin (CRESTOR) 20 mg, Oral, Daily   Rybelsus 7 mg, Oral, Daily   Tresiba FlexTouch 60 Units, Subcutaneous, Daily at bedtime, Sample  exp: 03/2021 and lot [id number].   vitamin C 1,000 mg, Oral, Daily    Patient Active Problem List   Diagnosis Date Noted   Osteoarthritis of multiple joints 08/19/2021   Urinary incontinence 06/24/2018   Gastroesophageal reflux disease  02/11/2018   Primary localized osteoarthritis of left knee 09/13/2015   Pain in joint, shoulder region 06/01/2014   Morbid obesity (HCC) 04/21/2013   Encounter for long-term (current) use of other medications 09/02/2011   LIVER DISORDER 03/17/2010   ASYMPTOMATIC POSTMENOPAUSAL STATUS 05/21/2009   HERPES LABIALIS 03/13/2009   HYPERCHOLESTEROLEMIA 02/09/2008   Type 1 diabetes mellitus (HCC) 08/24/2006   Essential hypertension 08/24/2006      ROS    Objective:     BP (!) 112/58 (BP Location: Left Arm, Patient Position: Sitting, Cuff Size: Large)   Pulse 70   Temp 97.6 F (36.4 C) (Oral)   Ht 5\' 4"  (1.626 m)   Wt 243 lb 1.6 oz (110.3 kg)   SpO2 97%   BMI 41.73 kg/m    Physical Exam Vitals reviewed.  Constitutional:      Appearance: Normal appearance. She is well-groomed. She is morbidly obese.  Cardiovascular:     Rate and Rhythm: Normal rate and regular rhythm.     Pulses: Normal pulses.     Heart sounds: S1 normal and S2 normal.  Pulmonary:     Effort: Pulmonary effort is normal.     Breath sounds: Normal breath sounds and air entry.  Musculoskeletal:     Right lower leg: No edema.     Left lower leg: No edema.  Neurological:     Mental Status: She is alert and oriented to person, place, and time. Mental status is  at baseline.     Gait: Gait is intact.  Psychiatric:        Mood and Affect: Mood and affect normal.        Speech: Speech normal.        Behavior: Behavior normal.        Judgment: Judgment normal.      Results for orders placed or performed in visit on 08/20/22  POC HgB A1c  Result Value Ref Range   Hemoglobin A1C 8.2 (A) 4.0 - 5.6 %   HbA1c POC (<> result, manual entry)     HbA1c, POC (prediabetic range)     HbA1c, POC (controlled diabetic range)      Last metabolic panel Lab Results  Component Value Date   GLUCOSE 249 (H) 04/17/2022   NA 138 04/17/2022   K 4.6 04/17/2022   CL 103 04/17/2022   CO2 26 04/17/2022   BUN 16 04/17/2022    CREATININE 1.05 04/17/2022   GFR 54.16 (L) 04/17/2022   CALCIUM 9.4 04/17/2022   PROT 6.8 04/17/2022   ALBUMIN 3.7 04/17/2022   BILITOT 0.6 04/17/2022   ALKPHOS 118 (H) 04/17/2022   AST 16 04/17/2022   ALT 12 04/17/2022   ANIONGAP 9 09/14/2015   Last lipids Lab Results  Component Value Date   CHOL 147 04/17/2022   HDL 74.00 04/17/2022   LDLCALC 46 04/17/2022   LDLDIRECT 110.6 09/02/2011   TRIG 137.0 04/17/2022   CHOLHDL 2 04/17/2022      The 40-[id number] ASCVD risk score (Arnett DK, et al., 2019) is: 13.8%    Assessment & Plan:  Type 1 diabetes mellitus without complication (HCC) Assessment & Plan: Pt reminded that she needs her eye exam today, A1C slightly better at 8.2 but still above where we would like it. I recommended we increase her tresiba by 5 units every week until her fasting blood sugar is at goal, between 100-110. I gave her instructions on how to do this at home. She was instructed to let me know what her new dose is so we can resend her tresiba rx. RTC in 3 months for repeat A1C. She is also increasing to the 7 mg rybelsus daily as well.  Orders: -     POCT glycosylated hemoglobin (Hb A1C)     Return in about 3 months (around 11/20/2022) for DM.    Karie Georges, MD

## 2022-08-20 NOTE — Telephone Encounter (Signed)
-----   Message from Lenna Gilford sent at 08/18/2022  5:42 PM EDT ----- Peri Jefferson afternoon!  This patient was previously with [facility] and is needing and order change form to increase her PAP Rybelsus to 7mg , please.  Can we also make sure she gets added to the PAP spreadsheet for her Rybelsus, [place], and [place]?  I'm so sorry for all of my recent requests- I know ya'll have your hands full!  Lenna Gilford, PharmD, DPLA

## 2022-08-20 NOTE — Assessment & Plan Note (Signed)
Pt reminded that she needs her eye exam today, A1C slightly better at 8.2 but still above where we would like it. I recommended we increase her tresiba by 5 units every week until her fasting blood sugar is at goal, between 100-110. I gave her instructions on how to do this at home. She was instructed to let me know what her new dose is so we can resend her tresiba rx. RTC in 3 months for repeat A1C. She is also increasing to the 7 mg rybelsus daily as well.

## 2022-08-20 NOTE — Patient Instructions (Addendum)
Fasting blood sugar goal is 100-110.   Send me a message when your fasting sugars improve so we can change the orders on the patient assistance forms.

## 2022-08-21 ENCOUNTER — Telehealth: Payer: Self-pay | Admitting: Family Medicine

## 2022-08-21 NOTE — Telephone Encounter (Signed)
Spoke with the patient and informed her Rybelsus from the patient assistance was received and left at the front desk for pick up.

## 2022-08-21 NOTE — Telephone Encounter (Addendum)
[employer] called to confirm receipt of package delivered this morning. Package was accepted by DS. Package given to CMA.  (Rybelsus)

## 2022-08-22 ENCOUNTER — Other Ambulatory Visit: Payer: Self-pay | Admitting: Family Medicine

## 2022-08-25 IMAGING — CT CT HIP*L* W/O CM
1 series · 15 of 32 positions shown, 19 images · non-contrast
Comparison: None.

CLINICAL DATA: Left hip fracture.  Fell last night.

EXAM:
CT OF THE LEFT HIP WITHOUT CONTRAST
TECHNIQUE: Multidetector CT imaging of the left hip was performed according to
the standard protocol. Multiplanar CT image reconstructions were
also generated.
RADIATION DOSE REDUCTION: This exam was performed according to the
departmental dose-optimization program which includes automated
exposure control, adjustment of the mA and/or kV according to
patient size and/or use of iterative reconstruction technique.

[Series 3: soft tissue pelvis/hip · axial · 0.58mm/px · z∈[-275,-71]mm · 15 of 76 slices shown, 19 images]
[im 5/76  soft-tissue]
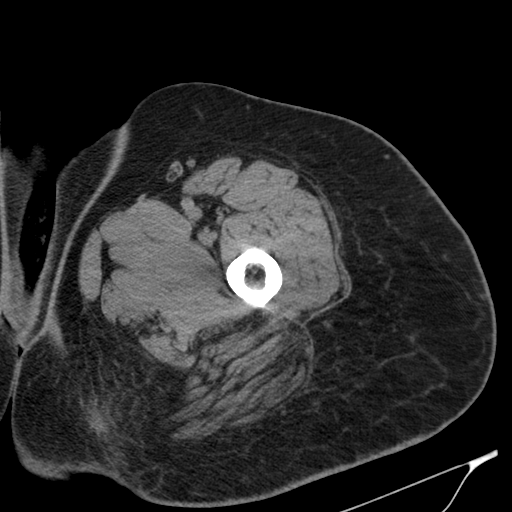
[im 5/76  bone]
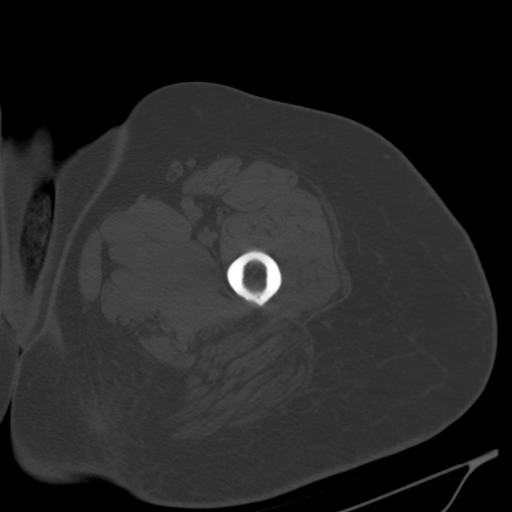
[im 10/76  soft-tissue]
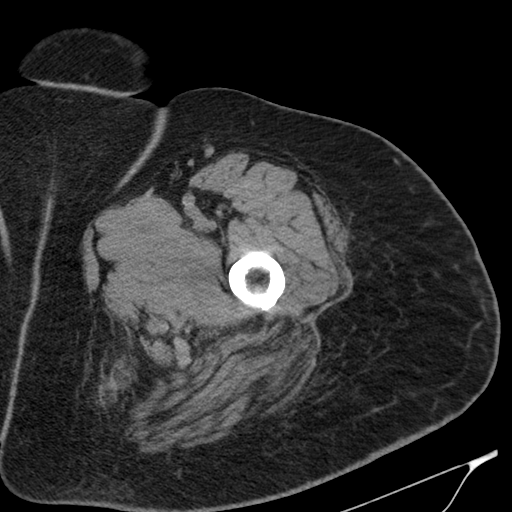
[im 15/76  soft-tissue]
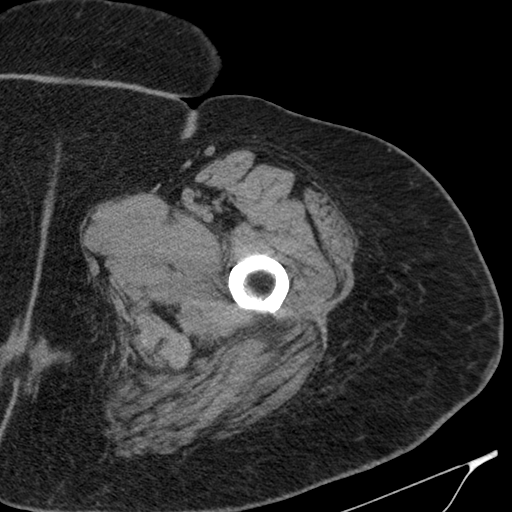
[im 22/76  soft-tissue]
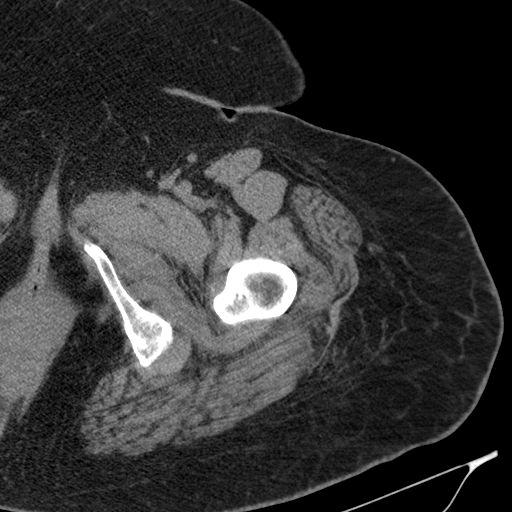
[im 27/76  soft-tissue]
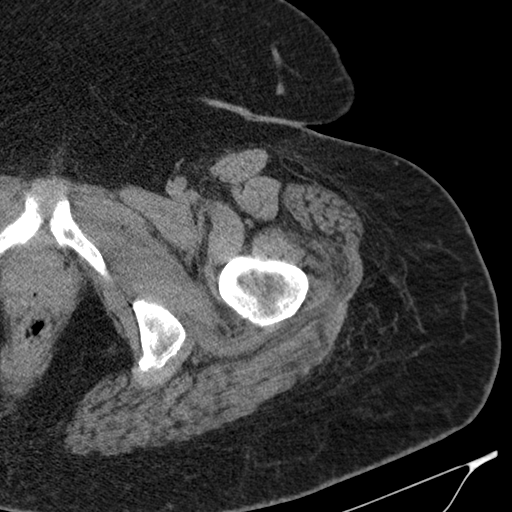
[im 32/76  soft-tissue]
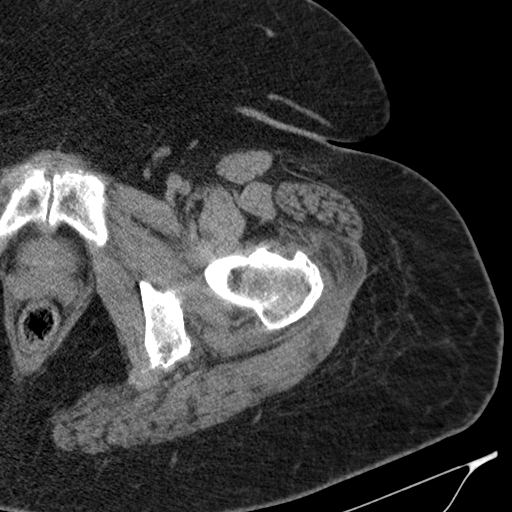
[im 39/76  soft-tissue]
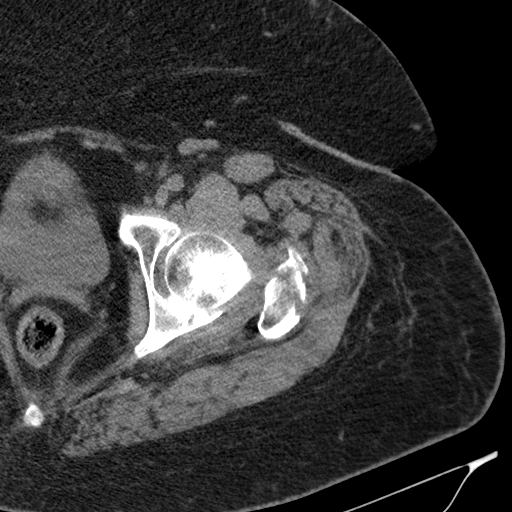
[im 44/76  soft-tissue]
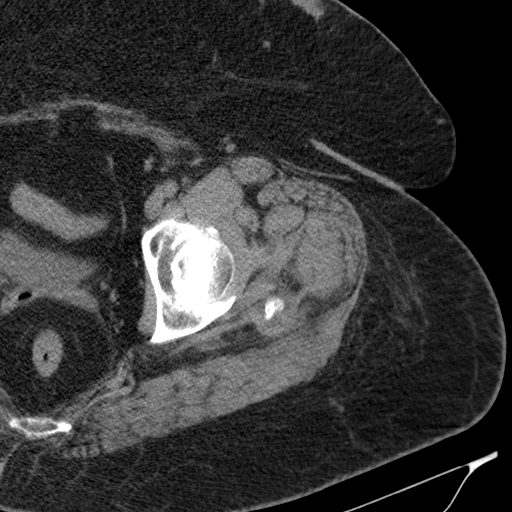
[im 49/76  soft-tissue]
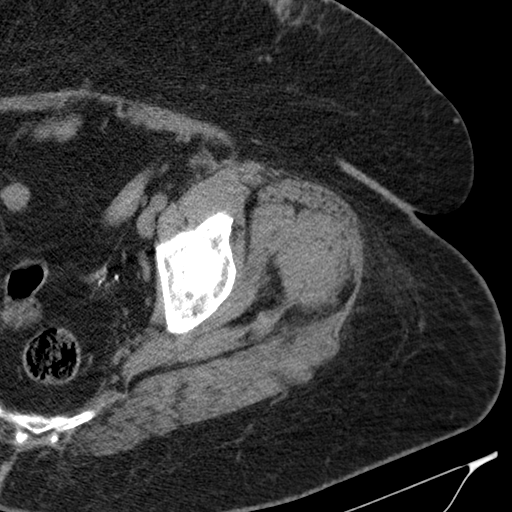
[im 49/76  bone]
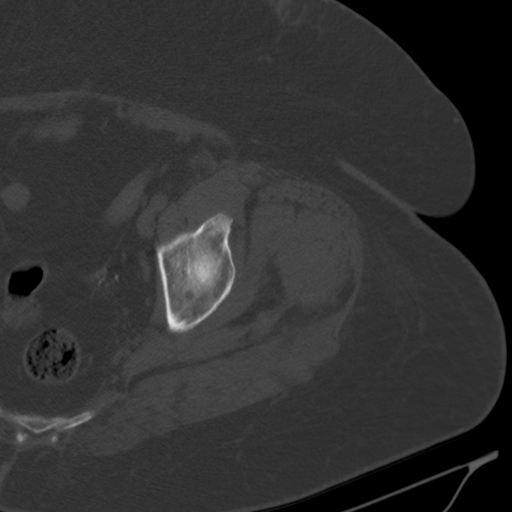
[im 54/76  soft-tissue]
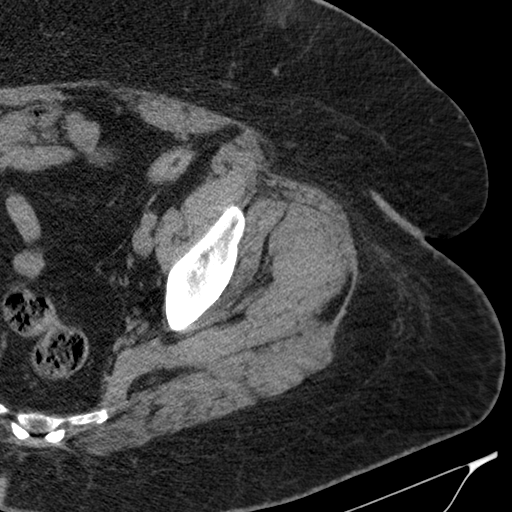
[im 61/76  soft-tissue]
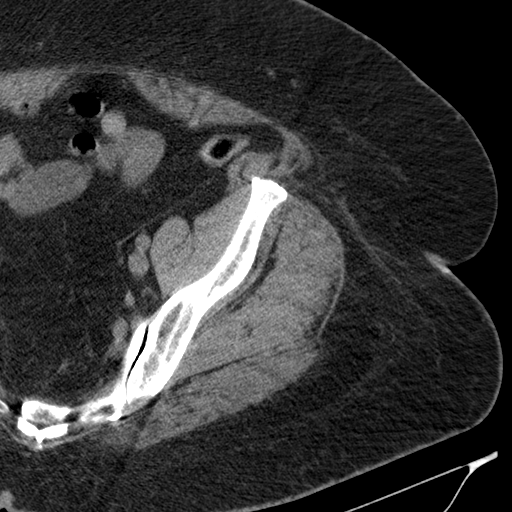
[im 66/76  soft-tissue]
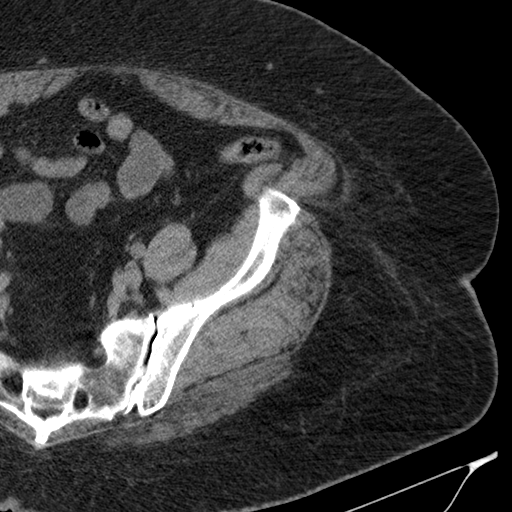
[im 66/76  lung]
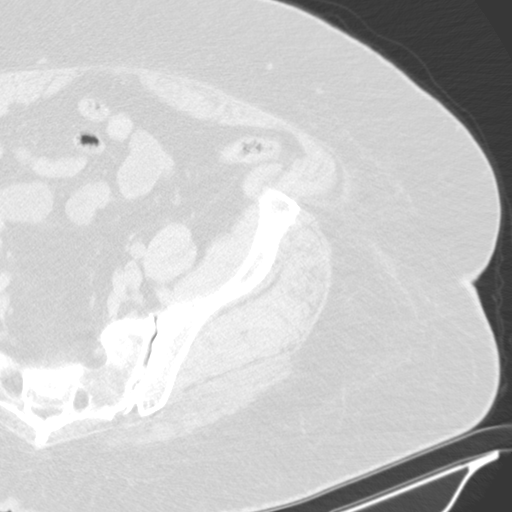
[im 68/76  lung]
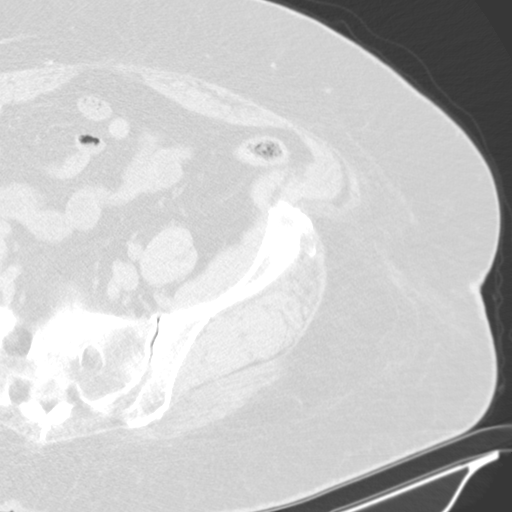
[im 71/76  soft-tissue]
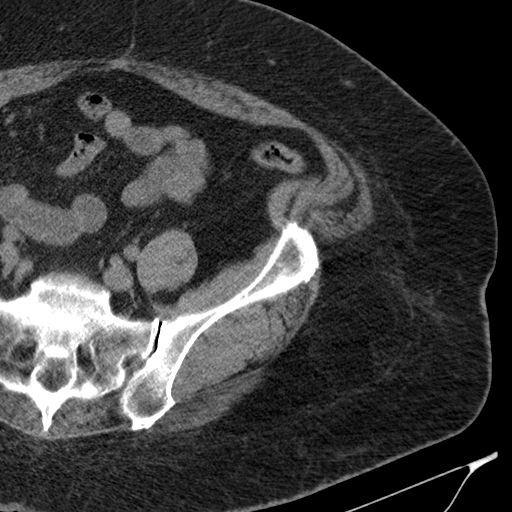
[im 71/76  lung]
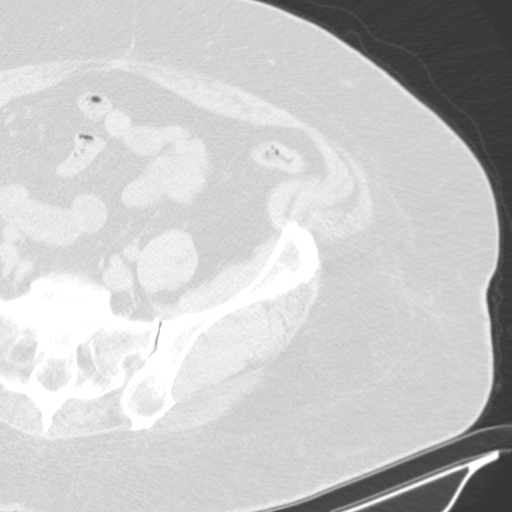
[im 73/76  lung]
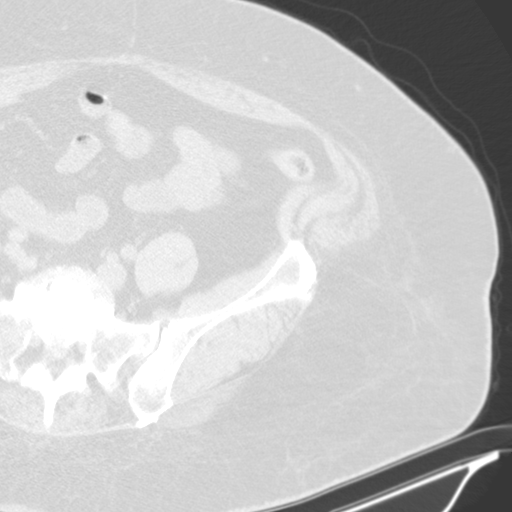

[15 of 32 positions shown; findings below may reference images not displayed]

FINDINGS: Bones/Joint/Cartilage

Acute minimally displaced fracture through the base of the greater
trochanter without intertrochanteric extension. No additional
fracture. No dislocation. Mild left hip joint space narrowing with
small marginal osteophytes. No joint effusion.

Ligaments

Ligaments are suboptimally evaluated by CT.

Muscles and Tendons
Grossly intact.

Soft tissue
No fluid collection or hematoma.  No soft tissue mass.
IMPRESSION: 1. Acute minimally displaced fracture through the base of the left
greater trochanter without intertrochanteric extension.
2. Mild left hip osteoarthritis.

These results will be called to the ordering clinician or
representative by the [HOSPITAL] at the imaging location.

## 2022-09-07 ENCOUNTER — Telehealth: Payer: Self-pay

## 2022-09-07 NOTE — Progress Notes (Signed)
   09/07/2022  Patient ID: Katherine Costa, female   DOB: 1950-04-19, 72 y.o.   MRN: [id number]  Patient is inquiring about the recent Rybelsus dose increase from 3mg  to 7mg  daily, stating she still has not received the 7mg  tablets.  I informed her that this was received by the office a couple of weeks ago, and a chart note reflects she was notified.  Apparently there was confusion around if it was her Rybelsus from [facility] PAP or her husband's, whom also receives Rybelsus from that program.  Patient aware her Rybelsus 7mg  is available for pick up at Dr. Kelton Pillar office.  Lenna Gilford, PharmD, DPLA

## 2022-10-10 ENCOUNTER — Encounter: Payer: Self-pay | Admitting: Family Medicine

## 2022-10-10 DIAGNOSIS — M159 Polyosteoarthritis, unspecified: Secondary | ICD-10-CM

## 2022-10-12 MED ORDER — LISINOPRIL-HYDROCHLOROTHIAZIDE 10-12.5 MG PO TABS: 1.0000 | ORAL_TABLET | Freq: Every day | ORAL | 1 refills | Status: DC

## 2022-10-12 MED ORDER — CELECOXIB 200 MG PO CAPS
200.0000 mg | ORAL_CAPSULE | Freq: Every day | ORAL | 1 refills | Status: DC
Start: 2022-10-12 — End: 2023-04-23

## 2022-10-12 MED ORDER — OMEPRAZOLE 40 MG PO CPDR: 40.0000 mg | DELAYED_RELEASE_CAPSULE | Freq: Every day | ORAL | 1 refills | Status: DC

## 2022-10-30 ENCOUNTER — Ambulatory Visit (INDEPENDENT_AMBULATORY_CARE_PROVIDER_SITE_OTHER): Payer: Medicare Other

## 2022-10-30 DIAGNOSIS — Z23 Encounter for immunization: Secondary | ICD-10-CM

## 2022-11-12 ENCOUNTER — Ambulatory Visit: Payer: Medicare Other | Admitting: Family Medicine

## 2022-11-12 ENCOUNTER — Encounter: Payer: Self-pay | Admitting: Family Medicine

## 2022-11-12 VITALS — BP 118/80 | HR 67 | Temp 97.7°F | Ht 64.0 in | Wt 248.2 lb

## 2022-11-12 DIAGNOSIS — E109 Type 1 diabetes mellitus without complications: Secondary | ICD-10-CM

## 2022-11-12 LAB — POCT GLYCOSYLATED HEMOGLOBIN (HGB A1C): Hemoglobin A1C: 7.3 % — AB (ref 4.0–5.6)

## 2022-11-12 MED ORDER — RYBELSUS 7 MG PO TABS
14.0000 mg | ORAL_TABLET | Freq: Every day | ORAL | Status: DC
Start: 2022-11-12 — End: 2023-03-22

## 2022-11-12 NOTE — Progress Notes (Signed)
Established Patient Office Visit  Subjective   Patient ID: Katherine Costa, female    DOB: 31-Jan-1950  Age: 72 y.o. MRN: [id number]  Chief Complaint  Patient presents with   Medical Management of Chronic Issues    Pt is here for follow up today.  DM-- pt reports she is doing well on the rybelsus 7 mg daily. She is on 64 units daily of tresiba daily plus her short acting insulin with meals, states she gives herself  usually around 9-15 units with each meal. Pt states she has not lost any weight with the rybelsus, however her A1C today is improved to 7.3. we discussed increasing to 14 mg daily and she is agreeable to try this. She denies any nausea or constipation.    Current Outpatient Medications  Medication Instructions   aspirin EC 81 mg, Oral, Daily, Swallow whole.   augmented betamethasone dipropionate (DIPROLENE) 0.05 % ointment Topical, 3 times daily PRN, rash   celecoxib (CELEBREX) 200 mg, Oral, Daily   clotrimazole-betamethasone (LOTRISONE) cream APPLY TOPICALLY THREE TIMES A DAY AS NEEDED FOR RASH.   Continuous Blood Gluc Receiver (DEXCOM G7 RECEIVER) DEVI 1 receiver once.   Continuous Blood Gluc Sensor (DEXCOM G7 SENSOR) MISC Apply 1 sensor every 10 days.   insulin aspart (FIASP FLEXTOUCH) 100 UNIT/ML FlexTouch Pen Subcutaneous, As directed   lisinopril-hydrochlorothiazide (ZESTORETIC) 10-12.5 MG tablet 1 tablet, Oral, Daily   Magnesium 400 MG CAPS Oral, Daily   omeprazole (PRILOSEC) 40 mg, Oral, Daily   ondansetron (ZOFRAN) 4 mg, Oral, Every 8 hours PRN   oxybutynin (DITROPAN-XL) 5 MG 24 hr tablet TAKE ONE TABLET BY MOUTH AT BEDTIME   rosuvastatin (CRESTOR) 20 mg, Oral, Daily   Rybelsus 14 mg, Oral, Daily   Tresiba FlexTouch 60 Units, Subcutaneous, Daily at bedtime, Sample  exp: 03/2021 and lot [id number].   vitamin C 1,000 mg, Oral, Daily    Patient Active Problem List   Diagnosis Date Noted   Osteoarthritis of multiple joints 08/19/2021   Urinary incontinence  06/24/2018   Gastroesophageal reflux disease 02/11/2018   Primary localized osteoarthritis of left knee 09/13/2015   Pain in joint, shoulder region 06/01/2014   Morbid obesity (HCC) 04/21/2013   Encounter for long-term (current) use of other medications 09/02/2011   Disorder of liver 03/17/2010   ASYMPTOMATIC POSTMENOPAUSAL STATUS 05/21/2009   HERPES LABIALIS 03/13/2009   HYPERCHOLESTEROLEMIA 02/09/2008   Type 1 diabetes mellitus (HCC) 08/24/2006   Essential hypertension 08/24/2006      Review of Systems  All other systems reviewed and are negative.     Objective:     BP 118/80 (BP Location: Left Arm, Patient Position: Sitting, Cuff Size: Large)   Pulse 67   Temp 97.7 F (36.5 C) (Oral)   Ht 5\' 4"  (1.626 m)   Wt 248 lb 3.2 oz (112.6 kg)   SpO2 98%   BMI 42.60 kg/m    Physical Exam Vitals reviewed.  Constitutional:      Appearance: Normal appearance. She is morbidly obese.  Pulmonary:     Effort: Pulmonary effort is normal.  Musculoskeletal:     Right lower leg: No edema.     Left lower leg: No edema.  Neurological:     Mental Status: She is alert and oriented to person, place, and time. Mental status is at baseline.  Psychiatric:        Mood and Affect: Mood normal.        Behavior: Behavior normal.  Results for orders placed or performed in visit on 11/12/22  POC HgB A1c  Result Value Ref Range   Hemoglobin A1C 7.3 (A) 4.0 - 5.6 %   HbA1c POC (<> result, manual entry)     HbA1c, POC (prediabetic range)     HbA1c, POC (controlled diabetic range)        The 10-year ASCVD risk score (Arnett DK, et al., 2019) is: 17.3%    Assessment & Plan:  Type 1 diabetes mellitus without complication (HCC) -     POCT glycosylated hemoglobin (Hb A1C) -     Rybelsus; Take 2 tablets (14 mg total) by mouth daily.   Plan is to increase rybelsus to 14 mg daily. A1C is much better today. Ok to follow up in 5 months -- she is UTD on her health maintenance and blood  work. I will see her back in 5 months for her annual lab work and A1C recheck.  Return in about 5 months (around 04/19/2023) for DM- fasting for bloodwork-- ok to schedule with his wife.    Karie Georges, MD

## 2022-11-13 LAB — HM DIABETES EYE EXAM

## 2022-11-24 DIAGNOSIS — M25511 Pain in right shoulder: Secondary | ICD-10-CM | POA: Diagnosis not present

## 2022-11-24 DIAGNOSIS — M542 Cervicalgia: Secondary | ICD-10-CM | POA: Diagnosis not present

## 2022-12-01 ENCOUNTER — Encounter: Payer: Medicare Other | Admitting: Family Medicine

## 2022-12-10 ENCOUNTER — Other Ambulatory Visit: Payer: Self-pay

## 2022-12-10 NOTE — Progress Notes (Signed)
12/10/2022 Name: Katherine Costa MRN: [id number] DOB: 11-22-1950  Chief Complaint  Patient presents with   Diabetes   Medication Management    Katherine Costa is a 72 y.o. year old female who presented for a telephone visit.   They were referred to the pharmacist by their PCP for assistance in managing diabetes and complex medication management.    Subjective:  Care Team: Primary Care Provider: Karie Georges, MD ; Next Scheduled Visit: 04/23/23  Medication Access/Adherence  Current Pharmacy:  [facility] - [place], [place] - [address] [place] [address] Phone: [phone] Fax: [phone]   Patient reports affordability concerns with their medications: No  Patient reports access/transportation concerns to their pharmacy: No  Patient reports adherence concerns with their medications:  No     Diabetes:  Current medications: Rybelsus 14mg  (just started today), Tresiba 60-66 units daily, Fiasp SS Medications tried in the past: None  Using Dexcom G7 Sensors  Date of Download: 12/10/22   Patient denies hypoglycemic s/sx including dizziness, shakiness, sweating. Patient denies hyperglycemic symptoms including polyuria, polydipsia, polyphagia, nocturia, neuropathy, blurred vision.   Current medication access support: Rybelsus, Pauline Good through [facility]   Objective:  Lab Results  Component Value Date   HGBA1C 7.3 (A) 11/12/2022    Lab Results  Component Value Date   CREATININE 1.05 04/17/2022   BUN 16 04/17/2022   NA 138 04/17/2022   K 4.6 04/17/2022   CL 103 04/17/2022   CO2 26 04/17/2022    Lab Results  Component Value Date   CHOL 147 04/17/2022   HDL 74.00 04/17/2022   LDLCALC 46 04/17/2022   LDLDIRECT 110.6 09/02/2011   TRIG 137.0 04/17/2022   CHOLHDL 2 04/17/2022    Medications Reviewed Today     Reviewed by Sherrill Raring, RPH (Pharmacist) on 12/10/22 at 1923  Med List  Status: <None>   Medication Order Taking? Sig Documenting Provider Last Dose Status Informant  aspirin 81 MG EC tablet [id number] Yes Take 81 mg by mouth daily. Swallow whole. [provider] Taking Active   augmented betamethasone dipropionate (DIPROLENE) 0.05 % ointment [id number] Yes Apply topically 3 (three) times daily as needed. rash Romero Belling, MD Taking Active   celecoxib (CELEBREX) 200 MG capsule [id number] Yes Take 1 capsule (200 mg total) by mouth daily. Karie Georges, MD Taking Active   clotrimazole-betamethasone Thurmond Butts) cream [id number] Yes APPLY TOPICALLY THREE TIMES A DAY AS NEEDED FOR RASH. Wynn Banker, MD Taking Active   Continuous Blood Gluc Receiver  [facility] G7 RECEIVER) [place] [id number] Yes 1 receiver once. Karie Georges, MD Taking Active   Continuous Blood Gluc Sensor (DEXCOM G7 SENSOR) [place] [id number] Yes Apply 1 sensor every 10 days. Karie Georges, MD Taking Active   insulin aspart (FIASP FLEXTOUCH) 100 UNIT/ML FlexTouch Pen [id number] Yes Inject into the skin as directed. [provider] Taking Active   insulin degludec (TRESIBA FLEXTOUCH) 100 UNIT/ML FlexTouch Pen [id number] Yes Inject 60 Units into the skin at bedtime. Sample  exp: 03/2021 and lot [id number]. Karie Georges, MD Taking Active            Med Note Littie Deeds, [facility] A   Tue Aug 18, 2022  5:34 PM) PAP  lisinopril-hydrochlorothiazide (ZESTORETIC) 10-12.5 MG tablet [id number] Yes Take 1 tablet by mouth daily. Karie Georges, MD Taking Active   omeprazole (PRILOSEC) 40 MG capsule [id number] Yes Take 1 capsule (40 mg total)  by mouth daily. Karie Georges, MD Taking Active   ondansetron [facility]) 4 MG tablet [id number] Yes Take 1 tablet (4 mg total) by mouth every 8 (eight) hours as needed for nausea or vomiting. Sherrilyn Rist, MD Taking Active   oxybutynin (DITROPAN-XL) 5 MG 24 hr tablet [id number] No TAKE ONE TABLET BY MOUTH AT BEDTIME  Patient not taking: Reported on  12/10/2022   Wynn Banker, MD Not Taking Active   rosuvastatin (CRESTOR) 20 MG tablet [id number] Yes TAKE 1 TABLET BY MOUTH DAILY Karie Georges, MD Taking Active   Semaglutide [facility]) 7 MG TABS [id number] Yes Take 2 tablets (14 mg total) by mouth daily. Karie Georges, MD Taking Active               Assessment/Plan:   Diabetes: - Currently uncontrolled - Reviewed long term cardiovascular and renal outcomes of uncontrolled blood sugar - Reviewed goal A1c, goal fasting, and goal 2 hour post prandial glucose - Reviewed dietary modifications including low carb diet - Recommend to continue current medication therapy .    Follow Up Plan: 5 months  Sherrill Raring, PharmD Clinical Pharmacist [phone]

## 2022-12-28 NOTE — Telephone Encounter (Signed)
PAP: Patient assistance application for Rybelsus has been approved by PAP Companies: [employer] from 08/23/2022 to 01/26/2023. Medication should be delivered to PAP Delivery: Provider's office For further shipping updates, please contact [employer] at [phone] Pt ID is: no id

## 2023-01-05 ENCOUNTER — Telehealth: Payer: Self-pay | Admitting: Family Medicine

## 2023-01-05 NOTE — Telephone Encounter (Signed)
Katherine Costa from [employer] and stated she want you to know that the application has been approved . Katherine Costa's # is [phone] she stated you can call her if you need her.

## 2023-01-06 ENCOUNTER — Telehealth: Payer: Self-pay

## 2023-01-06 NOTE — Telephone Encounter (Signed)
Katherine Costa is going to [employer] App to Pt for Re-Enroll for [employer].

## 2023-01-07 NOTE — Telephone Encounter (Signed)
Mail out PAP for [employer] for pt to fill out and send back,fax doctors portion.01/07/23,Rybelsus 7mg ,Tresiba,Fiasp.

## 2023-01-08 ENCOUNTER — Encounter: Payer: Self-pay | Admitting: Family Medicine

## 2023-01-08 NOTE — Telephone Encounter (Signed)
 Care team updated and letter sent for eye exam notes.

## 2023-01-26 ENCOUNTER — Encounter: Payer: Self-pay | Admitting: Family Medicine

## 2023-01-28 DIAGNOSIS — L820 Inflamed seborrheic keratosis: Secondary | ICD-10-CM | POA: Diagnosis not present

## 2023-01-28 NOTE — Telephone Encounter (Signed)
 Noted.

## 2023-01-29 ENCOUNTER — Other Ambulatory Visit: Payer: Self-pay | Admitting: Family Medicine

## 2023-01-29 DIAGNOSIS — E109 Type 1 diabetes mellitus without complications: Secondary | ICD-10-CM

## 2023-02-01 DIAGNOSIS — H40053 Ocular hypertension, bilateral: Secondary | ICD-10-CM | POA: Diagnosis not present

## 2023-02-01 DIAGNOSIS — H40033 Anatomical narrow angle, bilateral: Secondary | ICD-10-CM | POA: Diagnosis not present

## 2023-02-01 DIAGNOSIS — H25813 Combined forms of age-related cataract, bilateral: Secondary | ICD-10-CM | POA: Diagnosis not present

## 2023-02-12 NOTE — Telephone Encounter (Signed)
Spoke with pt regarding pap for [employer] ,pt explain she has been APPROVED for 2025 on all of her three meds Ozempic, Rybelsus, Forde Dandy is expecting a refill soon.

## 2023-03-22 ENCOUNTER — Telehealth: Payer: Self-pay

## 2023-03-22 MED ORDER — RYBELSUS 14 MG PO TABS: 14.0000 mg | ORAL_TABLET | Freq: Every day | ORAL | 0 refills | Status: DC

## 2023-03-22 NOTE — Progress Notes (Signed)
   03/22/2023  Patient ID: Katherine Costa, female   DOB: [birth year], 73 y.o.   MRN: [id number]  Patient notified me that she is out of her Rybelsus 14mg  that she should be getting from [facility] on PAP. Contacted [facility] and they are experiencing shipping delays. Provided a 30 day supply voucher for patient to get at local pharmacy.  BIN: [id number] PCND[place] BJYNW [id number] ID:  [id number]  Patient aware and requests rx for 1 month to [facility] on [address]  Sherrill Raring, PharmD Clinical Pharmacist [phone]

## 2023-04-23 ENCOUNTER — Telehealth: Payer: Self-pay | Admitting: Family Medicine

## 2023-04-23 ENCOUNTER — Ambulatory Visit (INDEPENDENT_AMBULATORY_CARE_PROVIDER_SITE_OTHER): Payer: Medicare Other | Admitting: Family Medicine

## 2023-04-23 ENCOUNTER — Encounter: Payer: Self-pay | Admitting: Family Medicine

## 2023-04-23 VITALS — BP 122/60 | HR 63 | Temp 97.8°F | Ht 63.0 in | Wt 245.6 lb

## 2023-04-23 DIAGNOSIS — R350 Frequency of micturition: Secondary | ICD-10-CM

## 2023-04-23 DIAGNOSIS — K219 Gastro-esophageal reflux disease without esophagitis: Secondary | ICD-10-CM

## 2023-04-23 DIAGNOSIS — N3946 Mixed incontinence: Secondary | ICD-10-CM

## 2023-04-23 DIAGNOSIS — I10 Essential (primary) hypertension: Secondary | ICD-10-CM

## 2023-04-23 DIAGNOSIS — E7800 Pure hypercholesterolemia, unspecified: Secondary | ICD-10-CM | POA: Diagnosis not present

## 2023-04-23 DIAGNOSIS — E109 Type 1 diabetes mellitus without complications: Secondary | ICD-10-CM

## 2023-04-23 DIAGNOSIS — Z0000 Encounter for general adult medical examination without abnormal findings: Secondary | ICD-10-CM

## 2023-04-23 DIAGNOSIS — M150 Primary generalized (osteo)arthritis: Secondary | ICD-10-CM | POA: Diagnosis not present

## 2023-04-23 LAB — LIPID PANEL
Cholesterol: 204 mg/dL — ABNORMAL HIGH (ref 0–200)
HDL: 80.2 mg/dL (ref >39.00)
LDL Cholesterol: 108 mg/dL — ABNORMAL HIGH (ref 0–99)
NonHDL: 123.98
Total CHOL/HDL Ratio: 3
Triglycerides: 80 mg/dL (ref 0.0–149.0)
VLDL: 16 mg/dL (ref 0.0–40.0)

## 2023-04-23 LAB — COMPREHENSIVE METABOLIC PANEL WITH GFR
ALT: 17 U/L (ref 0–35)
AST: 23 U/L (ref 0–37)
Albumin: 3.8 g/dL (ref 3.5–5.2)
Alkaline Phosphatase: 97 U/L (ref 39–117)
BUN: 17 mg/dL (ref 6–23)
CO2: 29 meq/L (ref 19–32)
Calcium: 9.1 mg/dL (ref 8.4–10.5)
Chloride: 103 meq/L (ref 96–112)
Creatinine, Ser: 0.78 mg/dL (ref 0.40–1.20)
GFR: 76.83 mL/min (ref >60.00)
Glucose, Bld: 108 mg/dL — ABNORMAL HIGH (ref 70–99)
Potassium: 4.2 meq/L (ref 3.5–5.1)
Sodium: 138 meq/L (ref 135–145)
Total Bilirubin: 0.5 mg/dL (ref 0.2–1.2)
Total Protein: 6.8 g/dL (ref 6.0–8.3)

## 2023-04-23 LAB — MICROALBUMIN / CREATININE URINE RATIO
Creatinine,U: 121.9 mg/dL
Microalb Creat Ratio: UNDETERMINED mg/g (ref 0.0–30.0)
Microalb, Ur: <0.7 mg/dL

## 2023-04-23 LAB — POCT GLYCOSYLATED HEMOGLOBIN (HGB A1C): Hemoglobin A1C: 7.8 % — AB (ref 4.0–5.6)

## 2023-04-23 MED ORDER — OMEPRAZOLE 40 MG PO CPDR: 40.0000 mg | DELAYED_RELEASE_CAPSULE | Freq: Every day | ORAL | 1 refills | Status: DC

## 2023-04-23 MED ORDER — CELECOXIB 200 MG PO CAPS: 200.0000 mg | ORAL_CAPSULE | Freq: Every day | ORAL | 1 refills | Status: DC

## 2023-04-23 MED ORDER — LISINOPRIL-HYDROCHLOROTHIAZIDE 10-12.5 MG PO TABS: 1.0000 | ORAL_TABLET | Freq: Every day | ORAL | 1 refills | Status: DC

## 2023-04-23 MED ORDER — OXYBUTYNIN CHLORIDE 5 MG PO TABS: 5.0000 mg | ORAL_TABLET | Freq: Every day | ORAL | 1 refills | Status: DC

## 2023-04-23 NOTE — Telephone Encounter (Signed)
 Pt brought a replacement vial back a couple months ago where pt assistance sent her a replacement vial instead of a pen for her insulin.  She has never received a replacement for that.  She is requesting a call back.

## 2023-04-23 NOTE — Progress Notes (Signed)
 Complete physical exam  Patient: Katherine Costa   DOB: 1950-04-02   73 y.o. Female  MRN: [id number]  Subjective:    Chief Complaint  Patient presents with   Annual Exam    Katherine Costa is a 73 y.o. female who presents today for a complete physical exam. She reports consuming a general diet. Takes a probiotic, no vitamins or other herbal supplements. Home exercise routine includes chores, working in the yard. She generally feels well. She reports sleeping well. She does not have additional problems to discuss today.    Most recent fall risk assessment:    04/23/2023    7:57 AM  Fall Risk   Falls in the past year? 0  Number falls in past yr: 0  Injury with Fall? 0  Risk for fall due to : No Fall Risks  Follow up Falls evaluation completed     Most recent depression screenings:    04/23/2023    7:57 AM 08/20/2022    9:24 AM  PHQ 2/9 Scores  PHQ - 2 Score 0 0  PHQ- 9 Score 0 0    Vision:Within last year and [facility], Dr. Loraine Leriche wells, [phone] and Dental: No current dental problems and Receives regular dental care     Patient Care Team: Karie Georges, MD as PCP - General (Family Medicine) Althea Charon, MD (Radiology) [place], Milas Kocher, [facility] (Pharmacist) [place], Venida Jarvis, OD (Optometry) Carlynn Purl, OD (Optometry)   Outpatient Medications Prior to Visit  Medication Sig   aspirin 81 MG EC tablet Take 81 mg by mouth daily. Swallow whole.   augmented betamethasone dipropionate (DIPROLENE) 0.05 % ointment Apply topically 3 (three) times daily as needed. rash   clotrimazole-betamethasone (LOTRISONE) cream APPLY TOPICALLY THREE TIMES A DAY AS NEEDED FOR RASH.   Continuous Blood Gluc Receiver (DEXCOM G7 RECEIVER) DEVI 1 receiver once.   Continuous Glucose Sensor (DEXCOM G7 SENSOR) MISC APPLY 1 SENSOR EVERY 10 DAYS   insulin aspart (FIASP FLEXTOUCH) 100 UNIT/ML FlexTouch Pen Inject into the skin as directed.   insulin degludec (TRESIBA  FLEXTOUCH) 100 UNIT/ML FlexTouch Pen Inject 60 Units into the skin at bedtime. Sample  exp: 03/2021 and lot [id number].   ondansetron (ZOFRAN) 4 MG tablet Take 1 tablet (4 mg total) by mouth every 8 (eight) hours as needed for nausea or vomiting.   rosuvastatin (CRESTOR) 20 MG tablet TAKE 1 TABLET BY MOUTH DAILY   Semaglutide (RYBELSUS) 14 MG TABS Take 1 tablet (14 mg total) by mouth daily. Bill voucher not insurance BIN [phone] PCN [id number] ID [id number]   [DISCONTINUED] celecoxib (CELEBREX) 200 MG capsule Take 1 capsule (200 mg total) by mouth daily.   [DISCONTINUED] lisinopril-hydrochlorothiazide (ZESTORETIC) 10-12.5 MG tablet Take 1 tablet by mouth daily.   [DISCONTINUED] omeprazole (PRILOSEC) 40 MG capsule Take 1 capsule (40 mg total) by mouth daily.   [DISCONTINUED] oxybutynin (DITROPAN-XL) 5 MG 24 hr tablet TAKE ONE TABLET BY MOUTH AT BEDTIME   No facility-administered medications prior to visit.    Review of Systems  HENT:  Negative for hearing loss.   Eyes:  Negative for blurred vision.  Respiratory:  Negative for shortness of breath.   Cardiovascular:  Negative for chest pain.  Gastrointestinal: Negative.   Genitourinary: Negative.   Musculoskeletal:  Negative for back pain.  Neurological:  Negative for headaches.  Psychiatric/Behavioral:  Negative for depression.        Objective:     BP 122/60  Pulse 63   Temp 97.8 F (36.6 C) (Oral)   Ht 5\' 3"  (1.6 m)   Wt 245 lb 9.6 oz (111.4 kg)   SpO2 98%   BMI 43.51 kg/m    Physical Exam Vitals reviewed.  Constitutional:      Appearance: Normal appearance. She is well-groomed. She is morbidly obese.  HENT:     Right Ear: Tympanic membrane and ear canal normal.     Left Ear: Tympanic membrane and ear canal normal.     Mouth/Throat:     Mouth: Mucous membranes are moist.     Pharynx: No posterior oropharyngeal erythema.  Eyes:     Conjunctiva/sclera: Conjunctivae normal.  Neck:     Thyroid: No thyromegaly.   Cardiovascular:     Rate and Rhythm: Normal rate and regular rhythm.     Pulses: Normal pulses.     Heart sounds: S1 normal and S2 normal.  Pulmonary:     Effort: Pulmonary effort is normal.     Breath sounds: Normal breath sounds and air entry.  Abdominal:     General: Abdomen is flat. Bowel sounds are normal.     Palpations: Abdomen is soft.  Musculoskeletal:     Right lower leg: No edema.     Left lower leg: No edema.  Lymphadenopathy:     Cervical: No cervical adenopathy.  Neurological:     Mental Status: She is alert and oriented to person, place, and time. Mental status is at baseline.     Gait: Gait is intact.  Psychiatric:        Mood and Affect: Mood and affect normal.        Speech: Speech normal.        Behavior: Behavior normal.        Judgment: Judgment normal.         Assessment & Plan:    Routine Health Maintenance and Physical Exam  Immunization History  Administered Date(s) Administered   Fluad Quad(high Dose 65+) 10/14/2018, 03/18/2020, 10/22/2021   Fluad Trivalent(High Dose 65+) 10/30/2022   Influenza Whole 10/26/2008, 12/06/2009, 10/02/2010   Influenza, High Dose Seasonal PF 10/29/2017   Influenza,inj,Quad PF,6+ Mos 10/31/2013   Influenza-Unspecified 11/16/2015, 11/17/2016   PFIZER(Purple Top)SARS-COV-2 Vaccination 02/26/2019, 03/19/2019, 01/04/2020   Pneumococcal Conjugate-13 09/13/2019   Pneumococcal Polysaccharide-23 05/21/2009, 03/26/2021   RSV,unspecified 01/15/2022   Tdap 06/29/2017   Zoster Recombinant(Shingrix) 01/15/2022, 04/10/2022   Zoster, Live 12/11/2014    Health Maintenance  Topic Date Due   OPHTHALMOLOGY EXAM  01/16/2012   COVID-19 Vaccine (4 - 2024-25 season) 09/27/2022   Medicare Annual Wellness (AWV)  11/28/2022   FOOT EXAM  04/10/2023   HEMOGLOBIN A1C  10/24/2023   Diabetic kidney evaluation - eGFR measurement  04/22/2024   Diabetic kidney evaluation - Urine ACR  04/22/2024   MAMMOGRAM  06/03/2024   Colonoscopy   03/05/2025   DTaP/Tdap/Td (2 - Td or Tdap) 06/30/2027   Pneumonia Vaccine 62+ Years old  Completed   INFLUENZA VACCINE  Completed   DEXA SCAN  Completed   Hepatitis C Screening  Completed   Zoster Vaccines- Shingrix  Completed   HPV VACCINES  Aged Out    Discussed health benefits of physical activity, and encouraged her to engage in regular exercise appropriate for her age and condition.  Type 1 diabetes mellitus without complication (HCC) Assessment & Plan: A1C today is around 7.8, this is stable for her although not considered well controlled. She is non adherent to the low  carb diet and does not really get a lot of physical activity during the day. On insulin and rybelsus 14 mg daily. Will continue this and check A1C every 6 months  Orders: -     POCT glycosylated hemoglobin (Hb A1C) -     Comprehensive metabolic panel with GFR; Future -     Microalbumin / creatinine urine ratio; Future -     Collection capillary blood specimen  Routine general medical examination at a health care facility     -- Physical exam findings are stable from previous, I counseled the patient on increasing exercise per the CDC recommendations (150 minutes per week). Handouts were given on healthy eating and exercise, health maintenance reviewed.   Pure hypercholesterolemia Assessment & Plan: On statin medication for primary prevention, will get new lipid panel today  Orders: -     Lipid panel; Future  Primary osteoarthritis involving multiple joints Assessment & Plan: Doing relatively well on celebrex, will refill today, checking CMP for kidney function evaluation  Orders: -     Celecoxib; Take 1 capsule (200 mg total) by mouth daily.  Dispense: 100 capsule; Refill: 1  Mixed stress and urge urinary incontinence Patient is requesting a medication for her bladder, states she continues to have frequent urination and is waking up at night to use the bathroom. I counseled the patient on the  risks/benefits of taking oxybutinin, will give a small dose for her at bedtime.   -     oxyBUTYnin Chloride; Take 1 tablet (5 mg total) by mouth at bedtime.  Dispense: 100 tablet; Refill: 1  Essential hypertension Assessment & Plan: Current hypertension medications:       Sig   lisinopril-hydrochlorothiazide (ZESTORETIC) 10-12.5 MG tablet Take 1 tablet by mouth daily.      BP is well controlled today on the above medication, will continue this, no changes needed at this time. Ordering CMP today.  Orders: -     Lisinopril-hydroCHLOROthiazide; Take 1 tablet by mouth daily.  Dispense: 100 tablet; Refill: 1  Gastroesophageal reflux disease, unspecified whether esophagitis present -     Omeprazole; Take 1 capsule (40 mg total) by mouth daily.  Dispense: 100 capsule; Refill: 1    Return in 6 months (on 10/24/2023).     Karie Georges, MD

## 2023-04-23 NOTE — Progress Notes (Signed)
   04/23/2023  Patient ID: Katherine Costa, female   DOB: 10/18/1950, 73 y.o.   MRN: [id number]  Followed up with [employer] on behalf of patient to get update on patient's fiasp shipment. Patient was originally suppose to receive Fiasp PENS and received Fiasp CARTRIDGES instead.  Company does have this corrected on their end and they do have on file that patient is getting Tresiba 200 unit/ml pens for future.  They are still experiencing shipping delays but are working on getting this out to the patient. Notified patient and she confirmed she still has some on hand to use and does not need a voucher at this time.  Patient will contact us if her supply gets low and she still has not received corrected product.  Sherrill Raring, PharmD Clinical Pharmacist [phone]

## 2023-04-23 NOTE — Patient Instructions (Signed)

## 2023-04-26 ENCOUNTER — Encounter: Payer: Self-pay | Admitting: Family Medicine

## 2023-04-26 NOTE — Assessment & Plan Note (Signed)
 A1C today is around 7.8, this is stable for her although not considered well controlled. She is non adherent to the low carb diet and does not really get a lot of physical activity during the day. On insulin and rybelsus 14 mg daily. Will continue this and check A1C every 6 months

## 2023-04-26 NOTE — Assessment & Plan Note (Signed)
 Current hypertension medications:       Sig   lisinopril-hydrochlorothiazide (ZESTORETIC) 10-12.5 MG tablet Take 1 tablet by mouth daily.      BP is well controlled today on the above medication, will continue this, no changes needed at this time. Ordering CMP today.

## 2023-04-26 NOTE — Assessment & Plan Note (Signed)
 On statin medication for primary prevention, will get new lipid panel today

## 2023-04-26 NOTE — Assessment & Plan Note (Signed)
 Doing relatively well on celebrex, will refill today, checking CMP for kidney function evaluation

## 2023-05-03 ENCOUNTER — Ambulatory Visit (INDEPENDENT_AMBULATORY_CARE_PROVIDER_SITE_OTHER): Admitting: Family Medicine

## 2023-05-03 ENCOUNTER — Encounter: Payer: Self-pay | Admitting: Family Medicine

## 2023-05-03 ENCOUNTER — Ambulatory Visit: Payer: Self-pay

## 2023-05-03 VITALS — BP 138/76 | HR 76 | Temp 98.4°F | Ht 63.0 in | Wt 245.2 lb

## 2023-05-03 DIAGNOSIS — R300 Dysuria: Secondary | ICD-10-CM

## 2023-05-03 DIAGNOSIS — N3001 Acute cystitis with hematuria: Secondary | ICD-10-CM | POA: Diagnosis not present

## 2023-05-03 LAB — POC URINALSYSI DIPSTICK (AUTOMATED)
Bilirubin, UA: NEGATIVE
Blood, UA: POSITIVE
Glucose, UA: POSITIVE — AB
Ketones, UA: NEGATIVE
Nitrite, UA: NEGATIVE
Protein, UA: POSITIVE — AB
Spec Grav, UA: >=1.03 — AB (ref 1.010–1.025)
Urobilinogen, UA: 0.2 U/dL
pH, UA: 5.5 (ref 5.0–8.0)

## 2023-05-03 MED ORDER — NITROFURANTOIN MONOHYD MACRO 100 MG PO CAPS: 100.0000 mg | ORAL_CAPSULE | Freq: Two times a day (BID) | ORAL | 0 refills | Status: AC

## 2023-05-03 NOTE — Telephone Encounter (Signed)
 Chief Complaint: Urine Frequency Symptoms: increased output, increased urgency, foul odor, mild pain with urination, lower back pain Frequency: Onset Saturday  Pertinent Negatives: Patient denies blood in urine Disposition: [] ED /[] Urgent Care (no appt availability in office) / [x] Appointment(In office/virtual)/ []  [facility]/ [] Home Care/ [] Refused Recommended Disposition /[] [facility]/ []  Follow-up with PCP Additional Notes: Patient reports symptoms of a uti in the office parking lot now, appointment scheduled.  Copied from CRM [id number]. Topic: Clinical - Red Word Triage >> May 03, 2023  8:49 AM Georgeanna Harrison H wrote: Red Word that prompted transfer to Nurse Triage: Pt states she has a UTI. Reason for Disposition  Urinating more frequently than usual (i.e., frequency)  Answer Assessment - Initial Assessment Questions 1. SYMPTOM: "What's the main symptom you're concerned about?" (e.g., frequency, incontinence)     Frequency  2. ONSET: "When did the  frequency  start?"     Saturday  3. PAIN: "Is there any pain?" If Yes, ask: "How bad is it?" (Scale: 1-10; mild, moderate, severe)     Mild  4. CAUSE: "What do you think is causing the symptoms?"     Maybe a UTI 5. OTHER SYMPTOMS: "Do you have any other symptoms?" (e.g., blood in urine, fever, flank pain, pain with urination)     Mild pain urination, lower back pain, odor, increased urgency  6. PREGNANCY: "Is there any chance you are pregnant?" "When was your last menstrual period?"     N/A  Protocols used: Urinary Symptoms-A-AH

## 2023-05-03 NOTE — Progress Notes (Signed)
 Established Patient Office Visit   Subjective  Patient ID: Katherine Costa, female    DOB: [birth year]  Age: 73 y.o. MRN: [id number]  Chief Complaint  Patient presents with   Urinary Frequency    Started 2 days ago, Urine frequency, burning, stomach and lower back pain, odor     Patient is a 73 year old female followed by Dr. Casimiro Needle and seen for acute concern.  Patient endorses urinary frequency, dysuria, suprapubic and right lower back pain x 2 days.  Drinking 1 bottle of water per day and tea throughout the day.  Pt notes history of prior UTI that caused sepsis.    Patient Active Problem List   Diagnosis Date Noted   Osteoarthritis of multiple joints 08/19/2021   Urinary incontinence 06/24/2018   Gastroesophageal reflux disease 02/11/2018   Primary localized osteoarthritis of left knee 09/13/2015   Pain in joint, shoulder region 06/01/2014   Morbid obesity (HCC) 04/21/2013   Encounter for long-term (current) use of other medications 09/02/2011   Disorder of liver 03/17/2010   ASYMPTOMATIC POSTMENOPAUSAL STATUS 05/21/2009   HERPES LABIALIS 03/13/2009   HYPERCHOLESTEROLEMIA 02/09/2008   Type 1 diabetes mellitus (HCC) 08/24/2006   Essential hypertension 08/24/2006   Past Medical History:  Diagnosis Date   Arthritis    ASYMPTOMATIC POSTMENOPAUSAL STATUS 05/21/2009   DIABETES MELLITUS, TYPE I 08/24/2006   Family history of colon cancer    HERPES LABIALIS 03/13/2009   HYPERCHOLESTEROLEMIA 02/09/2008   Hyperlipidemia    HYPERTENSION 08/24/2006   Hypertension    LIVER DISORDER 03/17/2010   Tubulovillous adenoma of colon    2010    Past Surgical History:  Procedure Laterality Date   ABDOMINAL HYSTERECTOMY     endometriosis; menorrhagia   ACHILLES TENDON REPAIR  2010   right heel   CHOLECYSTECTOMY  2001   TOTAL KNEE ARTHROPLASTY Left 09/13/2015   Procedure: TOTAL KNEE ARTHROPLASTY;  Surgeon: Frederico Hamman, MD;  Location: [facility] OR;  Service: Orthopedics;  Laterality: Left;    Social History   Tobacco Use   Smoking status: Never   Smokeless tobacco: Never  Substance Use Topics   Alcohol use: No    Alcohol/week: 0.0 standard drinks of alcohol   Drug use: No   Family History  Problem Relation Age of Onset   Cancer Mother        Colon Cancer, Pancreatic Cancer   Colon cancer Mother    Heart attack Father 47   Diabetes Sister    Colon cancer Sister    Cancer Sister        Breast Cancer   Colon polyps Brother    Diabetes Brother    Heart disease Brother        related to agent orange   Diabetes Brother    Prostate cancer Maternal Grandfather    Heart disease Paternal Grandmother    Diabetes Daughter    Kidney failure Daughter    Heart disease Daughter 15   Esophageal cancer Neg Hx    Rectal cancer Neg Hx    Stomach cancer Neg Hx    Allergies  Allergen Reactions   Pioglitazone     REACTION: Edema      ROS Negative unless stated above    Objective:     BP 138/76 (BP Location: Left Arm, Patient Position: Sitting, Cuff Size: Normal)   Pulse 76   Temp 98.4 F (36.9 C) (Oral)   Ht 5\' 3"  (1.6 m)   Wt 245 lb 3.2 oz (111.2 kg)  SpO2 98%   BMI 43.44 kg/m  BP Readings from Last 3 Encounters:  05/03/23 138/76  04/23/23 122/60  11/12/22 118/80   Wt Readings from Last 3 Encounters:  05/03/23 245 lb 3.2 oz (111.2 kg)  04/23/23 245 lb 9.6 oz (111.4 kg)  11/12/22 248 lb 3.2 oz (112.6 kg)      Physical Exam Constitutional:      General: She is not in acute distress.    Appearance: Normal appearance. She is not ill-appearing.  HENT:     Head: Normocephalic.     Mouth/Throat:     Mouth: Mucous membranes are moist.  Cardiovascular:     Rate and Rhythm: Normal rate.  Pulmonary:     Effort: Pulmonary effort is normal.  Abdominal:     General: Bowel sounds are normal. There is no distension.     Palpations: Abdomen is soft.     Tenderness: There is no abdominal tenderness. There is no right CVA tenderness, left CVA tenderness,  guarding or rebound.  Skin:    General: Skin is warm and dry.  Neurological:     Mental Status: She is alert and oriented to person, place, and time.    Results for orders placed or performed in visit on 05/03/23  POCT Urinalysis Dipstick (Automated)  Result Value Ref Range   Color, UA yellow    Clarity, UA cloudy    Glucose, UA Positive (A) Negative   Bilirubin, UA neg    Ketones, UA neg    Spec Grav, UA >=1.030 (A) 1.010 - 1.025   Blood, UA pos    pH, UA 5.5 5.0 - 8.0   Protein, UA Positive (A) Negative   Urobilinogen, UA 0.2 0.2 or 1.0 E.U./dL   Nitrite, UA neg    Leukocytes, UA Small (1+) (A) Negative      Assessment & Plan:  Acute cystitis with hematuria -     Nitrofurantoin Monohyd Macro; Take 1 capsule (100 mg total) by mouth 2 (two) times daily for 7 days.  Dispense: 14 capsule; Refill: 0  Dysuria -     POCT Urinalysis Dipstick (Automated) -     Urine Culture -     Nitrofurantoin Monohyd Macro; Take 1 capsule (100 mg total) by mouth 2 (two) times daily for 7 days.  Dispense: 14 capsule; Refill: 0   Patient with acute urinary symptoms.  POC UA with leuks, protein, glucose, RBCs, SG greater than 1.030.  Patient advised to increase p.o. intake of water and fluids.  Start ABX.  No history of antibiotic allergies.  Sent for UCX.  Further recommendations if needed based on results.  Given precautions.  Return if symptoms worsen or fail to improve.   Deeann Saint, MD

## 2023-05-03 NOTE — Telephone Encounter (Signed)
 Ok to close

## 2023-05-05 ENCOUNTER — Telehealth: Payer: Self-pay

## 2023-05-05 LAB — URINE CULTURE
MICRO NUMBER:: 16296950
SPECIMEN QUALITY:: ADEQUATE

## 2023-05-05 NOTE — Telephone Encounter (Signed)
 Copied from CRM [id number]. Topic: General - Other >> May 05, 2023 12:18 PM Truddie Crumble wrote: Reason for CRM: patient called stating she received a letter regarding her insurance and and wanted to speak to angela the pharmacist about it

## 2023-05-05 NOTE — Telephone Encounter (Signed)
 Returned patient's phone call. She was requesting information on which programs her and spouse were using for PAP. Provided information. Patient states no further concerns at this time.

## 2023-05-06 ENCOUNTER — Encounter: Payer: Self-pay | Admitting: Family Medicine

## 2023-05-06 ENCOUNTER — Other Ambulatory Visit: Payer: Self-pay | Admitting: Family Medicine

## 2023-05-06 DIAGNOSIS — R300 Dysuria: Secondary | ICD-10-CM

## 2023-05-06 MED ORDER — AMOXICILLIN-POT CLAVULANATE 500-125 MG PO TABS: 1.0000 | ORAL_TABLET | Freq: Two times a day (BID) | ORAL | 0 refills | Status: AC

## 2023-05-21 ENCOUNTER — Other Ambulatory Visit (INDEPENDENT_AMBULATORY_CARE_PROVIDER_SITE_OTHER): Payer: Medicare Other

## 2023-05-21 DIAGNOSIS — E109 Type 1 diabetes mellitus without complications: Secondary | ICD-10-CM

## 2023-05-21 NOTE — Progress Notes (Signed)
 05/21/2023 Name: Katherine Costa MRN: [id number] DOB: 1950/09/18  Chief Complaint  Patient presents with   Diabetes   Medication Management    Katherine Costa is a 73 y.o. year old female who presented for a telephone visit.   They were referred to the pharmacist by their PCP for assistance in managing diabetes and complex medication management.    Subjective:  Care Team: Primary Care Provider: Aida House, MD   Medication Access/Adherence  Current Pharmacy:  [facility] - [place], [place] - [address] [place] [address] Phone: [phone] Fax: [phone]  [facility] [id number] - [place], [place] - [address] AT [facility] [address] [place] [address] Phone: [phone] Fax: [phone]   Patient reports affordability concerns with their medications: No  Patient reports access/transportation concerns to their pharmacy: No  Patient reports adherence concerns with their medications:  No     Diabetes:  Current medications: Rybelsus  14mg , Tresiba  60-66 units daily, Fiasp  SS Medications tried in the past: None  Using Dexcom G7 Sensors  Date of Download:     Patient denies hypoglycemic s/sx including dizziness, shakiness, sweating. Patient denies hyperglycemic symptoms including polyuria, polydipsia, polyphagia, nocturia, neuropathy, blurred vision.   Current medication access support: Rybelsus , Tresiba , Fiasp  through [employer]  Does report poor eating habits and irregular, late night schedules right now as her sister battles cancer   Objective:  Lab Results  Component Value Date   HGBA1C 7.8 (A) 04/23/2023    Lab Results  Component Value Date   CREATININE 0.78 04/23/2023   BUN 17 04/23/2023   NA 138 04/23/2023   K 4.2 04/23/2023   CL 103 04/23/2023   CO2 29 04/23/2023    Lab Results  Component Value Date   CHOL 204 (H) 04/23/2023   HDL 80.20  04/23/2023   LDLCALC 108 (H) 04/23/2023   LDLDIRECT 110.6 09/02/2011   TRIG 80.0 04/23/2023   CHOLHDL 3 04/23/2023    Medications Reviewed Today     Reviewed by Katherine Costa, RPH (Pharmacist) on 05/21/23 at 1558  Med List Status: <None>   Medication Order Taking? Sig Documenting Provider Last Dose Status Informant  aspirin 81 MG EC tablet [id number] Yes Take 81 mg by mouth daily. Swallow whole. [provider] Taking Active   augmented betamethasone  dipropionate (DIPROLENE ) 0.05 % ointment [id number] Yes Apply topically 3 (three) times daily as needed. rash Katherine Lerner, MD Taking Active   celecoxib  (CELEBREX ) 200 MG capsule [id number] Yes Take 1 capsule (200 mg total) by mouth daily. Katherine House, MD Taking Active   clotrimazole -betamethasone  (LOTRISONE ) cream [id number] Yes APPLY TOPICALLY THREE TIMES A DAY AS NEEDED FOR RASH. Koberlein, Junell C, MD Taking Active   Continuous Blood Gluc Receiver [facility] G7 [place]) [place] [id number]  1 receiver once. Katherine House, MD  Active   Continuous Glucose Sensor [facility] G7 SENSOR) [place] [id number] Yes APPLY 1 SENSOR EVERY 10 DAYS Katherine House, MD Taking Active   insulin  aspart (FIASP  FLEXTOUCH) 100 UNIT/ML FlexTouch Pen [id number] Yes Inject into the skin as directed. [provider] Taking Active   insulin  degludec (TRESIBA  FLEXTOUCH) 100 UNIT/ML FlexTouch Pen [id number] Yes Inject 60 Units into the skin at bedtime. Sample  exp: 03/2021 and lot [id number]. Katherine House, MD Taking Active            Med Note (GENTRY, CHERYL A  Tue Aug 18, 2022  5:34 PM) PAP  lisinopril -hydrochlorothiazide  (ZESTORETIC ) 10-12.5 MG tablet [id number] Yes Take 1 tablet by mouth daily. Katherine House, MD Taking Active   omeprazole  [facility]) 40 MG capsule [id number] Yes Take 1 capsule (40 mg total) by mouth daily. Katherine House, MD Taking Active   ondansetron  (ZOFRAN ) 4 MG tablet [id number] Yes Take 1 tablet (4 mg total) by mouth  every 8 (eight) hours as needed for nausea or vomiting. Katherine Hugger, MD Taking Active   oxybutynin  (DITROPAN ) 5 MG tablet [id number]  Take 1 tablet (5 mg total) by mouth at bedtime.  Patient not taking: Reported on 05/03/2023   Katherine House, MD  Active            Med Note Katherine Costa   Fri May 21, 2023  3:57 PM) Has not started yet  rosuvastatin  (CRESTOR ) 20 MG tablet [id number] Yes TAKE 1 TABLET BY MOUTH DAILY Katherine House, MD Taking Active   Semaglutide  (RYBELSUS ) 14 MG TABS [id number] Yes Take 1 tablet (14 mg total) by mouth daily. Bill voucher not insurance BIN [id number] PCN [employer] [id number] ID [id number] Katherine House, MD Taking Active               Assessment/Plan:   Diabetes: - Currently uncontrolled but improving - Reviewed long term cardiovascular and renal outcomes of uncontrolled blood sugar - Reviewed goal A1c, goal fasting, and goal 2 hour post prandial glucose - Reviewed dietary modifications including low carb diet - Recommend to continue current medication therapy .    Follow Up Plan: 2 months  Katherine Costa, PharmD Clinical Pharmacist [phone]

## 2023-06-06 ENCOUNTER — Encounter (HOSPITAL_BASED_OUTPATIENT_CLINIC_OR_DEPARTMENT_OTHER): Payer: Self-pay

## 2023-06-06 ENCOUNTER — Ambulatory Visit (HOSPITAL_BASED_OUTPATIENT_CLINIC_OR_DEPARTMENT_OTHER)
Admission: EM | Admit: 2023-06-06 | Discharge: 2023-06-06 | Disposition: A | Discharge status: Home / Self Care | Attending: Family Medicine | Admitting: Family Medicine

## 2023-06-06 DIAGNOSIS — R339 Retention of urine, unspecified: Secondary | ICD-10-CM | POA: Insufficient documentation

## 2023-06-06 DIAGNOSIS — N3001 Acute cystitis with hematuria: Secondary | ICD-10-CM | POA: Insufficient documentation

## 2023-06-06 LAB — POCT URINALYSIS DIP (MANUAL ENTRY)
Glucose, UA: NEGATIVE mg/dL
Nitrite, UA: POSITIVE — AB
Protein Ur, POC: 100 mg/dL — AB
Spec Grav, UA: >=1.03 — AB (ref 1.010–1.025)
Urobilinogen, UA: 1 U/dL
pH, UA: 5.5 (ref 5.0–8.0)

## 2023-06-06 MED ORDER — CEPHALEXIN 500 MG PO CAPS: 500.0000 mg | ORAL_CAPSULE | Freq: Two times a day (BID) | ORAL | 0 refills | Status: AC

## 2023-06-06 NOTE — ED Triage Notes (Signed)
 Frequency without much output onset yesterday. Pain when attempting to void.

## 2023-06-06 NOTE — ED Provider Notes (Signed)
 [facility]    CSN: [id number] Arrival date & time: 06/06/23  0943      History   Chief Complaint Chief Complaint  Patient presents with   Dysuria    HPI Katherine Costa is a 73 y.o. female.   73 year old female presents today with urinary frequency, retention and dysuria.  Symptoms been constant since yesterday.  History of recurrent urinary tract infections recently had 1 a month ago.  Denies any abdominal pain, back pain, fevers or chills.   Dysuria   Past Medical History:  Diagnosis Date   Arthritis    ASYMPTOMATIC POSTMENOPAUSAL STATUS 05/21/2009   DIABETES MELLITUS, TYPE I 08/24/2006   Family history of colon cancer    HERPES LABIALIS 03/13/2009   HYPERCHOLESTEROLEMIA 02/09/2008   Hyperlipidemia    HYPERTENSION 08/24/2006   Hypertension    LIVER DISORDER 03/17/2010   Tubulovillous adenoma of colon    2010     Patient Active Problem List   Diagnosis Date Noted   Osteoarthritis of multiple joints 08/19/2021   Urinary incontinence 06/24/2018   Gastroesophageal reflux disease 02/11/2018   Primary localized osteoarthritis of left knee 09/13/2015   Pain in joint, shoulder region 06/01/2014   Morbid obesity (HCC) 04/21/2013   Encounter for long-term (current) use of other medications 09/02/2011   Disorder of liver 03/17/2010   ASYMPTOMATIC POSTMENOPAUSAL STATUS 05/21/2009   HERPES LABIALIS 03/13/2009   HYPERCHOLESTEROLEMIA 02/09/2008   Type 1 diabetes mellitus (HCC) 08/24/2006   Essential hypertension 08/24/2006    Past Surgical History:  Procedure Laterality Date   ABDOMINAL HYSTERECTOMY     endometriosis; menorrhagia   ACHILLES TENDON REPAIR  2010   right heel   CHOLECYSTECTOMY  2001   TOTAL KNEE ARTHROPLASTY Left 09/13/2015   Procedure: TOTAL KNEE ARTHROPLASTY;  Surgeon: Marlena Sima, MD;  Location: [facility] OR;  Service: Orthopedics;  Laterality: Left;    OB History   No obstetric history on file.      Home Medications    Prior to  Admission medications   Medication Sig Start Date End Date Taking? Authorizing Provider  cephALEXin (KEFLEX) 500 MG capsule Take 1 capsule (500 mg total) by mouth 2 (two) times daily for 7 days. 06/06/23 06/13/23 Yes Hydie Langan A, FNP  aspirin 81 MG EC tablet Take 81 mg by mouth daily. Swallow whole.    [provider]  augmented betamethasone  dipropionate (DIPROLENE ) 0.05 % ointment Apply topically 3 (three) times daily as needed. rash 06/29/17   Gwyndolyn Lerner, MD  celecoxib  (CELEBREX ) 200 MG capsule Take 1 capsule (200 mg total) by mouth daily. 04/23/23   Aida House, MD  clotrimazole -betamethasone  (LOTRISONE ) cream APPLY TOPICALLY THREE TIMES A DAY AS NEEDED FOR RASH. 06/26/20   Koberlein, Junell C, MD  Continuous Blood Gluc Receiver (DEXCOM G7 RECEIVER) DEVI 1 receiver once. 04/17/22   Aida House, MD  Continuous Glucose Sensor (DEXCOM G7 SENSOR) MISC APPLY 1 SENSOR EVERY 10 DAYS 01/29/23   Aida House, MD  insulin  aspart (FIASP  FLEXTOUCH) 100 UNIT/ML FlexTouch Pen Inject into the skin as directed.    [provider]  insulin  degludec (TRESIBA  FLEXTOUCH) 100 UNIT/ML FlexTouch Pen Inject 60 Units into the skin at bedtime. Sample  exp: 03/2021 and lot [id number]. 12/11/21   Aida House, MD  lisinopril -hydrochlorothiazide  (ZESTORETIC ) 10-12.5 MG tablet Take 1 tablet by mouth daily. 04/23/23   Aida House, MD  omeprazole  (PRILOSEC) 40 MG capsule Take 1 capsule (40 mg total) by mouth daily.  04/23/23   Aida House, MD  ondansetron  (ZOFRAN ) 4 MG tablet Take 1 tablet (4 mg total) by mouth every 8 (eight) hours as needed for nausea or vomiting. 02/06/22   Albertina Hugger, MD  oxybutynin  (DITROPAN ) 5 MG tablet Take 1 tablet (5 mg total) by mouth at bedtime. Patient not taking: Reported on 05/03/2023 04/23/23   Aida House, MD  rosuvastatin  (CRESTOR ) 20 MG tablet TAKE 1 TABLET BY MOUTH DAILY 01/29/23   Aida House, MD  Semaglutide  (RYBELSUS ) 14 MG  TABS Take 1 tablet (14 mg total) by mouth daily. Bill voucher not insurance BIN [id number] PCN [employer] [id number] ID [id number] 03/22/23   Aida House, MD    Family History Family History  Problem Relation Age of Onset   Cancer Mother        Colon Cancer, Pancreatic Cancer   Colon cancer Mother    Heart attack Father 11   Diabetes Sister    Colon cancer Sister    Cancer Sister        Breast Cancer   Colon polyps Brother    Diabetes Brother    Heart disease Brother        related to agent orange   Diabetes Brother    Prostate cancer Maternal Grandfather    Heart disease Paternal Grandmother    Diabetes Daughter    Kidney failure Daughter    Heart disease Daughter 40   Esophageal cancer Neg Hx    Rectal cancer Neg Hx    Stomach cancer Neg Hx     Social History Social History   Tobacco Use   Smoking status: Never   Smokeless tobacco: Never  Substance Use Topics   Alcohol use: No    Alcohol/week: 0.0 standard drinks of alcohol   Drug use: No     Allergies   Pioglitazone   Review of Systems Review of Systems  Genitourinary:  Positive for dysuria.   See HPI  Physical Exam Triage Vital Signs ED Triage Vitals  Encounter Vitals Group     BP 06/06/23 1028 (!) 140/73     Systolic BP Percentile --      Diastolic BP Percentile --      Pulse Rate 06/06/23 1028 85     Resp 06/06/23 1028 20     Temp 06/06/23 1028 97.6 F (36.4 C)     Temp Source 06/06/23 1028 Oral     SpO2 06/06/23 1028 97 %     Weight --      Height --      Head Circumference --      Peak Flow --      Pain Score 06/06/23 1030 0     Pain Loc --      Pain Education --      Exclude from Growth Chart --    No data found.  Updated Vital Signs BP (!) 140/73 (BP Location: Right Arm)   Pulse 85   Temp 97.6 F (36.4 C) (Oral)   Resp 20   SpO2 97%   Visual Acuity Right Eye Distance:   Left Eye Distance:   Bilateral Distance:    Right Eye Near:   Left Eye Near:    Bilateral  Near:     Physical Exam Vitals and nursing note reviewed.  Constitutional:      General: She is not in acute distress.    Appearance: Normal appearance. She is not ill-appearing, toxic-appearing or diaphoretic.  Pulmonary:  Effort: Pulmonary effort is normal.  Neurological:     Mental Status: She is alert.  Psychiatric:        Mood and Affect: Mood normal.      UC Treatments / Results  Labs (all labs ordered are listed, but only abnormal results are displayed) Labs Reviewed  POCT URINALYSIS DIP (MANUAL ENTRY) - Abnormal; Notable for the following components:      Result Value   Clarity, UA hazy (*)    Bilirubin, UA small (*)    Ketones, POC UA trace (5) (*)    Spec Grav, UA >=1.030 (*)    Blood, UA large (*)    Protein Ur, POC =100 (*)    Nitrite, UA Positive (*)    Leukocytes, UA Trace (*)    All other components within normal limits  URINE CULTURE    EKG   Radiology No results found.  Procedures Procedures (including critical care time)  Medications Ordered in UC Medications - No data to display  Initial Impression / Assessment and Plan / UC Course  I have reviewed the triage vital signs and the nursing notes.  Pertinent labs & imaging results that were available during my care of the patient were reviewed by me and considered in my medical decision making (see chart for details).    Acute cystitis-urine with trace leuks, positive nitrates, large blood, protein, trace ketones small bilirubin and hazy.  Will culture results. Will go ahead and treat with antibiotics at this time.  Treating based on previous culture that was reviewed with Keflex. Recommend increase fluid intake and follow-up as needed for any continued or worsening issues Final Clinical Impressions(s) / UC Diagnoses   Final diagnoses:  Urinary retention  Acute cystitis with hematuria     Discharge Instructions      Treating you for a UTI. Take the antibiotics as prescribed.  Drink  plenty of fluids and follow up as needed.   ED Prescriptions     Medication Sig Dispense Auth. Provider   cephALEXin (KEFLEX) 500 MG capsule Take 1 capsule (500 mg total) by mouth 2 (two) times daily for 7 days. 14 capsule Jerri Morale A, FNP      PDMP not reviewed this encounter.   Landa Pine, FNP 06/06/23 1244

## 2023-06-06 NOTE — Discharge Instructions (Addendum)
 Treating you for a UTI. Take the antibiotics as prescribed.  Drink plenty of fluids and follow up as needed.

## 2023-06-08 ENCOUNTER — Ambulatory Visit (HOSPITAL_COMMUNITY): Payer: Self-pay

## 2023-06-08 LAB — URINE CULTURE: Culture: 20000 — AB

## 2023-06-09 ENCOUNTER — Telehealth: Payer: Self-pay

## 2023-06-09 NOTE — Progress Notes (Signed)
   06/09/2023  Patient ID: Katherine Costa, female   DOB: February 26, 1950, 73 y.o.   MRN: [id number]  Received request from [employer] PAP for refills on following medications for patient:  Fiasp  u-100 flextouch pens Tresiba  u-200 flextouch pens Rybelsus  14mg   Refill sent with PCP signoff.  Carnell Christian, PharmD Clinical Pharmacist [phone]

## 2023-06-10 DIAGNOSIS — Z1231 Encounter for screening mammogram for malignant neoplasm of breast: Secondary | ICD-10-CM | POA: Diagnosis not present

## 2023-06-10 LAB — HM MAMMOGRAPHY

## 2023-06-11 ENCOUNTER — Encounter: Payer: Self-pay | Admitting: Family Medicine

## 2023-06-15 ENCOUNTER — Encounter: Payer: Self-pay | Admitting: Family Medicine

## 2023-06-15 ENCOUNTER — Ambulatory Visit (INDEPENDENT_AMBULATORY_CARE_PROVIDER_SITE_OTHER): Admitting: Family Medicine

## 2023-06-15 VITALS — BP 128/72 | HR 88 | Temp 97.5°F | Resp 16 | Ht 63.0 in | Wt 245.5 lb

## 2023-06-15 DIAGNOSIS — R310 Gross hematuria: Secondary | ICD-10-CM

## 2023-06-15 DIAGNOSIS — R350 Frequency of micturition: Secondary | ICD-10-CM | POA: Diagnosis not present

## 2023-06-15 LAB — POC URINALSYSI DIPSTICK (AUTOMATED)
Bilirubin, UA: NEGATIVE
Blood, UA: POSITIVE
Glucose, UA: NEGATIVE
Ketones, UA: NEGATIVE
Nitrite, UA: NEGATIVE
Protein, UA: NEGATIVE
Spec Grav, UA: 1.01 (ref 1.010–1.025)
Urobilinogen, UA: 0.2 U/dL
pH, UA: 6 (ref 5.0–8.0)

## 2023-06-15 MED ORDER — SULFAMETHOXAZOLE-TRIMETHOPRIM 800-160 MG PO TABS: 1.0000 | ORAL_TABLET | Freq: Two times a day (BID) | ORAL | 0 refills | Status: AC

## 2023-06-15 NOTE — Patient Instructions (Addendum)
 A few things to remember from today's visit:  Urinary frequency - Plan: POCT Urinalysis Dipstick (Automated), Ambulatory referral to Urology, Urine Culture  Gross hematuria - Plan: Ambulatory referral to Urology Try the Oxybutynin  as prescribed. Referral to urologist placed.  We have to consider other non infectious causes. Today Bactrim  sent to your pharmacy. Start taking a probiotic, Align 1 cap daily.  If you need refills for medications you take chronically, please call your pharmacy. Do not use My Chart to request refills or for acute issues that need immediate attention. If you send a my chart message, it may take a few days to be addressed, specially if I am not in the office.  Please be sure medication list is accurate. If a new problem present, please set up appointment sooner than planned today.

## 2023-06-15 NOTE — Progress Notes (Signed)
 ACUTE VISIT Chief Complaint  Patient presents with   Urinary Tract Infection    Symptoms came back yesterday, pressure & frequency.    HPI: Ms.Katherine Costa is a 73 y.o. female with PMHx significant for DM I, GERD, HTN, and HLD here today complaining of recurrent UTI symptoms including pelvic pressure, urinary frequency, and urgency.   Urinary Tract Infection  This is a recurrent problem. The current episode started more than 1 month ago. The problem occurs intermittently. The problem has been waxing and waning. The patient is experiencing no pain. There has been no fever. She is Not sexually active. There is No history of pyelonephritis. Associated symptoms include frequency, hesitancy and urgency. Pertinent negatives include no chills, discharge, flank pain, hematuria, nausea or vomiting. She has tried antibiotics for the symptoms. The treatment provided mild relief. There is no history of kidney stones or a urological procedure.   Pt gets up a couple times a night to urinate.  She recently presented to the UC for similar symptoms on 5/11 with frequency, dysuria, dysuria, and gross hematuria.  Her symptoms her fully resolved after completing treatment with Cephalexin  but returned yesterday.  She reports changes to mental status from suspected UTI and sepsis in late 2023.  Since early 04/2023 she has taken Macrobid ,, Augmentin , and Cephalexin  well.   Urinary incontinence: She was prescribed Oxybutynin , but has not started it due to being uncertain she was able to take while urinary symptoms persist.  No hx of tobacco use. Reports fmhx of bladder cancer (sister).     Component Ref Range & Units (hover) 1 mo ago  MICRO NUMBER: [id number]  SPECIMEN QUALITY: Adequate  Sample Source URINE  STATUS: FINAL  ISOLATE 1: Klebsiella pneumoniae Abnormal   Comment: 10,000-49,000 CFU/mL of Klebsiella pneumoniae        Susceptibility   Klebsiella pneumoniae    URINE CULTURE, REFLEX     AMOX/CLAVULANIC 8 Sensitive    AMPICILLIN >=32 Resistant    AMPICILLIN/SULBACTAM 16 Intermediate    CEFAZOLIN  <=4 Not Reportable 1    CEFEPIME <=1 Sensitive    CEFTAZIDIME <=1 Sensitive    CEFTRIAXONE  <=1 Sensitive    CIPROFLOXACIN  <=0.25 Sensitive    GENTAMICIN <=1 Sensitive    IMIPENEM <=0.25 Sensitive    LEVOFLOXACIN <=0.12 Sensitive    NITROFURANTOIN  64 Intermediate    PIP/TAZO <=4 Sensitive    TOBRAMYCIN <=1 Sensitive    TRIMETH /SULFA  <=20 Sensitive 2            Denies fever, chills, back pain, nausea, vomiting, changes in bowel habits, or MS myalgias. She has not tried OTC medications.  Review of Systems  Constitutional:  Negative for activity change, appetite change, chills and fever.  Respiratory:  Negative for shortness of breath.   Cardiovascular:  Negative for chest pain and leg swelling.  Gastrointestinal:  Negative for nausea and vomiting.  Genitourinary:  Positive for frequency, hesitancy and urgency. Negative for flank pain and hematuria.  Skin:  Negative for rash.  Psychiatric/Behavioral:  Negative for confusion and hallucinations.   See other pertinent positives and negatives in HPI.  Current Outpatient Medications on File Prior to Visit  Medication Sig Dispense Refill   aspirin 81 MG EC tablet Take 81 mg by mouth daily. Swallow whole.     augmented betamethasone  dipropionate (DIPROLENE ) 0.05 % ointment Apply topically 3 (three) times daily as needed. rash 50 g 2   celecoxib  (CELEBREX ) 200 MG capsule Take 1 capsule (200 mg total) by mouth daily.  100 capsule 1   clotrimazole -betamethasone  (LOTRISONE ) cream APPLY TOPICALLY THREE TIMES A DAY AS NEEDED FOR RASH. 45 g 2   Continuous Blood Gluc Receiver (DEXCOM G7 RECEIVER) DEVI 1 receiver once. 1 each 0   Continuous Glucose Sensor (DEXCOM G7 SENSOR) MISC APPLY 1 SENSOR EVERY 10 DAYS 10 each 2   insulin  aspart (FIASP  FLEXTOUCH) 100 UNIT/ML FlexTouch Pen Inject into the skin as directed.     insulin  degludec (TRESIBA   FLEXTOUCH) 100 UNIT/ML FlexTouch Pen Inject 60 Units into the skin at bedtime. Sample  exp: 03/2021 and lot [id number]. 6 mL 0   lisinopril -hydrochlorothiazide  (ZESTORETIC ) 10-12.5 MG tablet Take 1 tablet by mouth daily. 100 tablet 1   omeprazole  (PRILOSEC) 40 MG capsule Take 1 capsule (40 mg total) by mouth daily. 100 capsule 1   ondansetron  (ZOFRAN ) 4 MG tablet Take 1 tablet (4 mg total) by mouth every 8 (eight) hours as needed for nausea or vomiting. 2 tablet 0   oxybutynin  (DITROPAN ) 5 MG tablet Take 1 tablet (5 mg total) by mouth at bedtime. 100 tablet 1   rosuvastatin  (CRESTOR ) 20 MG tablet TAKE 1 TABLET BY MOUTH DAILY 100 tablet 1   Semaglutide  (RYBELSUS ) 14 MG TABS Take 1 tablet (14 mg total) by mouth daily. Bill voucher not insurance BIN [phone] PCN CNRX GROUP [id number] ID [id number] 30 tablet 0   No current facility-administered medications on file prior to visit.    Past Medical History:  Diagnosis Date   Arthritis    ASYMPTOMATIC POSTMENOPAUSAL STATUS 05/21/2009   DIABETES MELLITUS, TYPE I 08/24/2006   Family history of colon cancer    HERPES LABIALIS 03/13/2009   HYPERCHOLESTEROLEMIA 02/09/2008   Hyperlipidemia    HYPERTENSION 08/24/2006   Hypertension    LIVER DISORDER 03/17/2010   Tubulovillous adenoma of colon    2010    Allergies  Allergen Reactions   Pioglitazone     REACTION: Edema    Social History   Socioeconomic History   Marital status: Married    Spouse name: Not on file   Number of children: 3   Years of education: Not on file   Highest education level: Not on file  Occupational History   Occupation: Administrator, sports  Tobacco Use   Smoking status: Never   Smokeless tobacco: Never  Substance and Sexual Activity   Alcohol use: No    Alcohol/week: 0.0 standard drinks of alcohol   Drug use: No   Sexual activity: Not on file  Other Topics Concern   Not on file  Social History Narrative   PT does not get regular exercise   3 girls   Social  Drivers of Corporate investment banker Strain: Low Risk  (04/23/2023)   Overall Financial Resource Strain (CARDIA)    Difficulty of Paying Living Expenses: Not hard at all  Food Insecurity: No Food Insecurity (04/23/2023)   Hunger Vital Sign    Worried About Running Out of Food in the Last Year: Never true    Ran Out of Food in the Last Year: Never true  Transportation Needs: No Transportation Needs (04/23/2023)   PRAPARE - Administrator, Civil Service (Medical): No    Lack of Transportation (Non-Medical): No  Physical Activity: Inactive (04/23/2023)   Exercise Vital Sign    Days of Exercise per Week: 0 days    Minutes of Exercise per Session: 0 min  Stress: No Stress Concern Present (04/23/2023)   [employer] of Occupational Health - Occupational Stress  Questionnaire    Feeling of Stress : Not at all  Social Connections: Moderately Integrated (04/23/2023)   Social Connection and Isolation Panel [NHANES]    Frequency of Communication with Friends and Family: More than three times a week    Frequency of Social Gatherings with Friends and Family: More than three times a week    Attends Religious Services: 1 to 4 times per year    Active Member of Clubs or Organizations: No    Attends Banker Meetings: Never    Marital Status: Married    Vitals:   06/15/23 1347  BP: 128/72  Pulse: 88  Resp: 16  Temp: (!) 97.5 F (36.4 C)  SpO2: 99%   Body mass index is 43.49 kg/m.  Physical Exam Vitals and nursing note reviewed.  Constitutional:      General: She is not in acute distress.    Appearance: She is well-developed.  HENT:     Head: Normocephalic and atraumatic.  Eyes:     Conjunctiva/sclera: Conjunctivae normal.  Cardiovascular:     Rate and Rhythm: Normal rate and regular rhythm.  Pulmonary:     Effort: Pulmonary effort is normal. No respiratory distress.     Breath sounds: Normal breath sounds.  Abdominal:     Palpations: Abdomen is soft.  There is no mass.     Tenderness: There is no abdominal tenderness. There is no right CVA tenderness or left CVA tenderness.  Skin:    General: Skin is warm.     Findings: No erythema.  Neurological:     General: No focal deficit present.     Mental Status: She is alert and oriented to person, place, and time.     Gait: Gait normal.  Psychiatric:        Mood and Affect: Mood and affect normal.    ASSESSMENT AND PLAN: Ms. CHRISETTE MAN was seen today for urinary frequency and pressure.  Urinary frequency We discussed possible etiologies, noninfectious causes need to be considered given the fact the symptoms have been recurrent despite of antibiotic treatment. Recommend starting oxybutynin  5 mg as prescribed, we discussed son side effects. She has been treated with antibiotics a couple times. Urine dipstick today positive for blood and 3+ leukocytes. After discussing son side effects, she would like to proceed with empiric antibiotic treatment, Bactrim  DS twice daily for 3 days recommended. Because she has taking antibiotics frequently for the past few weeks, recommend starting OTC probiotic. Monitor for new symptoms. Instructed about warning signs  -     POCT Urinalysis Dipstick (Automated) -     Urine Culture -     Ambulatory referral to Urology -     Sulfamethoxazole -Trimethoprim ; Take 1 tablet by mouth 2 (two) times daily for 3 days.  Dispense: 6 tablet; Refill: 0  Gross hematuria She reports episode of gross hematuria on 06/06/2023. No hx of kidney stones. Recommend urology consultation, referral placed.  -     Ambulatory referral to Urology   I spent a total of 31 minutes in both face to face and non face to face activities for this visit on the date of this encounter. During this time history was obtained and documented, examination was performed, prior labs reviewed, and assessment/plan discussed.  Return in 2 weeks (on 06/29/2023) for Urinary symptoms with PCP if  persistent.Cherryl Corona, acting as a scribe for Zarif Rathje [place], MD., have documented all relevant documentation on the behalf of Johnluke Haugen [place], MD,  as directed by   while in the presence of Shareen Capwell [place], MD.  I, Ona Rathert [place], MD, have reviewed all documentation for this visit. The documentation on 06/15/23 for the exam, diagnosis, procedures, and orders are all accurate and complete.  Woodie Trusty G. [place], MD  [facility]. [place] office.

## 2023-06-16 ENCOUNTER — Ambulatory Visit: Payer: Self-pay | Admitting: Family Medicine

## 2023-06-16 LAB — URINE CULTURE
MICRO NUMBER:: 16478822
Result:: NO GROWTH
SPECIMEN QUALITY:: ADEQUATE

## 2023-06-17 ENCOUNTER — Ambulatory Visit: Payer: Self-pay | Admitting: Family Medicine

## 2023-07-09 ENCOUNTER — Other Ambulatory Visit: Payer: Self-pay | Admitting: Family Medicine

## 2023-07-14 ENCOUNTER — Other Ambulatory Visit (INDEPENDENT_AMBULATORY_CARE_PROVIDER_SITE_OTHER)

## 2023-07-14 ENCOUNTER — Other Ambulatory Visit

## 2023-07-14 DIAGNOSIS — E109 Type 1 diabetes mellitus without complications: Secondary | ICD-10-CM

## 2023-07-14 NOTE — Progress Notes (Cosign Needed)
 07/14/2023 Name: Katherine Costa MRN: [id number] DOB: [birth year]  Chief Complaint  Patient presents with   Medication Management   Diabetes    Katherine Costa is a 73 y.o. year old female who presented for a telephone visit.   They were referred to the pharmacist by their PCP for assistance in managing diabetes and complex medication management.    Subjective:  Care Team: Primary Care Provider: Aida House, MD   Medication Access/Adherence  Current Pharmacy:  [facility] - [place], [place] - [address] [place] [address] Phone: [phone] Fax: [phone]  [facility] [phone] - [place], [place] - [address] AT [facility] [address] [facility] [address] [place] [address] Phone: [phone] Fax: [phone]   Patient reports affordability concerns with their medications: No  Patient reports access/transportation concerns to their pharmacy: No  Patient reports adherence concerns with their medications:  No     Diabetes:  Current medications: Rybelsus  14mg , Tresiba  60-66 units daily, Fiasp  SS Medications tried in the past: None  Using Dexcom G7 Sensors  Date of Download:     Patient denies hypoglycemic s/sx including dizziness, shakiness, sweating. Patient denies hyperglycemic symptoms including polyuria, polydipsia, polyphagia, nocturia, neuropathy, blurred vision.   Current medication access support: Rybelsus , Tresiba , Fiasp  through Novo  Has been adjusting her tresiba  based off sugar readings, similar to her fiasp , noticeable sugar swings   Objective:  Lab Results  Component Value Date   HGBA1C 7.8 (A) 04/23/2023    Lab Results  Component Value Date   CREATININE 0.78 04/23/2023   BUN 17 04/23/2023   NA 138 04/23/2023   K 4.2 04/23/2023   CL 103 04/23/2023   CO2 29 04/23/2023    Lab Results  Component Value Date   CHOL 204 (H) 04/23/2023   HDL 80.20  04/23/2023   LDLCALC 108 (H) 04/23/2023   LDLDIRECT 110.6 09/02/2011   TRIG 80.0 04/23/2023   CHOLHDL 3 04/23/2023    Medications Reviewed Today     Reviewed by Carnell Christian, RPH (Pharmacist) on 07/14/23 at 1435  Med List Status: <None>   Medication Order Taking? Sig Documenting Provider Last Dose Status Informant  aspirin 81 MG EC tablet [id number]  Take 81 mg by mouth daily. Swallow whole. [provider]  Active   augmented betamethasone  dipropionate (DIPROLENE ) 0.05 % ointment [id number]  Apply topically 3 (three) times daily as needed. rash Gwyndolyn Lerner, MD  Active   celecoxib  (CELEBREX ) 200 MG capsule [id number]  Take 1 capsule (200 mg total) by mouth daily. Aida House, MD  Active   clotrimazole -betamethasone  (LOTRISONE ) cream [id number]  APPLY TOPICALLY THREE TIMES A DAY AS NEEDED FOR RASH. Koberlein, Junell C, MD  Active   Continuous Blood Gluc Receiver (DEXCOM G7 [place]) [place] [id number]  1 receiver once. Aida House, MD  Active   Continuous Glucose Sensor [facility] G7 SENSOR) [place] [id number]  APPLY 1 SENSOR EVERY 10 DAYS Aida House, MD  Active   insulin  aspart (FIASP  FLEXTOUCH) 100 UNIT/ML FlexTouch Pen [id number]  Inject into the skin as directed. [provider]  Active   insulin  degludec (TRESIBA  FLEXTOUCH) 100 UNIT/ML FlexTouch Pen [id number] Yes Inject 60 Units into the skin at bedtime. Sample  exp: 03/2021 and lot [id number]. Aida House, MD  Active            Med Note (GENTRY, CHERYL A  Tue Aug 18, 2022  5:34 PM) PAP  lisinopril -hydrochlorothiazide  (ZESTORETIC ) 10-12.5 MG tablet [id number]  Take 1 tablet by mouth daily. Aida House, MD  Active   omeprazole  [facility]) 40 MG capsule [id number]  Take 1 capsule (40 mg total) by mouth daily. Aida House, MD  Active   ondansetron  (ZOFRAN ) 4 MG tablet [id number]  Take 1 tablet (4 mg total) by mouth every 8 (eight) hours as needed for nausea or vomiting. Albertina Hugger, MD   Active   oxybutynin  (DITROPAN ) 5 MG tablet [id number]  Take 1 tablet (5 mg total) by mouth at bedtime. Aida House, MD  Active            Med Note Zilphia Hilt, Daria Eddy   Fri May 21, 2023  3:57 PM) Has not started yet  rosuvastatin  (CRESTOR ) 20 MG tablet [id number]  TAKE 1 TABLET BY MOUTH DAILY Aida House, MD  Active   Semaglutide  (RYBELSUS ) 14 MG TABS [id number] Yes Take 1 tablet (14 mg total) by mouth daily. Bill voucher not insurance BIN [id number] PCN CNRX GROUP [id number] ID [id number] Aida House, MD  Active               Assessment/Plan:   Diabetes: - Currently uncontrolled  - Reviewed long term cardiovascular and renal outcomes of uncontrolled blood sugar - Reviewed goal A1c, goal fasting, and goal 2 hour post prandial glucose - Reviewed dietary modifications including low carb diet - Recommend to continue current medication therapy. Counseled on tresiba  being long-acting and giving daily at consistent dose vs fiasp  having a sliding scale and being rapid acting. Patient expressed understanding.   Follow Up Plan: 1 month  Carnell Christian, PharmD Clinical Pharmacist [phone]

## 2023-08-11 ENCOUNTER — Other Ambulatory Visit

## 2023-09-02 DIAGNOSIS — R310 Gross hematuria: Secondary | ICD-10-CM | POA: Diagnosis not present

## 2023-09-02 DIAGNOSIS — R350 Frequency of micturition: Secondary | ICD-10-CM | POA: Diagnosis not present

## 2023-09-17 ENCOUNTER — Other Ambulatory Visit

## 2023-09-17 DIAGNOSIS — E109 Type 1 diabetes mellitus without complications: Secondary | ICD-10-CM

## 2023-09-17 NOTE — Progress Notes (Signed)
 09/17/2023 Name: Katherine Costa MRN: [id number] DOB: 10-14-1950  Chief Complaint  Patient presents with   Medication Management   Diabetes    Katherine Costa is a 73 y.o. year old female who presented for a telephone visit.   They were referred to the pharmacist by their PCP for assistance in managing diabetes and complex medication management.    Subjective:  Care Team: Primary Care Provider: Ozell Heron CHRISTELLA, MD   Medication Access/Adherence  Current Pharmacy:  [facility] - [place], [place] - [address] [place] [address] Phone: [phone] Fax: [phone]  [facility] [phone] - [place], [place] - [address] AT [facility] [address] [facility] [address] [place] [address] Phone: [phone] Fax: [phone]   Patient reports affordability concerns with their medications: No  Patient reports access/transportation concerns to their pharmacy: No  Patient reports adherence concerns with their medications:  No     Diabetes:  Current medications: Rybelsus  14mg , Tresiba  64 units daily, Fiasp  SS Medications tried in the past: None  Using Dexcom G7 Sensors  Date of Download:    No improvement in BG elevations since last appt but overall improvement in variablity and amount of low BG  Patient denies hypoglycemic s/sx including dizziness, shakiness, sweating. Patient denies hyperglycemic symptoms including polyuria, polydipsia, polyphagia, nocturia, neuropathy, blurred vision.   Current medication access support: Rybelsus , Tresiba , Fiasp  through Novo  Since our last call, has been consistently giving herself 64 units tresiba  nightly. Admits that sometimes she injects fiasp  and then gets busy so she forgets to eat and that is when low BG occurs.   Objective:  Lab Results  Component Value Date   HGBA1C 7.8 (A) 04/23/2023    Lab Results  Component Value Date   CREATININE 0.78  04/23/2023   BUN 17 04/23/2023   NA 138 04/23/2023   K 4.2 04/23/2023   CL 103 04/23/2023   CO2 29 04/23/2023    Lab Results  Component Value Date   CHOL 204 (H) 04/23/2023   HDL 80.20 04/23/2023   LDLCALC 108 (H) 04/23/2023   LDLDIRECT 110.6 09/02/2011   TRIG 80.0 04/23/2023   CHOLHDL 3 04/23/2023    Medications Reviewed Today     Reviewed by Lionell Jon DEL, RPH (Pharmacist) on 09/17/23 at 1155  Med List Status: <None>   Medication Order Taking? Sig Documenting Provider Last Dose Status Informant  aspirin 81 MG EC tablet [id number] Yes Take 81 mg by mouth daily. Swallow whole. [provider]  Active   augmented betamethasone  dipropionate (DIPROLENE ) 0.05 % ointment [id number] Yes Apply topically 3 (three) times daily as needed. rash Kassie Mallick, MD  Active   celecoxib  (CELEBREX ) 200 MG capsule [id number] Yes Take 1 capsule (200 mg total) by mouth daily. Ozell Heron CHRISTELLA, MD  Active   clotrimazole -betamethasone  (LOTRISONE ) cream [id number]  APPLY TOPICALLY THREE TIMES A DAY AS NEEDED FOR RASH. Koberlein, Junell C, MD  Active   Continuous Blood Gluc Receiver (DEXCOM G7 [place]) [place] [id number]  1 receiver once. Ozell Heron CHRISTELLA, MD  Active   Continuous Glucose Sensor [facility] G7 SENSOR) [place] [id number] Yes APPLY 1 SENSOR EVERY 10 DAYS Ozell Heron CHRISTELLA, MD  Active   insulin  aspart (FIASP  FLEXTOUCH) 100 UNIT/ML FlexTouch Pen [id number] Yes Inject into the skin as directed. [provider]  Active   insulin  degludec (TRESIBA  FLEXTOUCH) 100 UNIT/ML FlexTouch Pen [id number] Yes Inject  60 Units into the skin at bedtime. Sample  exp: 03/2021 and lot [id number]. Ozell Heron HERO, MD  Active            Med Note ZENA, [facility] A   Tue Aug 18, 2022  5:34 PM) PAP  lisinopril -hydrochlorothiazide  (ZESTORETIC ) 10-12.5 MG tablet [id number] Yes Take 1 tablet by mouth daily. Ozell Heron HERO, MD  Active   omeprazole  [facility]) 40 MG capsule [id number] Yes Take 1 capsule (40 mg  total) by mouth daily. Ozell Heron HERO, MD  Active   ondansetron  (ZOFRAN ) 4 MG tablet [id number]  Take 1 tablet (4 mg total) by mouth every 8 (eight) hours as needed for nausea or vomiting. Legrand Victory LITTIE DOUGLAS, MD  Active   oxybutynin  (DITROPAN ) 5 MG tablet [id number] Yes Take 1 tablet (5 mg total) by mouth at bedtime.  Patient taking differently: Take 5 mg by mouth at bedtime. Taking every other day at bedtime   Ozell Heron HERO, MD  Active            Med Note JANEAN, JON DEL   Fri Sep 17, 2023  9:33 AM)    rosuvastatin  (CRESTOR ) 20 MG tablet [id number] Yes TAKE 1 TABLET BY MOUTH DAILY Ozell Heron HERO, MD  Active   Semaglutide  (RYBELSUS ) 14 MG TABS [id number] Yes Take 1 tablet (14 mg total) by mouth daily. Bill voucher not insurance BIN [id number] PCN CNRX GROUP [id number] ID [id number] Ozell Heron HERO, MD  Active               Assessment/Plan:   Diabetes: - Currently uncontrolled  - Reviewed long term cardiovascular and renal outcomes of uncontrolled blood sugar - Reviewed goal A1c, goal fasting, and goal 2 hour post prandial glucose - Reviewed dietary modifications including low carb diet - INCREASE Tresiba  to 66 units every night. Do not inject fiasp  without a meal to avoid BG lows. If you have given yourself fiasp  injection, you NEED to eat a meal   Follow Up Plan: 6 weeks  JON DEL [place], PharmD Clinical Pharmacist [phone]

## 2023-09-22 ENCOUNTER — Telehealth: Payer: Self-pay

## 2023-09-22 NOTE — Progress Notes (Signed)
   09/22/2023  Patient ID: Katherine Costa, female   DOB: 1950/04/21, 73 y.o.   MRN: [id number]  Received refill request from [facility] PAP for patient's enrolled products.  Sent in 4 month order for Tresiba  U-200 Flextouch, Fiasp  Flextouch, Rybelsus  and Pen Needles. Confirmation fax received.  Jon VEAR [place], PharmD Clinical Pharmacist [phone]

## 2023-09-23 ENCOUNTER — Ambulatory Visit: Admitting: Family Medicine

## 2023-10-01 ENCOUNTER — Ambulatory Visit: Payer: Self-pay | Admitting: Family Medicine

## 2023-10-01 ENCOUNTER — Ambulatory Visit: Admitting: Family Medicine

## 2023-10-01 ENCOUNTER — Encounter: Payer: Self-pay | Admitting: Family Medicine

## 2023-10-01 VITALS — BP 120/72 | HR 60 | Temp 97.6°F | Ht 63.0 in | Wt 249.4 lb

## 2023-10-01 DIAGNOSIS — Z23 Encounter for immunization: Secondary | ICD-10-CM

## 2023-10-01 DIAGNOSIS — E109 Type 1 diabetes mellitus without complications: Secondary | ICD-10-CM | POA: Diagnosis not present

## 2023-10-01 DIAGNOSIS — Z6841 Body Mass Index (BMI) 40.0 and over, adult: Secondary | ICD-10-CM | POA: Diagnosis not present

## 2023-10-01 LAB — HEMOGLOBIN A1C: Hgb A1c MFr Bld: 8.1 % — ABNORMAL HIGH (ref 4.6–6.5)

## 2023-10-01 MED ORDER — TRESIBA FLEXTOUCH 100 UNIT/ML ~~LOC~~ SOPN: 66.0000 [IU] | PEN_INJECTOR | Freq: Every day | SUBCUTANEOUS | Status: DC

## 2023-10-01 NOTE — Progress Notes (Signed)
 Established Patient Office Visit  Subjective   Patient ID: Katherine Costa, female    DOB: [birth year]  Age: 73 y.o. MRN: [id number]  Chief Complaint  Patient presents with   Medical Management of Chronic Issues    Ersie Savino is a 73 year old female with type 1 diabetes who presents with elevated blood sugar levels despite insulin  therapy.  She has been experiencing elevated blood sugar levels despite adhering to her insulin  regimen. Her current insulin  regimen includes 66 units of Tresiba , a long-acting insulin , and a sliding scale for short-acting insulin  with doses of 7, 9, and 11 units based on her blood sugar readings. Despite this, her fasting blood sugar levels in the morning have been consistently in the 200s, with specific readings of 204, 205, 217, and 264 mg/dL.  She attributes these high readings to recent dietary habits, including consuming bread and carbohydrates, and snacking at night. She acknowledges a lack of discipline in her diet, which she believes contributes to her elevated blood sugar levels. She is currently taking Rybelsus , which she reports is helping with her appetite. Her average glucose level over the past 30 days is around 200 mg/dL, corresponding to an J8r of about 8%.    Current Outpatient Medications  Medication Instructions   aspirin EC 81 mg, Daily   augmented betamethasone  dipropionate (DIPROLENE ) 0.05 % ointment Topical, 3 times daily PRN, rash   celecoxib  (CELEBREX ) 200 mg, Oral, Daily   clotrimazole -betamethasone  (LOTRISONE ) cream APPLY TOPICALLY THREE TIMES A DAY AS NEEDED FOR RASH.   Continuous Blood Gluc Receiver (DEXCOM G7 RECEIVER) DEVI 1 receiver once.   Continuous Glucose Sensor (DEXCOM G7 SENSOR) MISC APPLY 1 SENSOR EVERY 10 DAYS   insulin  aspart (FIASP  FLEXTOUCH) 100 UNIT/ML FlexTouch Pen As directed   lisinopril -hydrochlorothiazide  (ZESTORETIC ) 10-12.5 MG tablet 1 tablet, Oral, Daily   omeprazole  (PRILOSEC) 40 mg, Oral, Daily    ondansetron  (ZOFRAN ) 4 mg, Oral, Every 8 hours PRN   oxybutynin  (DITROPAN ) 5 mg, Oral, Daily at bedtime   rosuvastatin  (CRESTOR ) 20 mg, Oral, Daily   Rybelsus  14 mg, Oral, Daily, Bill voucher not insurance BIN [phone] PCN [employer] [id number] ID [id number]   Tresiba  FlexTouch 66 Units, Subcutaneous, Daily at bedtime, Sample  exp: 03/2021 and lot [id number].    Patient Active Problem List   Diagnosis Date Noted   Osteoarthritis of multiple joints 08/19/2021   Urinary incontinence 06/24/2018   Gastroesophageal reflux disease 02/11/2018   Primary localized osteoarthritis of left knee 09/13/2015   Pain in joint, shoulder region 06/01/2014   Morbid obesity (HCC) 04/21/2013   Encounter for long-term (current) use of other medications 09/02/2011   Disorder of liver 03/17/2010   ASYMPTOMATIC POSTMENOPAUSAL STATUS 05/21/2009   HERPES LABIALIS 03/13/2009   HYPERCHOLESTEROLEMIA 02/09/2008   Type 1 diabetes mellitus (HCC) 08/24/2006   Essential hypertension 08/24/2006      Review of Systems  All other systems reviewed and are negative.     Objective:     BP 120/72   Pulse 60   Temp 97.6 F (36.4 C) (Oral)   Ht 5' 3 (1.6 m)   Wt 249 lb 6.4 oz (113.1 kg)   SpO2 98%   BMI 44.18 kg/m    Physical Exam Vitals reviewed.  Constitutional:      Appearance: Normal appearance. She is morbidly obese.  Pulmonary:     Effort: Pulmonary effort is normal.  Musculoskeletal:     Right lower leg: No edema.  Left lower leg: No edema.  Neurological:     Mental Status: She is alert and oriented to person, place, and time. Mental status is at baseline.  Psychiatric:        Mood and Affect: Mood normal.        Behavior: Behavior normal.      No results found for any visits on 10/01/23.    The 10-year ASCVD risk score (Arnett DK, et al., 2019) is: 21.6%    Assessment & Plan:  Type 1 diabetes mellitus without complication (HCC) Assessment & Plan: Type 1 diabetes mellitus, poorly  controlled Fasting glucose levels in the 200s, A1c approximately 8. Dietary habits contributing to poor control. - Order A1c test. - Download Carb Manager app for dietary management. - Encourage dietary modifications to reduce carbohydrate intake. - Continue current insulin  regimen with Tresiba  66 units daily and sliding scale insulin . Also continue Rybelsus  14 mg daily.  - Discuss potential use of appetite suppressants like Wellbutrin or Vyvanse if dietary changes are insufficient.  Orders: -     Hemoglobin A1c; Future -     Tresiba  FlexTouch; Inject 66 Units into the skin at bedtime. Sample  exp: 03/2021 and lot [id number].  Immunization due -     Flu vaccine HIGH DOSE PF(Fluzone Trivalent)     Return in about 6 months (around 03/30/2024) for annual physical exam.    Heron CHRISTELLA Sharper, MD

## 2023-10-01 NOTE — Assessment & Plan Note (Signed)
 Type 1 diabetes mellitus, poorly controlled Fasting glucose levels in the 200s, A1c approximately 8. Dietary habits contributing to poor control. - Order A1c test. - Download Carb Manager app for dietary management. - Encourage dietary modifications to reduce carbohydrate intake. - Continue current insulin  regimen with Tresiba  and sliding scale insulin . - Discuss potential use of appetite suppressants like Wellbutrin or Vyvanse if dietary changes are insufficient.

## 2023-10-03 DIAGNOSIS — R4701 Aphasia: Secondary | ICD-10-CM | POA: Diagnosis not present

## 2023-10-03 DIAGNOSIS — Z794 Long term (current) use of insulin: Secondary | ICD-10-CM | POA: Diagnosis not present

## 2023-10-03 DIAGNOSIS — I160 Hypertensive urgency: Secondary | ICD-10-CM | POA: Diagnosis not present

## 2023-10-03 DIAGNOSIS — I10 Essential (primary) hypertension: Secondary | ICD-10-CM | POA: Diagnosis not present

## 2023-10-03 DIAGNOSIS — M1990 Unspecified osteoarthritis, unspecified site: Secondary | ICD-10-CM | POA: Diagnosis not present

## 2023-10-03 DIAGNOSIS — Z7982 Long term (current) use of aspirin: Secondary | ICD-10-CM | POA: Diagnosis not present

## 2023-10-03 DIAGNOSIS — E162 Hypoglycemia, unspecified: Secondary | ICD-10-CM | POA: Diagnosis not present

## 2023-10-03 DIAGNOSIS — Z791 Long term (current) use of non-steroidal anti-inflammatories (NSAID): Secondary | ICD-10-CM | POA: Diagnosis not present

## 2023-10-03 DIAGNOSIS — E11649 Type 2 diabetes mellitus with hypoglycemia without coma: Secondary | ICD-10-CM | POA: Diagnosis not present

## 2023-10-03 DIAGNOSIS — E7800 Pure hypercholesterolemia, unspecified: Secondary | ICD-10-CM | POA: Diagnosis not present

## 2023-10-03 DIAGNOSIS — R29818 Other symptoms and signs involving the nervous system: Secondary | ICD-10-CM | POA: Diagnosis not present

## 2023-10-03 DIAGNOSIS — K219 Gastro-esophageal reflux disease without esophagitis: Secondary | ICD-10-CM | POA: Diagnosis not present

## 2023-10-03 DIAGNOSIS — Z79899 Other long term (current) drug therapy: Secondary | ICD-10-CM | POA: Diagnosis not present

## 2023-10-03 DIAGNOSIS — Z7985 Long-term (current) use of injectable non-insulin antidiabetic drugs: Secondary | ICD-10-CM | POA: Diagnosis not present

## 2023-10-06 ENCOUNTER — Telehealth: Payer: Self-pay

## 2023-10-06 NOTE — Transitions of Care (Post Inpatient/ED Visit) (Signed)
 10/06/2023  Name: Katherine Costa MRN: [id number] DOB: 24-Mar-1950  Today's TOC FU Call Status: Today's TOC FU Call Status:: Successful TOC FU Call Completed TOC FU Call Complete Date: 10/06/23 Patient's Name and Date of Birth confirmed.  Transition Care Management Follow-up Telephone Call Date of Discharge: 10/05/23 Discharge Facility: Other [facility]) Name of Other (Non[facility]) Discharge Facility: [facility] Type of Discharge: Inpatient Admission Primary Inpatient Discharge Diagnosis:: hypoglycemia How have you been since you were released from the hospital?: Better Any questions or concerns?: No  Items Reviewed: Did you receive and understand the discharge instructions provided?: Yes Medications obtained,verified, and reconciled?: Yes (Medications Reviewed) Any new allergies since your discharge?: No Dietary orders reviewed?: Yes Do you have support at home?: Yes People in Home [RPT]: spouse  Medications Reviewed Today: Medications Reviewed Today     Reviewed by Emmitt Pan, LPN (Licensed Practical Nurse) on 10/06/23 at 0912  Med List Status: <None>   Medication Order Taking? Sig Documenting Provider Last Dose Status Informant  aspirin 81 MG EC tablet [id number] Yes Take 81 mg by mouth daily. Swallow whole. [provider]  Active   augmented betamethasone  dipropionate (DIPROLENE ) 0.05 % ointment [id number] Yes Apply topically 3 (three) times daily as needed. rash Kassie Mallick, MD  Active   celecoxib  (CELEBREX ) 200 MG capsule [id number] Yes Take 1 capsule (200 mg total) by mouth daily. Ozell Heron CHRISTELLA, MD  Active   clotrimazole -betamethasone  (LOTRISONE ) cream [id number] Yes APPLY TOPICALLY THREE TIMES A DAY AS NEEDED FOR RASH. Koberlein, Junell C, MD  Active   Continuous Blood Gluc Receiver (DEXCOM G7 RECEIVER) DEVI [id number] Yes 1 receiver once. Ozell Heron CHRISTELLA, MD  Active   Continuous Glucose Sensor [facility] G7 SENSOR) [place] [id number] Yes APPLY 1 SENSOR  EVERY 10 DAYS Ozell Heron CHRISTELLA, MD  Active   insulin  aspart (FIASP  FLEXTOUCH) 100 UNIT/ML FlexTouch Pen [id number] Yes Inject into the skin as directed. [provider]  Active   insulin  degludec (TRESIBA  FLEXTOUCH) 100 UNIT/ML FlexTouch Pen [id number] Yes Inject 66 Units into the skin at bedtime. Sample  exp: 03/2021 and lot [id number]. Ozell Heron CHRISTELLA, MD  Active   lisinopril -hydrochlorothiazide  (ZESTORETIC ) 10-12.5 MG tablet [id number] Yes Take 1 tablet by mouth daily. Ozell Heron CHRISTELLA, MD  Active   omeprazole  [facility]) 40 MG capsule [id number] Yes Take 1 capsule (40 mg total) by mouth daily. Ozell Heron CHRISTELLA, MD  Active   ondansetron  (ZOFRAN ) 4 MG tablet [id number] Yes Take 1 tablet (4 mg total) by mouth every 8 (eight) hours as needed for nausea or vomiting. Legrand Victory LITTIE DOUGLAS, MD  Active   oxybutynin  (DITROPAN ) 5 MG tablet [id number] Yes Take 1 tablet (5 mg total) by mouth at bedtime.  Patient taking differently: Take 5 mg by mouth at bedtime. Taking every other day at bedtime   Ozell Heron CHRISTELLA, MD  Active            Med Note JANEAN, JON DEL   Fri Sep 17, 2023  9:33 AM)    rosuvastatin  (CRESTOR ) 20 MG tablet [id number] Yes TAKE 1 TABLET BY MOUTH DAILY Ozell Heron CHRISTELLA, MD  Active   Semaglutide  (RYBELSUS ) 14 MG TABS [id number] Yes Take 1 tablet (14 mg total) by mouth daily. Bill voucher not insurance BIN [phone] PCN [employer] [id number] ID [id number] Ozell Heron CHRISTELLA, MD  Active             Home Care and Equipment/Supplies: Were Home Health Services Ordered?: NA Any new equipment or medical  supplies ordered?: NA  Functional Questionnaire: Do you need assistance with bathing/showering or dressing?: No Do you need assistance with meal preparation?: No Do you need assistance with eating?: No Do you have difficulty maintaining continence: No Do you need assistance with getting out of bed/getting out of a chair/moving?: No Do you have difficulty managing or taking  your medications?: No  Follow up appointments reviewed: PCP Follow-up appointment confirmed?: Yes Date of PCP follow-up appointment?: 10/11/23 Follow-up Provider: [facility] Follow-up appointment confirmed?: NA Do you need transportation to your follow-up appointment?: No Do you understand care options if your condition(s) worsen?: Yes-patient verbalized understanding    SIGNATURE Julian Lemmings, LPN [facility] Nurse Health Advisor Direct Dial  [phone]

## 2023-10-08 ENCOUNTER — Telehealth: Payer: Self-pay

## 2023-10-08 NOTE — Progress Notes (Signed)
   10/08/2023  Patient ID: Katherine Costa, female   DOB: [birth year], 73 y.o.   MRN: [id number]  Pharmacy Quality Measure Review  This patient is appearing on a report for being at risk of failing the adherence measure for cholesterol (statin) medications this calendar year.   Medication: Rosuvastatin  Last fill date: 10/07/23 for 100 day supply  Left voicemail for patient to return my call at their convenience.  Jon VEAR Lindau, PharmD Clinical Pharmacist [phone]

## 2023-10-10 ENCOUNTER — Other Ambulatory Visit: Payer: Self-pay | Admitting: Family Medicine

## 2023-10-10 DIAGNOSIS — E109 Type 1 diabetes mellitus without complications: Secondary | ICD-10-CM

## 2023-10-11 ENCOUNTER — Inpatient Hospital Stay: Admitting: Family Medicine

## 2023-10-13 ENCOUNTER — Encounter: Payer: Self-pay | Admitting: Family Medicine

## 2023-10-13 ENCOUNTER — Ambulatory Visit (INDEPENDENT_AMBULATORY_CARE_PROVIDER_SITE_OTHER): Admitting: Family Medicine

## 2023-10-13 VITALS — BP 136/62 | HR 77 | Temp 98.2°F | Ht 63.0 in | Wt 239.1 lb

## 2023-10-13 DIAGNOSIS — E162 Hypoglycemia, unspecified: Secondary | ICD-10-CM | POA: Diagnosis not present

## 2023-10-13 MED ORDER — GLUCAGON 1 MG/0.2ML ~~LOC~~ SOAJ: 0.2000 mL | SUBCUTANEOUS | 5 refills | Status: AC | PRN

## 2023-10-13 NOTE — Progress Notes (Signed)
 Established Patient Office Visit  Subjective   Patient ID: Katherine Costa, female    DOB: 1950/12/03  Age: 73 y.o. MRN: [id number]  Chief Complaint  Patient presents with   Hospitalization Follow-up    HPI Discussed the use of AI scribe software for clinical note transcription with the patient, who gave verbal consent to proceed.  History of Present Illness   Katherine Costa is a 73 year old female with diabetes who presents with a recent episode of hypoglycemia.  She experienced a significant episode of hypoglycemia, resulting in unresponsiveness and requiring emergency medical attention. Her husband called an ambulance, and she was admitted to [facility] for three days. During her stay, she underwent a CT scan and an MRI of the brain. The hypoglycemic event occurred after a busy day when she did not consume enough food to match her insulin  dosage.  During her hospital stay, her blood pressure was recorded as high as 240/180 mmHg, and her blood sugar levels were elevated due to incorrect insulin  administration. Since discharge, she has experienced fluctuating blood sugar levels, with a recent reading of 270 mg/dL after not taking her long-acting insulin , Tresiba , due to fear of another hypoglycemic episode.  She also reports a recent gastrointestinal illness that began the Sunday following her hospital discharge, characterized by vomiting yellow bile twice on Sunday and Monday. She attributes this to a stomach bug, possibly contracted from her grandchildren. The vomiting has since resolved, with the last episode occurring at 1 PM the previous day. She continues to have regular bowel movements.  Her current medications include Tresiba , which she takes at 66 units, but she has been adjusting her dosage due to recent illness and hypoglycemia concerns. She also uses a sliding scale for her insulin  management. She has a history of type 1 diabetes and has experienced similar  episodes in the past.       Current Outpatient Medications  Medication Instructions   aspirin EC 81 mg, Daily   augmented betamethasone  dipropionate (DIPROLENE ) 0.05 % ointment Topical, 3 times daily PRN, rash   celecoxib  (CELEBREX ) 200 mg, Oral, Daily   clotrimazole -betamethasone  (LOTRISONE ) cream APPLY TOPICALLY THREE TIMES A DAY AS NEEDED FOR RASH.   Continuous Blood Gluc Receiver (DEXCOM G7 RECEIVER) DEVI 1 receiver once.   Continuous Glucose Sensor (DEXCOM G7 SENSOR) MISC APPLY 1 SENSOR EVERY 10 DAYS   Glucagon  1 MG/0.2ML SOAJ 0.2 mLs, Subcutaneous, As needed   insulin  aspart (FIASP  FLEXTOUCH) 100 UNIT/ML FlexTouch Pen As directed   lisinopril -hydrochlorothiazide  (ZESTORETIC ) 10-12.5 MG tablet 1 tablet, Oral, Daily   omeprazole  (PRILOSEC) 40 mg, Oral, Daily   ondansetron  (ZOFRAN ) 4 mg, Oral, Every 8 hours PRN   oxybutynin  (DITROPAN ) 5 mg, Oral, Daily at bedtime   rosuvastatin  (CRESTOR ) 20 mg, Oral, Daily   Rybelsus  14 mg, Oral, Daily, Bill voucher not insurance BIN [phone] PCN [employer] [id number] ID [id number]   Tresiba  FlexTouch 66 Units, Subcutaneous, Daily at bedtime, Sample  exp: 03/2021 and lot [id number].    Patient Active Problem List   Diagnosis Date Noted   Osteoarthritis of multiple joints 08/19/2021   Urinary incontinence 06/24/2018   Gastroesophageal reflux disease 02/11/2018   Primary localized osteoarthritis of left knee 09/13/2015   Pain in joint, shoulder region 06/01/2014   Morbid obesity (HCC) 04/21/2013   Encounter for long-term (current) use of other medications 09/02/2011   Disorder of liver 03/17/2010   ASYMPTOMATIC POSTMENOPAUSAL STATUS 05/21/2009   HERPES LABIALIS 03/13/2009  HYPERCHOLESTEROLEMIA 02/09/2008   Type 1 diabetes mellitus (HCC) 08/24/2006   Essential hypertension 08/24/2006     Review of Systems  All other systems reviewed and are negative.     Objective:     BP 136/62   Pulse 77   Temp 98.2 F (36.8 C) (Oral)   Ht 5' 3  (1.6 m)   Wt 239 lb 1.6 oz (108.5 kg)   SpO2 99%   BMI 42.35 kg/m    Physical Exam Vitals reviewed.  Constitutional:      Appearance: Normal appearance. She is morbidly obese.  Cardiovascular:     Rate and Rhythm: Normal rate and regular rhythm.     Heart sounds: Normal heart sounds. No murmur heard. Pulmonary:     Effort: Pulmonary effort is normal.     Breath sounds: Normal breath sounds. No wheezing.  Abdominal:     General: Bowel sounds are normal.  Neurological:     Mental Status: She is alert and oriented to person, place, and time. Mental status is at baseline.  Psychiatric:        Mood and Affect: Mood normal.        Behavior: Behavior normal.      No results found for any visits on 10/13/23.    The 10-year ASCVD risk score (Arnett DK, et al., 2019) is: 26.8%    Assessment & Plan:  Hypoglycemia -     Glucagon ; Inject 0.2 mLs into the skin as needed (for sugar under 60 with symptoms.).  Dispense: 0.2 mL; Refill: 5   Assessment and Plan    Type 1 diabetes mellitus with recent hypoglycemia and now with rebound hyperglycemia Recent hypoglycemic coma due to inadequate food intake and current hyperglycemia from missed Tresiba  dose. Vomiting and reduced food intake contributed to fluctuations. - Resume all diabetes medications as prescribed, including Tresiba  and sliding scale insulin . - Prescribe glucagon  for emergency use if blood sugar drops below 60 mg/dL with symptoms. - Instruct to reduce Tresiba  dose to 33 units if vomiting or not eating regularly. - Advise to eat small, consistent meals with the same amount of carbohydrates to stabilize blood sugar levels. - Encourage carrying snacks like hard candy or peanut butter crackers for emergencies.  Hypertension Blood pressure was extremely high during hospitalization but is currently well-controlled. - Continue current antihypertensive regimen.        No follow-ups on file.    Heron CHRISTELLA Sharper, MD

## 2023-10-13 NOTE — Patient Instructions (Addendum)
 When you are vomiting/ not eating regularly, reduce your dose of tresiba  to 33 units at night (1/2 dose)   Eat small meals every few hours - eat the same amount of carbs with each meal.

## 2023-10-26 ENCOUNTER — Other Ambulatory Visit (HOSPITAL_COMMUNITY): Payer: Self-pay

## 2023-10-26 ENCOUNTER — Telehealth: Payer: Self-pay

## 2023-10-26 DIAGNOSIS — R310 Gross hematuria: Secondary | ICD-10-CM | POA: Diagnosis not present

## 2023-10-26 NOTE — Telephone Encounter (Signed)
 Pharmacy Patient Advocate Encounter   Received notification from Onbase that prior authorization for Dexcom G7 sensor is required/requested.   Insurance verification completed.   The patient is insured through [facility] .   Per test claim: PA required; PA submitted to above mentioned insurance via Latent Key/confirmation #/EOC [facility] Status is pending

## 2023-10-29 ENCOUNTER — Other Ambulatory Visit (HOSPITAL_COMMUNITY): Payer: Self-pay

## 2023-10-29 NOTE — Telephone Encounter (Signed)
 Pharmacy Patient Advocate Encounter  Received notification from [employer] that Prior Authorization for Dexcom G7 sensor has been APPROVED from 10/28/23 to 01/26/24. Ran test claim, Copay is $0.00 for 100 day supply. This test claim was processed through [facility]- copay amounts may vary at other pharmacies due to pharmacy/plan contracts, or as the patient moves through the different stages of their insurance plan.   PA #/Case ID/Reference #: # [id number]

## 2023-11-05 ENCOUNTER — Other Ambulatory Visit

## 2023-11-06 ENCOUNTER — Other Ambulatory Visit: Payer: Self-pay | Admitting: Family Medicine

## 2023-11-06 DIAGNOSIS — M150 Primary generalized (osteo)arthritis: Secondary | ICD-10-CM

## 2023-11-16 ENCOUNTER — Telehealth: Payer: Self-pay

## 2023-11-16 NOTE — Progress Notes (Signed)
   11/16/2023  Patient ID: Katherine Costa, female   DOB: 22-Mar-1950, 73 y.o.   MRN: [id number]  Attempted to contact patient to discuss Rybelsus  PAP program ending and to reschedule cancelled f/u appt. Left HIPAA compliant message for patient to return my call at their convenience.   Jon VEAR Lindau, PharmD Clinical Pharmacist [phone]

## 2023-12-04 ENCOUNTER — Other Ambulatory Visit: Payer: Self-pay | Admitting: Family Medicine

## 2023-12-04 DIAGNOSIS — K219 Gastro-esophageal reflux disease without esophagitis: Secondary | ICD-10-CM

## 2023-12-04 DIAGNOSIS — I10 Essential (primary) hypertension: Secondary | ICD-10-CM

## 2023-12-06 ENCOUNTER — Other Ambulatory Visit: Payer: Self-pay | Admitting: Family Medicine

## 2023-12-06 ENCOUNTER — Telehealth: Payer: Self-pay

## 2023-12-06 ENCOUNTER — Other Ambulatory Visit

## 2023-12-06 DIAGNOSIS — E109 Type 1 diabetes mellitus without complications: Secondary | ICD-10-CM

## 2023-12-06 MED ORDER — TIRZEPATIDE 2.5 MG/0.5ML ~~LOC~~ SOAJ: 2.5000 mg | SUBCUTANEOUS | 1 refills | Status: DC

## 2023-12-06 NOTE — Telephone Encounter (Signed)
 Spoke with pt gave consent to do pap online and faxed provider portion today of Novo Nordisk Tresiba ,fiast and pen needles.

## 2023-12-06 NOTE — Addendum Note (Signed)
 Addended by: LIONELL JON DEL on: 12/06/2023 09:45 AM   Modules accepted: Orders

## 2023-12-06 NOTE — Progress Notes (Signed)
 12/06/2023 Name: Katherine Costa MRN: [id number] DOB: [birth year]  Chief Complaint  Patient presents with   Medication Management   Diabetes    Katherine Costa is a 73 y.o. year old female who presented for a telephone visit.   They were referred to the pharmacist by their PCP for assistance in managing diabetes and complex medication management.    Subjective:  Care Team: Primary Care Provider: Ozell Heron CHRISTELLA, Costa   Medication Access/Adherence  Current Pharmacy:  [facility] [phone] - [place], [place] - [phone] AT [facility] [address] [place] [address] Phone: [phone] Fax: [phone]  [facility] - [place], [place] - [address] [place] [address] Phone: [phone] Fax: [phone]   Patient reports affordability concerns with their medications: No  Patient reports access/transportation concerns to their pharmacy: No  Patient reports adherence concerns with their medications:  No     Diabetes:  Current medications: Rybelsus  14mg , Tresiba  30 units BID (self made this change), Fiasp  SS Medications tried in the past: None  Using Dexcom G7 Sensors  Date of Download:    BG has improved since last PCP visit, less low BG events.   Patient denies hypoglycemic s/sx including dizziness, shakiness, sweating. Patient denies hyperglycemic symptoms including polyuria, polydipsia, polyphagia, nocturia, neuropathy, blurred vision.   Current medication access support: Rybelsus , Tresiba , Fiasp  through [employer]  Patient presents as a mixed type 1/type 2 diabetic, treated with both insulin  and type 2 DM options  She requests to stop Rybelsus  today and start Mounjaro, reports she checked with insurance and this would be affordable for her   Objective:  Lab Results  Component Value Date   HGBA1C 8.1 (H) 10/01/2023    Lab Results  Component Value Date    CREATININE 0.78 04/23/2023   BUN 17 04/23/2023   NA 138 04/23/2023   K 4.2 04/23/2023   CL 103 04/23/2023   CO2 29 04/23/2023    Lab Results  Component Value Date   CHOL 204 (H) 04/23/2023   HDL 80.20 04/23/2023   LDLCALC 108 (H) 04/23/2023   LDLDIRECT 110.6 09/02/2011   TRIG 80.0 04/23/2023   CHOLHDL 3 04/23/2023    Medications Reviewed Today     Reviewed by Katherine Costa, RPH (Pharmacist) on 12/06/23 at [phone]  Med List Status: <None>   Medication Order Taking? Sig Documenting Provider Last Dose Status Informant  aspirin 81 MG EC tablet [id number]  Take 81 mg by mouth daily. Swallow whole. Provider, Historical, Costa  Active   augmented betamethasone  dipropionate (DIPROLENE ) 0.05 % ointment [id number]  Apply topically 3 (three) times daily as needed. rash Katherine Mallick, Costa  Active   celecoxib  (CELEBREX ) 200 MG capsule [id number]  TAKE 1 CAPSULE BY MOUTH DAILY Katherine Heron CHRISTELLA, Costa  Active   clotrimazole -betamethasone  (LOTRISONE ) cream [id number]  APPLY TOPICALLY THREE TIMES A DAY AS NEEDED FOR RASH. Katherine Costa  Active   Continuous Blood Gluc Receiver (DEXCOM G7 [place]) [place] [id number]  1 receiver once. Katherine Heron CHRISTELLA, Costa  Active   Continuous Glucose Sensor [facility] G7 SENSOR) [place] [id number]  APPLY 1 SENSOR EVERY 10 DAYS Katherine Heron CHRISTELLA, Costa  Active   Glucagon  1 MG/0.2ML Katherine Costa [id number]  Inject 0.2 mLs into the skin as needed (for sugar under 60 with symptoms.). Katherine Heron CHRISTELLA, Costa  Active   insulin  aspart (FIASP  FLEXTOUCH) 100 UNIT/ML  FlexTouch Pen [id number]  Inject into the skin as directed. Provider, Historical, Costa  Active   insulin  degludec (TRESIBA  FLEXTOUCH) 100 UNIT/ML FlexTouch Pen [id number] Yes Inject 66 Units into the skin at bedtime. Sample  exp: 03/2021 and lot [id number].  Patient taking differently: Inject 30 Units into the skin 2 (two) times daily.   Katherine Heron HERO, Costa  Active   lisinopril -hydrochlorothiazide  (ZESTORETIC ) 10-12.5 MG tablet [id number]   TAKE 1 TABLET BY MOUTH DAILY Katherine Heron HERO, Costa  Active   omeprazole  [facility]) 40 MG capsule [id number]  TAKE 1 CAPSULE BY MOUTH DAILY Katherine Heron HERO, Costa  Active   ondansetron  (ZOFRAN ) 4 MG tablet [id number]  Take 1 tablet (4 mg total) by mouth every 8 (eight) hours as needed for nausea or vomiting. Katherine Victory LITTIE DOUGLAS, Costa  Active   oxybutynin  (DITROPAN ) 5 MG tablet [id number]  Take 1 tablet (5 mg total) by mouth at bedtime.  Patient taking differently: Take 5 mg by mouth at bedtime. Taking every other day at bedtime   Katherine Heron HERO, Costa  Active            Med Note JANEAN, Katherine Costa   Fri Sep 17, 2023  9:33 AM)    rosuvastatin  (CRESTOR ) 20 MG tablet [id number]  TAKE 1 TABLET BY MOUTH DAILY Katherine Heron HERO, Costa  Active     Discontinued 12/06/23 0914   tirzepatide (MOUNJARO) 2.5 MG/0.5ML Pen [id number]  Inject 2.5 mg into the skin once a week. Katherine Heron HERO, Costa  Active               Assessment/Plan:   Diabetes: - Currently uncontrolled  - Reviewed long term cardiovascular and renal outcomes of uncontrolled blood sugar - Reviewed goal A1c, goal fasting, and goal 2 hour post prandial glucose - Reviewed dietary modifications including low carb diet - STOP Rybelsus , START Mounjaro 2.5mg  once weekly as prescribed by Costa -Continue other medication therapy, counseled to reach out sooner than scheduled f/u if lows occur   Follow Up Plan:1 month  Katherine Costa, PharmD Clinical Pharmacist [phone]

## 2023-12-27 ENCOUNTER — Telehealth: Payer: Self-pay | Admitting: *Deleted

## 2023-12-27 NOTE — Telephone Encounter (Signed)
 Per RPH Jon pt has been approved Novo Nordisk Tresiba  and Fiasp  thru 01/25/2025

## 2023-12-27 NOTE — Telephone Encounter (Signed)
 Copied from CRM #[id number]. Topic: General - Other >> Dec 27, 2023  9:33 AM Rosina BIRCH wrote: Reason for RMF:[id number] called stating she need to speak to [facility]  (pharmacist) in regards to her medication   [phone]

## 2023-12-27 NOTE — Telephone Encounter (Signed)
 Patient wanted an update on [employer] application. Contacted company. Received notification from [employer] that patient's renewal application for Tresiba /Fiasp /Pen Needles has been approved through 01/25/2025. Patient notified.

## 2023-12-29 NOTE — Telephone Encounter (Signed)
 Received approval letter from [employer] on Tresiba /fiasp /pen needles thru 01/25/2025.approval letter index.

## 2024-01-02 ENCOUNTER — Other Ambulatory Visit: Payer: Self-pay | Admitting: Family Medicine

## 2024-01-02 DIAGNOSIS — N3946 Mixed incontinence: Secondary | ICD-10-CM

## 2024-01-05 ENCOUNTER — Other Ambulatory Visit

## 2024-01-05 DIAGNOSIS — E109 Type 1 diabetes mellitus without complications: Secondary | ICD-10-CM

## 2024-01-05 NOTE — Progress Notes (Signed)
 01/05/2024 Name: Katherine Costa MRN: [id number] DOB: 04-Apr-1950  Chief Complaint  Patient presents with   Medication Management   Diabetes    Katherine Costa is a 73 y.o. year old female who presented for a telephone visit.   They were referred to the pharmacist by their PCP for assistance in managing diabetes and complex medication management.    Subjective:  Care Team: Primary Care Provider: Ozell Heron CHRISTELLA, MD   Medication Access/Adherence  Current Pharmacy:  [facility] [phone] - [place], [place] - [phone] AT [facility] [address]AST [address] [place] [address] Phone: [phone] Fax: [phone]  [facility] Delivery - [place], [place] - [address] [place] [address] Phone: [phone] Fax: [phone]   Patient reports affordability concerns with their medications: No  Patient reports access/transportation concerns to their pharmacy: No  Patient reports adherence concerns with their medications:  No     Diabetes:  Current medications: Mounjaro  2.5mg , Tresiba  30 units BID (self made this change), Fiasp  SS Medications tried in the past: None  Using Dexcom G7 Sensors  Date of Download:    BG has improved since last PCP visit, less low BG events.   Tolerating mounjaro  well, has 2 boxes remaining on hand  Patient denies hypoglycemic s/sx including dizziness, shakiness, sweating. Patient denies hyperglycemic symptoms including polyuria, polydipsia, polyphagia, nocturia, neuropathy, blurred vision.   Current medication access support:  Tresiba , Fiasp  through Novo  Patient presents as a mixed type 1/type 2 diabetic, treated with both insulin  and type 2 DM options    Objective:  Lab Results  Component Value Date   HGBA1C 8.1 (H) 10/01/2023    Lab Results  Component Value Date   CREATININE 0.78 04/23/2023   BUN 17 04/23/2023   NA 138 04/23/2023   K 4.2  04/23/2023   CL 103 04/23/2023   CO2 29 04/23/2023    Lab Results  Component Value Date   CHOL 204 (H) 04/23/2023   HDL 80.20 04/23/2023   LDLCALC 108 (H) 04/23/2023   LDLDIRECT 110.6 09/02/2011   TRIG 80.0 04/23/2023   CHOLHDL 3 04/23/2023    Medications Reviewed Today     Reviewed by Lionell Jon DEL, RPH (Pharmacist) on 01/05/24 at 0957  Med List Status: <None>   Medication Order Taking? Sig Documenting Provider Last Dose Status Informant  aspirin 81 MG EC tablet [id number]  Take 81 mg by mouth daily. Swallow whole. [provider]  Active   augmented betamethasone  dipropionate (DIPROLENE ) 0.05 % ointment [id number]  Apply topically 3 (three) times daily as needed. rash Kassie Mallick, MD  Active   celecoxib  (CELEBREX ) 200 MG capsule [id number]  TAKE 1 CAPSULE BY MOUTH DAILY Ozell Heron CHRISTELLA, MD  Active   clotrimazole -betamethasone  (LOTRISONE ) cream [id number]  APPLY TOPICALLY THREE TIMES A DAY AS NEEDED FOR RASH. Koberlein, Junell C, MD  Active   Continuous Blood Gluc Receiver (DEXCOM G7 [place]) [place] [id number]  1 receiver once. Ozell Heron CHRISTELLA, MD  Active   Continuous Glucose Sensor [facility] G7 SENSOR) [place] [id number]  APPLY 1 SENSOR EVERY 10 DAYS Ozell Heron CHRISTELLA, MD  Active   Glucagon  1 MG/0.2ML EMMANUEL [id number]  Inject 0.2 mLs into the skin as needed (for sugar under 60 with symptoms.). Ozell Heron CHRISTELLA, MD  Active   insulin  aspart (FIASP  FLEXTOUCH) 100 UNIT/ML FlexTouch Pen [id number]  Inject into the skin as directed. [provider]  Active   insulin  degludec (TRESIBA ) 200 UNIT/ML FlexTouch Pen [id number]  Inject 30 Units into the skin 2 (two) times daily. Getting via Novo PAP [provider]  Active   lisinopril -hydrochlorothiazide  (ZESTORETIC ) 10-12.5 MG tablet [id number]  TAKE 1 TABLET BY MOUTH DAILY Ozell Heron HERO, MD  Active   omeprazole  (PRILOSEC) 40 MG capsule [id number]  TAKE 1 CAPSULE BY MOUTH DAILY Ozell Heron HERO, MD  Active    ondansetron  (ZOFRAN ) 4 MG tablet [id number]  Take 1 tablet (4 mg total) by mouth every 8 (eight) hours as needed for nausea or vomiting. Legrand Victory LITTIE DOUGLAS, MD  Active   oxybutynin  (DITROPAN ) 5 MG tablet [id number]  TAKE 1 TABLET BY MOUTH AT  BEDTIME Ozell Heron HERO, MD  Active   rosuvastatin  (CRESTOR ) 20 MG tablet [id number]  TAKE 1 TABLET BY MOUTH DAILY Ozell Heron HERO, MD  Active   tirzepatide  (MOUNJARO ) 2.5 MG/0.5ML Pen [id number]  Inject 2.5 mg into the skin once a week. Ozell Heron HERO, MD  Active               Assessment/Plan:   Diabetes: - Currently uncontrolled  - Reviewed long term cardiovascular and renal outcomes of uncontrolled blood sugar - Reviewed goal A1c, goal fasting, and goal 2 hour post prandial glucose - Reviewed dietary modifications including low carb diet - STOP using tresiba  twice daily to account for low BG, long acting insulin  designed to be used once daily. Inject 30 units at bedtime. Increase by 2 units every 4 days until BG avg 160 or until you hit 40 units daily. Reach out sooner than sch f/u if low BG continue   Follow Up Plan:1 month  Jon VEAR Lindau, PharmD Clinical Pharmacist [phone]

## 2024-02-07 ENCOUNTER — Other Ambulatory Visit (INDEPENDENT_AMBULATORY_CARE_PROVIDER_SITE_OTHER)

## 2024-02-07 DIAGNOSIS — E109 Type 1 diabetes mellitus without complications: Secondary | ICD-10-CM

## 2024-02-07 MED ORDER — TIRZEPATIDE 5 MG/0.5ML ~~LOC~~ SOAJ: 5.0000 mg | SUBCUTANEOUS | 2 refills | Status: AC

## 2024-02-07 NOTE — Progress Notes (Signed)
 "  02/07/2024 Name: Katherine Costa MRN: [id number] DOB: 31-Jul-1950  Chief Complaint  Patient presents with   Medication Management   Diabetes    Katherine Costa is a 74 y.o. year old female who presented for a telephone visit.   They were referred to the pharmacist by their PCP for assistance in managing diabetes and complex medication management.    Subjective:  Care Team: Primary Care Provider: Ozell Heron CHRISTELLA, MD   Medication Access/Adherence  Current Pharmacy:  [facility] [phone] - [place], [place] - [phone] AT [facility] [address]AST [address] [facility] [address] & [address] [place] [address] Phone: [phone] Fax: [phone]  [facility] Delivery - [place], [place] - [address] [place]  [address] Phone: [phone] Fax: [phone]   Patient reports affordability concerns with their medications: No  Patient reports access/transportation concerns to their pharmacy: No  Patient reports adherence concerns with their medications:  No     Diabetes:  Current medications: Mounjaro  2.5mg , Tresiba  32 units daily, Fiasp  SS (pt unsure of the exact sliding scale she has been following but gives anywhere from 12-17 units fiasp , sometimes has lows. Reads out a sliding scale but unclear if she has been following  Medications tried in the past: None  Using Dexcom G7 Sensors     Still having low BG event and significant elevations, that she partially contributes to holiday season [employer] mounjaro  well, would like to go up to 5mg  dose  Patient denies hypoglycemic s/sx including dizziness, shakiness, sweating. Patient denies hyperglycemic symptoms including polyuria, polydipsia, polyphagia, nocturia, neuropathy, blurred vision.   Current medication access support:  Tresiba , Fiasp  through Novo  Patient presents as a mixed type 1/type 2 diabetic, treated with both insulin  and type 2 DM  options    Objective:  Lab Results  Component Value Date   HGBA1C 8.1 (H) 10/01/2023    Lab Results  Component Value Date   CREATININE 0.78 04/23/2023   BUN 17 04/23/2023   NA 138 04/23/2023   K 4.2 04/23/2023   CL 103 04/23/2023   CO2 29 04/23/2023    Lab Results  Component Value Date   CHOL 204 (H) 04/23/2023   HDL 80.20 04/23/2023   LDLCALC 108 (H) 04/23/2023   LDLDIRECT 110.6 09/02/2011   TRIG 80.0 04/23/2023   CHOLHDL 3 04/23/2023    Medications Reviewed Today     Reviewed by Lionell Jon DEL, RPH (Pharmacist) on 02/07/24 at 2014  Med List Status: <None>   Medication Order Taking? Sig Documenting Provider Last Dose Status Informant  aspirin 81 MG EC tablet [id number]  Take 81 mg by mouth daily. Swallow whole. [provider]  Active   augmented betamethasone  dipropionate (DIPROLENE ) 0.05 % ointment [id number]  Apply topically 3 (three) times daily as needed. rash Kassie Mallick, MD  Active   celecoxib  (CELEBREX ) 200 MG capsule [id number]  TAKE 1 CAPSULE BY MOUTH DAILY Ozell Heron CHRISTELLA, MD  Active   clotrimazole -betamethasone  (LOTRISONE ) cream [id number]  APPLY TOPICALLY THREE TIMES A DAY AS NEEDED FOR RASH. Koberlein, Junell C, MD  Active   Continuous Blood Gluc Receiver (DEXCOM G7 [place]) [place] [id number]  1 receiver once. Ozell Heron CHRISTELLA, MD  Active   Continuous Glucose Sensor [facility] G7 SENSOR) [place] [id number]  APPLY 1 SENSOR EVERY 10 DAYS Ozell Heron CHRISTELLA, MD  Active   Glucagon  1 MG/0.2ML EMMANUEL [id number]  Inject 0.2 mLs into the  skin as needed (for sugar under 60 with symptoms.). Ozell Heron HERO, MD  Active   insulin  aspart (FIASP  FLEXTOUCH) 100 UNIT/ML FlexTouch Pen [id number]  Inject into the skin as directed. [provider]  Active   insulin  degludec (TRESIBA ) 200 UNIT/ML FlexTouch Pen [id number]  Inject 30 Units into the skin 2 (two) times daily. Getting via Novo PAP [provider]  Active   lisinopril -hydrochlorothiazide   (ZESTORETIC ) 10-12.5 MG tablet [id number]  TAKE 1 TABLET BY MOUTH DAILY Ozell Heron HERO, MD  Active   omeprazole  (PRILOSEC) 40 MG capsule [id number]  TAKE 1 CAPSULE BY MOUTH DAILY Ozell Heron HERO, MD  Active   ondansetron  (ZOFRAN ) 4 MG tablet [id number]  Take 1 tablet (4 mg total) by mouth every 8 (eight) hours as needed for nausea or vomiting. Legrand Victory LITTIE DOUGLAS, MD  Active   oxybutynin  (DITROPAN ) 5 MG tablet [id number]  TAKE 1 TABLET BY MOUTH AT  BEDTIME Ozell Heron HERO, MD  Active   rosuvastatin  (CRESTOR ) 20 MG tablet [id number]  TAKE 1 TABLET BY MOUTH DAILY Ozell Heron HERO, MD  Active   tirzepatide  (MOUNJARO ) 2.5 MG/0.5ML Pen [id number]  Inject 2.5 mg into the skin once a week. Ozell Heron HERO, MD  Active               Assessment/Plan:   Diabetes: - Currently uncontrolled  - Reviewed long term cardiovascular and renal outcomes of uncontrolled blood sugar - Reviewed goal A1c, goal fasting, and goal 2 hour post prandial glucose - Reviewed dietary modifications including low carb diet - INCREASE mounjaro  to 5mg  once weekly -Continue tresiba  32 units daily. -Use fiasp  on following SS as you read out this morning    - BG <100: 0 units; 100-120 - 2 units; 121-140 - 3 units; 141-199 4 units; 200-249 5 units; 250-299 6 units; 300-349 7 units; 350-400 8 units; >400 10 units, call doctor   Follow Up Plan: 2 weeks  Jon VEAR Lindau, PharmD Clinical Pharmacist [phone]  "

## 2024-02-21 ENCOUNTER — Other Ambulatory Visit

## 2024-02-23 ENCOUNTER — Other Ambulatory Visit

## 2024-02-23 DIAGNOSIS — E109 Type 1 diabetes mellitus without complications: Secondary | ICD-10-CM

## 2024-02-23 NOTE — Progress Notes (Signed)
 "  02/23/2024 Name: Katherine Costa MRN: [id number] DOB: 09/15/1950  Chief Complaint  Patient presents with   Medication Management   Diabetes    Katherine Costa is a 74 y.o. year old female who presented for a telephone visit.   They were referred to the pharmacist by their PCP for assistance in managing diabetes and complex medication management.    Subjective:  Care Team: Primary Care Provider: Ozell Heron CHRISTELLA, MD   Medication Access/Adherence  Current Pharmacy:  [facility] [phone] - [place], [place] - [phone] AT [facility] OF [address]AST [facility] [address] & [address] [place] [address] Phone: [phone] Fax: [phone]  [facility] Delivery - [place], [place] - [address] [place] [address] Phone: [phone] Fax: [phone]   Patient reports affordability concerns with their medications: No  Patient reports access/transportation concerns to their pharmacy: No  Patient reports adherence concerns with their medications:  No     Diabetes:  Current medications: Mounjaro  5mg , Tresiba  38 units daily, Fiasp  SS 7 units PLUS BG <100: 0 units; 100-120 - 2 units; 121-140 - 3 units; 141-199 4 units; 200-249 5 units; 250-299 6 units; 300-349 7 units; 350-400 8 units; >400 10 units, call doctor Medications tried in the past: None  Using Dexcom G7 Sensors   Still having low BG event after meals and elevations throughout the day  Tolerating mounaro 5mg  dose well, just gave herself first injection last week  Patient denies hypoglycemic s/sx including dizziness, shakiness, sweating. Patient denies hyperglycemic symptoms including polyuria, polydipsia, polyphagia, nocturia, neuropathy, blurred vision.   Current medication access support:  Tresiba , Fiasp  through Novo  Patient presents as a mixed type 1/type 2 diabetic, treated with both insulin  and type 2 DM options    Objective:  Lab Results  Component  Value Date   HGBA1C 8.1 (H) 10/01/2023    Lab Results  Component Value Date   CREATININE 0.78 04/23/2023   BUN 17 04/23/2023   NA 138 04/23/2023   K 4.2 04/23/2023   CL 103 04/23/2023   CO2 29 04/23/2023    Lab Results  Component Value Date   CHOL 204 (H) 04/23/2023   HDL 80.20 04/23/2023   LDLCALC 108 (H) 04/23/2023   LDLDIRECT 110.6 09/02/2011   TRIG 80.0 04/23/2023   CHOLHDL 3 04/23/2023    Medications Reviewed Today     Reviewed by Lionell Jon DEL, RPH (Pharmacist) on 02/23/24 at 1320  Med List Status: <None>   Medication Order Taking? Sig Documenting Provider Last Dose Status Informant  aspirin 81 MG EC tablet [id number]  Take 81 mg by mouth daily. Swallow whole. [provider]  Active   augmented betamethasone  dipropionate (DIPROLENE ) 0.05 % ointment [id number]  Apply topically 3 (three) times daily as needed. rash Kassie Mallick, MD  Active   celecoxib  (CELEBREX ) 200 MG capsule [id number]  TAKE 1 CAPSULE BY MOUTH DAILY Ozell Heron CHRISTELLA, MD  Active   clotrimazole -betamethasone  (LOTRISONE ) cream [id number]  APPLY TOPICALLY THREE TIMES A DAY AS NEEDED FOR RASH. Koberlein, Junell C, MD  Active   Continuous Blood Gluc Receiver (DEXCOM G7 [place]) [place] [id number]  1 receiver once. Ozell Heron CHRISTELLA, MD  Active   Continuous Glucose Sensor [facility] G7 SENSOR) [place] [id number]  APPLY 1 SENSOR EVERY 10 DAYS Ozell Heron CHRISTELLA, MD  Active   Glucagon  1 MG/0.2ML EMMANUEL [id number]  Inject 0.2 mLs into the skin as needed (for  sugar under 60 with symptoms.). Ozell Heron HERO, MD  Active   insulin  aspart (FIASP  FLEXTOUCH) 100 UNIT/ML FlexTouch Pen [id number]  Inject into the skin as directed. [provider]  Active   insulin  degludec (TRESIBA ) 200 UNIT/ML FlexTouch Pen [id number]  Inject 30 Units into the skin 2 (two) times daily. Getting via Novo PAP [provider]  Active   lisinopril -hydrochlorothiazide  (ZESTORETIC ) 10-12.5 MG tablet [id number]  TAKE 1 TABLET BY  MOUTH DAILY Ozell Heron HERO, MD  Active   omeprazole  (PRILOSEC) 40 MG capsule [id number]  TAKE 1 CAPSULE BY MOUTH DAILY Ozell Heron HERO, MD  Active   ondansetron  (ZOFRAN ) 4 MG tablet [id number]  Take 1 tablet (4 mg total) by mouth every 8 (eight) hours as needed for nausea or vomiting. Legrand Victory LITTIE DOUGLAS, MD  Active   oxybutynin  (DITROPAN ) 5 MG tablet [id number]  TAKE 1 TABLET BY MOUTH AT  BEDTIME Ozell Heron HERO, MD  Active   rosuvastatin  (CRESTOR ) 20 MG tablet [id number]  TAKE 1 TABLET BY MOUTH DAILY Ozell Heron HERO, MD  Active   tirzepatide  (MOUNJARO ) 5 MG/0.5ML Pen [id number]  Inject 5 mg into the skin once a week. Ozell Heron HERO, MD  Active               Assessment/Plan:   Diabetes: - Currently uncontrolled  - Reviewed long term cardiovascular and renal outcomes of uncontrolled blood sugar - Reviewed goal A1c, goal fasting, and goal 2 hour post prandial glucose - Reviewed dietary modifications including low carb diet  -Increase tresiba  to 40 units daily to account for BG elevates, DECREASE fiasp  to 6 units plus sliding scale to account for lows after meals. Continue mounjaro  5mg    Follow Up Plan: 2 weeks  Jon VEAR Lindau, PharmD Clinical Pharmacist [phone]  "
